# Patient Record
Sex: Female | Born: 1945 | Race: Black or African American | Hispanic: No | Smoking: Never smoker
Health system: Southern US, Community
[De-identification: ages and names within clinical notes are randomized; demographics above are authoritative.]

## PROBLEM LIST (undated history)

## (undated) DIAGNOSIS — J329 Chronic sinusitis, unspecified: Secondary | ICD-10-CM

## (undated) DIAGNOSIS — H409 Unspecified glaucoma: Secondary | ICD-10-CM

## (undated) DIAGNOSIS — J45909 Unspecified asthma, uncomplicated: Secondary | ICD-10-CM

## (undated) DIAGNOSIS — M1990 Unspecified osteoarthritis, unspecified site: Secondary | ICD-10-CM

## (undated) DIAGNOSIS — E785 Hyperlipidemia, unspecified: Secondary | ICD-10-CM

## (undated) DIAGNOSIS — K589 Irritable bowel syndrome without diarrhea: Secondary | ICD-10-CM

## (undated) DIAGNOSIS — I10 Essential (primary) hypertension: Secondary | ICD-10-CM

## (undated) DIAGNOSIS — M810 Age-related osteoporosis without current pathological fracture: Secondary | ICD-10-CM

## (undated) HISTORY — DX: Age-related osteoporosis without current pathological fracture: M81.0

## (undated) HISTORY — PX: TUBAL LIGATION: SHX77

## (undated) HISTORY — DX: Unspecified glaucoma: H40.9

## (undated) HISTORY — DX: Unspecified asthma, uncomplicated: J45.909

## (undated) HISTORY — PX: NASAL SINUS SURGERY: SHX719

## (undated) HISTORY — PX: OTHER SURGICAL HISTORY: SHX169

## (undated) HISTORY — DX: Unspecified osteoarthritis, unspecified site: M19.90

## (undated) HISTORY — DX: Irritable bowel syndrome, unspecified: K58.9

## (undated) HISTORY — PX: CATARACT EXTRACTION: SUR2

## (undated) HISTORY — DX: Hyperlipidemia, unspecified: E78.5

---

## 1999-03-20 DIAGNOSIS — K589 Irritable bowel syndrome without diarrhea: Secondary | ICD-10-CM | POA: Insufficient documentation

## 2002-01-26 DIAGNOSIS — I7300 Raynaud's syndrome without gangrene: Secondary | ICD-10-CM | POA: Insufficient documentation

## 2003-01-08 DIAGNOSIS — F32A Depression, unspecified: Secondary | ICD-10-CM | POA: Insufficient documentation

## 2006-10-02 ENCOUNTER — Ambulatory Visit: Payer: Self-pay | Admitting: Family Medicine

## 2006-10-02 ENCOUNTER — Observation Stay (HOSPITAL_COMMUNITY): Admission: EM | Admit: 2006-10-02 | Discharge: 2006-10-03 | Payer: Self-pay | Admitting: Emergency Medicine

## 2008-01-01 DIAGNOSIS — J45909 Unspecified asthma, uncomplicated: Secondary | ICD-10-CM | POA: Insufficient documentation

## 2010-06-23 NOTE — H&P (Signed)
NAMEMARZETTA, LANZA              ACCOUNT NO.:  [id number]   MEDICAL RECORD NO.:  [id number]          PATIENT TYPE:  OBV   LOCATION:  5727                         FACILITY:  [facility]   PHYSICIAN:  Pearlean Brownie, M.D.DATE OF BIRTH:  1945-03-12   DATE OF ADMISSION:  10/02/2006  DATE OF DISCHARGE:                              HISTORY & PHYSICAL   CHIEF COMPLAINT:  Cough.   HISTORY OF PRESENT ILLNESS:  Patient is a 65 year old female with cough  since June now with fever and a left lower lobe pneumonia.  Patient  complains of excruciating pain this morning on the left lower side and  back.  Yesterday, took Advil and pain was relieved, but the pain has  returned.  She has also been taking Mucinex, Zyrtec, Flonase and  albuterol.  Pain today is not relieved by medications.  She also  complains of headache across her face and touchiness which she  believes to be sinusitis.  This is worse with leaning forward.  Patient  also complains of sore throat and raspy voice.  She denies nausea and  vomiting.  She is taking good p.o. fluids.  The cough has worsened in  the past few weeks.  She was initially coughing up clear sputum which  then became green and then became a brownish red.  She did have a brief  episode of shortness of breath last week which has now resolved.  She  reports heavy breathing this a.m., but no shortness of breath.  She is  not using her albuterol more than usual.  Patient also complains of  recent constipation/diarrhea over the past few weeks as well as some  chest tightness from coughing.  She denies subjective fever (however was  febrile to 103 at [facility] prior to arriving at the [facility]).  At [facility], she was diagnosed with a left lower lobe pneumonia, but was  not hypoxic.  She has a history of anemia and has a hematology  appointment already scheduled in the outpatient setting.  Her primary  care Fayne Mcguffee is currently evaluating her fatigue.   PAST  MEDICAL HISTORY:  Includes:  1. Irritable bowel syndrome.  2. Question of hypertension.  3. Chronic sinusitis.  4. Question of diverticulitis.  5. Asthma.  6. Allergic rhinitis.  Sees an ENT for her sinus issues.   MEDICATIONS:  Include:  1. An allergy shot which she receives weekly.  2. Zyrtec.  3. Nasonex.  4. Albuterol.  5. Hydrochlorothiazide 25 mg by mouth daily.  6. Mucinex.   ALLERGIES:  INCLUDE SULFA MEDICATIONS, ZITHROMAX, AVELOX.   PAST SURGICAL HISTORY:  Includes nose surgery and a bilateral tubal  ligation.   SOCIAL HISTORY:  Patient is a nonsmoker, drinks alcohol socially, does  not use recreational drugs.   REVIEW OF SYSTEMS:  Positive for a weight gain of 12 pounds over the  past few months and abdominal pain consistent with her IBS symptoms.  She also complains of night sweats, blood in her stool, and decreased  energy for the past few months.   PHYSICAL EXAMINATION:  VITAL SIGNS:  Current temperature  99.7, heart  rate 92, respiratory rate 20, blood pressure 108/67, O2 sats 97% on room  air.  GENERAL:  She is in no acute distress and is well appearing.  HEENT:  PERRLA, EOMI.  Throat with mild erythema, no exudate.  There is  nasal drainage visible in the posterior pharynx.  Moist mucous  membranes.  NECK:  No lymphadenopathy.  No thyromegaly.  CARDIOVASCULAR:  Regular rate and rhythm.  S1 and S2.  No rubs, gallops  or murmurs appreciated.  PULMONARY:  Positive for rhonchi and crackles and decreased air movement  in the left lower lobe.  Question faint crackles in right lower lobe.  Patient does not have increased work of breathing and no wheezes.  ABDOMEN:  Soft.  Mildly tender to palpation diffusely.  BACK EXAM:  Positive CVA tenderness to palpation on the left, not on the  right.  EXTREMITIES:  Without edema.  2+ pulses.  NEUROLOGIC EXAM:  Cranial nerves II-XII are intact.  She is oriented x3.  MUSCULOSKELETAL:  5/5 strength in upper and lower  extremities  bilaterally.  SKIN:  Fading contusions on right lower extremity.  Per patient, these  are from antibiotics she recently took.  No rashes or lesions elsewhere.  GU EXAM:  Negative for hemorrhoids and she is fecal occult blood-  negative.   Labs from [facility] include a white count of 15.6, hemoglobin  9.7, hematocrit 29, platelets 346 with 84% neutrophils, MCV of 86.7,  RBCs of 3.35.   A chest x-ray from [place] showed a left lower lobe pneumonia.   ASSESSMENT/PLAN:  A 65 year old female with left lower lobe pneumonia.   1. Pneumonia diagnosed via x-ray at urgent care, given Rocephin IM at      11:30 a.m.  Will continue IV Rocephin in a.m.  Per urgent care,      will obtain blood cultures.  Repeat CBC in a.m., B-Met for lytes.      No need to repeat x-ray.  Will continue home meds including      albuterol, Zyrtec and Nasonex.  Will add Robitussin-DM for symptom      management.  No need for oxygen at this point.  Will likely      transitioned to p.o. meds tomorrow.  Will get cardiac enzymes x1      due to chest tightness and the patient's HPI.  Will give Tylenol      p.r.n.  2. CVA tenderness to palpation on left.  Will get urinalysis, urine      culture and Gram-stain.  Could also be due to patient's pneumonia      which is also on the left.  3. Chronic sinusitis.  Will continue home meds.  4. Question hypertension.  Will continue home hydrochlorothiazide.  5. Anemia.  Fecal occult blood-negative.  Patient has outpatient      appointment with hematologist already set up due to increased white      blood cells and questionable anemia.  Will hold further workup at      this time.  6. Disposition.  Likely discharge tomorrow if doing well.      Johney Maine, M.D.  Electronically Signed      Pearlean Brownie, M.D.  Electronically Signed    JT/MEDQ  D:  10/03/2006  T:  10/03/2006  Job:  [id number]

## 2010-06-26 NOTE — Discharge Summary (Signed)
NAMEMARIETA, [name]              ACCOUNT NO.:  [id number]   MEDICAL RECORD NO.:  [id number]          PATIENT TYPE:  OBV   LOCATION:  5727                         FACILITY:  [facility]   PHYSICIAN:  Leighton Roach McDiarmid, M.D.DATE OF BIRTH:  10-08-1945   DATE OF ADMISSION:  10/02/2006  DATE OF DISCHARGE:  10/03/2006                               DISCHARGE SUMMARY   PRIMARY CARE PHYSICIAN:  Primary care physician is in [place], where  the patient resides, name unknown.   REASON FOR ADMISSION:  Pneumonia.   PERTINENT LABS ON ADMISSION:  White count 15.6 with an ANC of 13.5,  hemoglobin 9.7, hematocrit 27, platelets 346.   PERTINENT STUDIES ON ADMISSION:  Chest x-ray showed a left lower lobe  pneumonia.   DISCHARGE DIAGNOSES:  1. Pneumonia.  2. Chronic sinusitis.  3. Hypertension.   PERTINENT LABS ON DISCHARGE:  White count of 16.7, hemoglobin 8.9,  hematocrit 25.3, platelets 327.  A urinalysis that showed moderate  blood; however, upon microscopy no red blood cells were identified.  She  was fecal occult blood negative.   DISCHARGE MEDICATIONS:  The following home medications were continued  without change:  1. Hydrochlorothiazide 25 mg once daily by mouth.  2. Over-the-counter Mucinex.  3. Over-the-counter Robitussin DM.  4. Zyrtec 10 mg by mouth daily.  5. Dicyclomine 20 mg by mouth 4 times daily.  6. Patient also takes garlic, fish oil, and flax seed oil as      nutritional supplements.  7. Flonase 1-2 sprays per nostril daily.   The following is a new medication, which was begun upon discharge:  Doxycycline 100 mg twice daily by mouth times 1 week.   HOSPITAL COURSE:  1. The patient was admitted after pneumonia was diagnosed on chest x-      ray with a history of 2 weeks of fatigue, malaise, cough, and left      back pain.  This back pain was in the region of the costovertebral      angle, and was originally a concern for pyelonephritis; however,      the patient's  urinalysis showed no indication of infection in her      urine.  The patient's pneumonia is in her left lower lobe, which is      also consistent with pleuritic pain in this area of her back.  The      patient was given Rocephin at the urgent care center, where she      presented prior to coming to the [facility] ED.  Given the fact      that she had a history of coughing up first green sputum, then rust      colored sputum and had allergies to Avelox, Zithromax and sulfa      drugs, doxycycline was chosen as outpatient oral medication for her      pneumonia.  As the patient had no need for IV antibiotics, was not      requiring IV hydration nor oxygen therapy, it was felt she could      recuperate at home given close follow up  with her primary care      [name] in [place], [place] at [facility].  2. Chronic sinusitis.  This issue was not particularly dealt with      during this 24-hour admission.  She is followed by an ENT in      [place], [place] and will continue on her regimen per their      recommendations.  3. Hypertension.  The patient was not hypertensive on this admission      and was maintained on her home dose of hydrochlorothiazide.   DISPOSITION:  The patient was stable at the time of discharge.  She was  discharged to home in [place], [place].  She will be driven there by  her husband.  She was instructed to arrange follow up with her primary  care physician within 2 weeks of discharge should she not feel better  and within 6 weeks of discharge if after taking the antibiotic she felt  much better, given the fact that it can sometimes take up to 6 weeks for  the chest x-ray to clear from a resolved pneumonia.   FOLLOW UP ISSUES:  Pneumonia.  Patient was given p.o. antibiotics for  what we believed to be the most likely etiology for her pneumonia, that  is Strep pneumo.  She was given strict instructions to get in touch with  her primary care [name] were  she not to feel better after her course  of antibiotics within 2 weeks.  This is a patient who has seen her  primary care physician multiple times over the past 2 months, as she has  been feeling poorly, so we had no anxiety that she would arrange follow  up as needed.      [name], M.D.  Electronically Signed      Leighton Roach McDiarmid, M.D.  Electronically Signed    KT/MEDQ  D:  10/06/2006  T:  10/07/2006  Job:  [id number]

## 2010-11-20 LAB — URINE CULTURE
Colony Count: NO GROWTH
Culture: NO GROWTH
Special Requests: NEGATIVE

## 2010-11-20 LAB — BASIC METABOLIC PANEL
BUN: 6
CO2: 25
Calcium: 8.6
Chloride: 99
Creatinine, Ser: 0.68
GFR calc Af Amer: >60
GFR calc non Af Amer: >60
Glucose, Bld: 97
Potassium: 3.3 — ABNORMAL LOW
Sodium: 134 — ABNORMAL LOW

## 2010-11-20 LAB — CBC
HCT: 25.8 — ABNORMAL LOW
Hemoglobin: 8.9 — ABNORMAL LOW
MCV: 88.4
Platelets: 327
RDW: 15.6 — ABNORMAL HIGH
WBC: 16.7 — ABNORMAL HIGH

## 2010-11-20 LAB — URINE MICROSCOPIC-ADD ON

## 2010-11-20 LAB — URINALYSIS, ROUTINE W REFLEX MICROSCOPIC
Bilirubin Urine: NEGATIVE
Ketones, ur: NEGATIVE
Nitrite: NEGATIVE
Specific Gravity, Urine: 1.009
Urobilinogen, UA: 0.2

## 2010-11-20 LAB — OCCULT BLOOD X 1 CARD TO LAB, STOOL: Fecal Occult Bld: NEGATIVE

## 2010-11-20 LAB — CARDIAC PANEL(CRET KIN+CKTOT+MB+TROPI)
CK, MB: 1.2
Relative Index: INVALID
Troponin I: 0.03

## 2010-11-20 LAB — CULTURE, BLOOD (ROUTINE X 2): Culture: NO GROWTH

## 2010-11-20 LAB — GRAM STAIN

## 2013-10-30 DIAGNOSIS — G43909 Migraine, unspecified, not intractable, without status migrainosus: Secondary | ICD-10-CM | POA: Insufficient documentation

## 2014-07-14 ENCOUNTER — Encounter (HOSPITAL_COMMUNITY): Payer: Self-pay | Admitting: Emergency Medicine

## 2014-07-14 ENCOUNTER — Emergency Department (HOSPITAL_COMMUNITY)
Admission: EM | Admit: 2014-07-14 | Discharge: 2014-07-14 | Disposition: A | Discharge status: Home / Self Care | Payer: Medicare HMO | Attending: Emergency Medicine | Admitting: Emergency Medicine

## 2014-07-14 DIAGNOSIS — N390 Urinary tract infection, site not specified: Secondary | ICD-10-CM | POA: Insufficient documentation

## 2014-07-14 DIAGNOSIS — R1030 Lower abdominal pain, unspecified: Secondary | ICD-10-CM | POA: Diagnosis present

## 2014-07-14 DIAGNOSIS — Z79899 Other long term (current) drug therapy: Secondary | ICD-10-CM | POA: Insufficient documentation

## 2014-07-14 DIAGNOSIS — Z9851 Tubal ligation status: Secondary | ICD-10-CM | POA: Insufficient documentation

## 2014-07-14 DIAGNOSIS — Z8709 Personal history of other diseases of the respiratory system: Secondary | ICD-10-CM | POA: Insufficient documentation

## 2014-07-14 DIAGNOSIS — I10 Essential (primary) hypertension: Secondary | ICD-10-CM | POA: Diagnosis not present

## 2014-07-14 DIAGNOSIS — Z7951 Long term (current) use of inhaled steroids: Secondary | ICD-10-CM | POA: Insufficient documentation

## 2014-07-14 HISTORY — DX: Chronic sinusitis, unspecified: J32.9

## 2014-07-14 HISTORY — DX: Essential (primary) hypertension: I10

## 2014-07-14 LAB — URINALYSIS, ROUTINE W REFLEX MICROSCOPIC
Bilirubin Urine: NEGATIVE
GLUCOSE, UA: NEGATIVE mg/dL
Ketones, ur: NEGATIVE mg/dL
Nitrite: NEGATIVE
PROTEIN: NEGATIVE mg/dL
SPECIFIC GRAVITY, URINE: 1.005 (ref 1.005–1.030)
Urobilinogen, UA: 0.2 mg/dL (ref 0.0–1.0)
pH: 6 (ref 5.0–8.0)

## 2014-07-14 LAB — URINE MICROSCOPIC-ADD ON

## 2014-07-14 MED ORDER — CEPHALEXIN 500 MG PO CAPS: 500.0000 mg | ORAL_CAPSULE | Freq: Two times a day (BID) | ORAL | Status: DC

## 2014-07-14 MED ORDER — CEPHALEXIN 500 MG PO CAPS
500.0000 mg | ORAL_CAPSULE | Freq: Once | ORAL | Status: AC
  Administered 2014-07-14: 500 mg via ORAL
  Filled 2014-07-14: qty 1

## 2014-07-14 NOTE — Discharge Instructions (Signed)
Urinary Tract Infection Ms. Blankenbeckler, your urine shows an infection. Take antibiotics as prescribed. See a primary care physician within 3 days for close follow-up. If symptoms worsen come back to emergency department immediately. Thank you. A urinary tract infection (UTI) can occur any place along the urinary tract. The tract includes the kidneys, ureters, bladder, and urethra. A type of germ called bacteria often causes a UTI. UTIs are often helped with antibiotic medicine.  HOME CARE   If given, take antibiotics as told by your doctor. Finish them even if you start to feel better.  Drink enough fluids to keep your pee (urine) clear or pale yellow.  Avoid tea, drinks with caffeine, and bubbly (carbonated) drinks.  Pee often. Avoid holding your pee in for a long time.  Pee before and after having sex (intercourse).  Wipe from front to back after you poop (bowel movement) if you are a woman. Use each tissue only once. GET HELP RIGHT AWAY IF:   You have back pain.  You have lower belly (abdominal) pain.  You have chills.  You feel sick to your stomach (nauseous).  You throw up (vomit).  Your burning or discomfort with peeing does not go away.  You have a fever.  Your symptoms are not better in 3 days. MAKE SURE YOU:   Understand these instructions.  Will watch your condition.  Will get help right away if you are not doing well or get worse. Document Released: 07/14/2007 Document Revised: 10/20/2011 Document Reviewed: 08/26/2011 [employer] Patient Information 2015 [employer] [place]. This information is not intended to replace advice given to you by your health care provider. Make sure you discuss any questions you have with your health care provider.

## 2014-07-14 NOTE — ED Provider Notes (Signed)
CSN: [id number]     Arrival date & time 07/14/14  0106 History   First MD Initiated Contact with Patient 07/14/14 [phone]     Chief Complaint  Patient presents with  . Urinary Tract Infection     (Consider location/radiation/quality/duration/timing/severity/associated sxs/prior Treatment) HPI  [name] is a 69 y.o. female with past medical history of hypertension presenting today with urinary symptoms. Patient states her symptoms began earlier yesterday. She has abdominal pressure in her suprapubic region. There is no radiation. She has not taken any medication for this. She states it sounds like her prior UTI. She's had dysuria with no hematuria. She has also had increased frequency. She states her whole body is tingling which is also consistent with her prior urinary infections. She denies fevers. She denies back pain. She has no vaginal complaints. Patient has no further complaints.   10 Systems reviewed and are negative for acute change except as noted in the HPI.    Past Medical History  Diagnosis Date  . Hypertension   . Sinusitis    Past Surgical History  Procedure Laterality Date  . Tubal ligation    . Cataract extraction    . Nasal sinus surgery     Family History  Problem Relation Age of Onset  . Stroke Mother   . Hypertension Mother   . Hypertension Other    History  Substance Use Topics  . Smoking status: Never Smoker   . Smokeless tobacco: Not on file  . Alcohol Use: No   OB History    No data available     Review of Systems    Allergies  Review of patient's allergies indicates no known allergies.  Home Medications   Prior to Admission medications   Medication Sig Start Date End Date Taking? Authorizing Provider  acetaminophen (TYLENOL) 650 MG CR tablet Take 650 mg by mouth every 8 (eight) hours as needed for pain.   Yes Historical Provider, MD  albuterol (PROVENTIL HFA;VENTOLIN HFA) 108 (90 BASE) MCG/ACT inhaler Inhale 2 puffs into the lungs  every 6 (six) hours as needed for wheezing or shortness of breath.   Yes Historical Provider, MD  budesonide (PULMICORT) 0.5 MG/2ML nebulizer solution Take 0.5 mg by nebulization 2 (two) times daily as needed (wheezing).   Yes Historical Provider, MD  cholecalciferol (VITAMIN D) 1000 UNITS tablet Take 1,000 Units by mouth daily.   Yes Historical Provider, MD  dicyclomine (BENTYL) 20 MG tablet Take 20 mg by mouth 2 (two) times daily.  06/10/14  Yes Historical Provider, MD  DUREZOL 0.05 % EMUL Place 1 drop into the right eye 2 (two) times daily.  06/06/14  Yes Historical Provider, MD  fluticasone (FLONASE) 50 MCG/ACT nasal spray Place 1 spray into both nostrils daily.   Yes Historical Provider, MD  losartan-hydrochlorothiazide (HYZAAR) 100-25 MG per tablet Take 1 tablet by mouth daily.  06/26/14  Yes Historical Provider, MD  montelukast (SINGULAIR) 10 MG tablet Take 10 mg by mouth at bedtime.  06/20/14  Yes Historical Provider, MD  omeprazole (PRILOSEC) 40 MG capsule Take 40 mg by mouth daily.  06/19/14  Yes Historical Provider, MD  PROLENSA 0.07 % SOLN Place 1 drop into the right eye daily.  06/06/14  Yes Historical Provider, MD  vitamin B-12 (CYANOCOBALAMIN) 1000 MCG tablet Take 1,000 mcg by mouth daily.   Yes Historical Provider, MD   BP 141/94 mmHg  Pulse 66  Temp(Src) 97.8 F (36.6 C) (Oral)  Resp 18  SpO2 99%  Physical Exam  Constitutional: She is oriented to person, place, and time. She appears well-developed and well-nourished. No distress.  HENT:  Head: Normocephalic and atraumatic.  Nose: Nose normal.  Mouth/Throat: Oropharynx is clear and moist. No oropharyngeal exudate.  Eyes: Conjunctivae and EOM are normal. Pupils are equal, round, and reactive to light. No scleral icterus.  Neck: Normal range of motion. Neck supple. No JVD present. No tracheal deviation present. No thyromegaly present.  Cardiovascular: Normal rate, regular rhythm and normal heart sounds.  Exam reveals no gallop and no  friction rub.   No murmur heard. Pulmonary/Chest: Effort normal and breath sounds normal. No respiratory distress. She has no wheezes. She exhibits no tenderness.  Abdominal: Soft. Bowel sounds are normal. She exhibits no distension and no mass. There is tenderness. There is no rebound and no guarding.  Suprapubic tenderness to palpation.  Musculoskeletal: Normal range of motion. She exhibits no edema or tenderness.  Lymphadenopathy:    She has no cervical adenopathy.  Neurological: She is alert and oriented to person, place, and time. No cranial nerve deficit. She exhibits normal muscle tone.  Skin: Skin is warm and dry. No rash noted. No erythema. No pallor.  Nursing note and vitals reviewed.   ED Course  Procedures (including critical care time) Labs Review Labs Reviewed  URINALYSIS, ROUTINE W REFLEX MICROSCOPIC (NOT AT [facility]) - Abnormal; Notable for the following:    APPearance CLOUDY (*)    Hgb urine dipstick SMALL (*)    Leukocytes, UA LARGE (*)    All other components within normal limits  URINE MICROSCOPIC-ADD ON - Abnormal; Notable for the following:    Bacteria, UA FEW (*)    All other components within normal limits    Imaging Review No results found.   EKG Interpretation None      MDM   Final diagnoses:  None    Patient presents to the ED for symptoms of UTI. Urinalysis does show an infection. She was given Keflex emergency department will be discharged with a prescription. Primary care follow-up is advised in 3 days. She otherwise appears well in no acute distress. Vital signs were within her normal limits and she is safe for discharge.    Tomasita CrumbleAdeleke Constance Hackenberg, MD 07/14/14 [phone]

## 2014-07-14 NOTE — ED Notes (Signed)
Pt states she has a UTI  Pt is c/o dysuria and lower pelvic pressure Pt states she has urinary frequency as well   Pt states sxs started a little after midnight

## 2016-08-26 DIAGNOSIS — M179 Osteoarthritis of knee, unspecified: Secondary | ICD-10-CM | POA: Insufficient documentation

## 2016-08-26 DIAGNOSIS — S83003A Unspecified subluxation of unspecified patella, initial encounter: Secondary | ICD-10-CM | POA: Insufficient documentation

## 2017-04-11 DIAGNOSIS — S335XXA Sprain of ligaments of lumbar spine, initial encounter: Secondary | ICD-10-CM | POA: Insufficient documentation

## 2020-03-28 ENCOUNTER — Other Ambulatory Visit: Payer: Self-pay

## 2020-03-28 ENCOUNTER — Observation Stay (HOSPITAL_COMMUNITY): Payer: Medicare HMO

## 2020-03-28 ENCOUNTER — Inpatient Hospital Stay (HOSPITAL_COMMUNITY)
Admission: EM | Admit: 2020-03-28 | Discharge: 2020-04-03 | DRG: 640 | Disposition: A | Discharge status: Home / Self Care | Payer: Medicare HMO | Attending: Family Medicine | Admitting: Family Medicine

## 2020-03-28 ENCOUNTER — Encounter (HOSPITAL_COMMUNITY): Payer: Self-pay

## 2020-03-28 ENCOUNTER — Emergency Department (HOSPITAL_COMMUNITY): Payer: Medicare HMO

## 2020-03-28 DIAGNOSIS — K580 Irritable bowel syndrome with diarrhea: Secondary | ICD-10-CM | POA: Diagnosis present

## 2020-03-28 DIAGNOSIS — Z634 Disappearance and death of family member: Secondary | ICD-10-CM

## 2020-03-28 DIAGNOSIS — K219 Gastro-esophageal reflux disease without esophagitis: Secondary | ICD-10-CM | POA: Diagnosis present

## 2020-03-28 DIAGNOSIS — R7989 Other specified abnormal findings of blood chemistry: Secondary | ICD-10-CM | POA: Diagnosis present

## 2020-03-28 DIAGNOSIS — R911 Solitary pulmonary nodule: Secondary | ICD-10-CM | POA: Diagnosis present

## 2020-03-28 DIAGNOSIS — F3181 Bipolar II disorder: Secondary | ICD-10-CM | POA: Diagnosis present

## 2020-03-28 DIAGNOSIS — R0781 Pleurodynia: Secondary | ICD-10-CM

## 2020-03-28 DIAGNOSIS — J45909 Unspecified asthma, uncomplicated: Secondary | ICD-10-CM | POA: Diagnosis present

## 2020-03-28 DIAGNOSIS — R451 Restlessness and agitation: Secondary | ICD-10-CM | POA: Diagnosis not present

## 2020-03-28 DIAGNOSIS — R112 Nausea with vomiting, unspecified: Secondary | ICD-10-CM

## 2020-03-28 DIAGNOSIS — Z79899 Other long term (current) drug therapy: Secondary | ICD-10-CM

## 2020-03-28 DIAGNOSIS — Z823 Family history of stroke: Secondary | ICD-10-CM | POA: Diagnosis not present

## 2020-03-28 DIAGNOSIS — K7689 Other specified diseases of liver: Secondary | ICD-10-CM | POA: Diagnosis present

## 2020-03-28 DIAGNOSIS — R12 Heartburn: Secondary | ICD-10-CM | POA: Diagnosis not present

## 2020-03-28 DIAGNOSIS — Z888 Allergy status to other drugs, medicaments and biological substances status: Secondary | ICD-10-CM | POA: Diagnosis not present

## 2020-03-28 DIAGNOSIS — T502X5A Adverse effect of carbonic-anhydrase inhibitors, benzothiadiazides and other diuretics, initial encounter: Secondary | ICD-10-CM | POA: Diagnosis present

## 2020-03-28 DIAGNOSIS — Z7951 Long term (current) use of inhaled steroids: Secondary | ICD-10-CM

## 2020-03-28 DIAGNOSIS — E871 Hypo-osmolality and hyponatremia: Principal | ICD-10-CM | POA: Diagnosis present

## 2020-03-28 DIAGNOSIS — F4329 Adjustment disorder with other symptoms: Secondary | ICD-10-CM | POA: Diagnosis present

## 2020-03-28 DIAGNOSIS — I10 Essential (primary) hypertension: Secondary | ICD-10-CM | POA: Diagnosis present

## 2020-03-28 DIAGNOSIS — F419 Anxiety disorder, unspecified: Secondary | ICD-10-CM | POA: Diagnosis present

## 2020-03-28 DIAGNOSIS — R410 Disorientation, unspecified: Secondary | ICD-10-CM | POA: Diagnosis not present

## 2020-03-28 DIAGNOSIS — R569 Unspecified convulsions: Secondary | ICD-10-CM | POA: Diagnosis not present

## 2020-03-28 DIAGNOSIS — E876 Hypokalemia: Secondary | ICD-10-CM | POA: Diagnosis present

## 2020-03-28 DIAGNOSIS — G928 Other toxic encephalopathy: Secondary | ICD-10-CM | POA: Diagnosis present

## 2020-03-28 DIAGNOSIS — Z20822 Contact with and (suspected) exposure to covid-19: Secondary | ICD-10-CM | POA: Diagnosis present

## 2020-03-28 DIAGNOSIS — I447 Left bundle-branch block, unspecified: Secondary | ICD-10-CM | POA: Diagnosis present

## 2020-03-28 DIAGNOSIS — Z8249 Family history of ischemic heart disease and other diseases of the circulatory system: Secondary | ICD-10-CM

## 2020-03-28 DIAGNOSIS — Z882 Allergy status to sulfonamides status: Secondary | ICD-10-CM

## 2020-03-28 DIAGNOSIS — K449 Diaphragmatic hernia without obstruction or gangrene: Secondary | ICD-10-CM | POA: Diagnosis present

## 2020-03-28 DIAGNOSIS — K5641 Fecal impaction: Secondary | ICD-10-CM | POA: Diagnosis present

## 2020-03-28 DIAGNOSIS — E440 Moderate protein-calorie malnutrition: Secondary | ICD-10-CM | POA: Insufficient documentation

## 2020-03-28 DIAGNOSIS — M546 Pain in thoracic spine: Secondary | ICD-10-CM | POA: Diagnosis not present

## 2020-03-28 DIAGNOSIS — E861 Hypovolemia: Secondary | ICD-10-CM | POA: Diagnosis present

## 2020-03-28 LAB — BASIC METABOLIC PANEL
Anion gap: 12 (ref 5–15)
Anion gap: 14 (ref 5–15)
BUN: 8 mg/dL (ref 8–23)
BUN: 8 mg/dL (ref 8–23)
CO2: 19 mmol/L — ABNORMAL LOW (ref 22–32)
CO2: 21 mmol/L — ABNORMAL LOW (ref 22–32)
Calcium: 8.2 mg/dL — ABNORMAL LOW (ref 8.9–10.3)
Calcium: 8.5 mg/dL — ABNORMAL LOW (ref 8.9–10.3)
Chloride: 77 mmol/L — ABNORMAL LOW (ref 98–111)
Chloride: 80 mmol/L — ABNORMAL LOW (ref 98–111)
Creatinine, Ser: 0.57 mg/dL (ref 0.44–1.00)
Creatinine, Ser: 0.66 mg/dL (ref 0.44–1.00)
GFR, Estimated: >60 mL/min (ref >60)
GFR, Estimated: >60 mL/min (ref >60)
Glucose, Bld: 117 mg/dL — ABNORMAL HIGH (ref 70–99)
Glucose, Bld: 177 mg/dL — ABNORMAL HIGH (ref 70–99)
Potassium: 3.1 mmol/L — ABNORMAL LOW (ref 3.5–5.1)
Potassium: 3.7 mmol/L (ref 3.5–5.1)
Sodium: 110 mmol/L — CL (ref 135–145)
Sodium: 113 mmol/L — CL (ref 135–145)

## 2020-03-28 LAB — CBC WITH DIFFERENTIAL/PLATELET
Abs Immature Granulocytes: 0.03 10*3/uL (ref 0.00–0.07)
Basophils Absolute: 0 10*3/uL (ref 0.0–0.1)
Basophils Relative: 0 %
Eosinophils Absolute: 0 10*3/uL (ref 0.0–0.5)
Eosinophils Relative: 0 %
HCT: 32.2 % — ABNORMAL LOW (ref 36.0–46.0)
Hemoglobin: 12 g/dL (ref 12.0–15.0)
Immature Granulocytes: 0 %
Lymphocytes Relative: 12 %
Lymphs Abs: 1.1 10*3/uL (ref 0.7–4.0)
MCH: 32.5 pg (ref 26.0–34.0)
MCHC: 37.3 g/dL — ABNORMAL HIGH (ref 30.0–36.0)
MCV: 87.3 fL (ref 80.0–100.0)
Monocytes Absolute: 0.6 10*3/uL (ref 0.1–1.0)
Monocytes Relative: 7 %
Neutro Abs: 7.4 10*3/uL (ref 1.7–7.7)
Neutrophils Relative %: 81 %
Platelets: 263 10*3/uL (ref 150–400)
RBC: 3.69 MIL/uL — ABNORMAL LOW (ref 3.87–5.11)
RDW: 12.4 % (ref 11.5–15.5)
WBC: 9.1 10*3/uL (ref 4.0–10.5)
nRBC: 0 % (ref 0.0–0.2)

## 2020-03-28 LAB — COMPREHENSIVE METABOLIC PANEL
ALT: 15 U/L (ref 0–44)
AST: 19 U/L (ref 15–41)
Albumin: 4.1 g/dL (ref 3.5–5.0)
Alkaline Phosphatase: 50 U/L (ref 38–126)
Anion gap: 13 (ref 5–15)
BUN: 9 mg/dL (ref 8–23)
CO2: 23 mmol/L (ref 22–32)
Calcium: 9 mg/dL (ref 8.9–10.3)
Chloride: 77 mmol/L — ABNORMAL LOW (ref 98–111)
Creatinine, Ser: 0.53 mg/dL (ref 0.44–1.00)
GFR, Estimated: >60 mL/min (ref >60)
Glucose, Bld: 122 mg/dL — ABNORMAL HIGH (ref 70–99)
Potassium: 3.3 mmol/L — ABNORMAL LOW (ref 3.5–5.1)
Sodium: 113 mmol/L — CL (ref 135–145)
Total Bilirubin: 1 mg/dL (ref 0.3–1.2)
Total Protein: 7.4 g/dL (ref 6.5–8.1)

## 2020-03-28 LAB — TSH: TSH: 1.709 u[IU]/mL (ref 0.350–4.500)

## 2020-03-28 LAB — CK: Total CK: 140 U/L (ref 38–234)

## 2020-03-28 LAB — TROPONIN I (HIGH SENSITIVITY)
Troponin I (High Sensitivity): 8 ng/L (ref <18)
Troponin I (High Sensitivity): 21 ng/L — ABNORMAL HIGH (ref <18)

## 2020-03-28 LAB — AMMONIA: Ammonia: 21 umol/L (ref 9–35)

## 2020-03-28 MED ORDER — ARFORMOTEROL TARTRATE 15 MCG/2ML IN NEBU
15.0000 ug | INHALATION_SOLUTION | Freq: Two times a day (BID) | RESPIRATORY_TRACT | Status: DC
  Administered 2020-03-29 – 2020-04-03 (×9): 15 ug via RESPIRATORY_TRACT
  Filled 2020-03-28 (×13): qty 2

## 2020-03-28 MED ORDER — POLYETHYLENE GLYCOL 3350 17 G PO PACK: 17.0000 g | PACK | Freq: Every day | ORAL | Status: DC | PRN

## 2020-03-28 MED ORDER — LIDOCAINE VISCOUS HCL 2 % MT SOLN
15.0000 mL | Freq: Once | OROMUCOSAL | Status: AC
  Administered 2020-03-28: 15 mL via ORAL
  Filled 2020-03-28: qty 15

## 2020-03-28 MED ORDER — METOCLOPRAMIDE HCL 5 MG/ML IJ SOLN
10.0000 mg | Freq: Once | INTRAMUSCULAR | Status: AC
  Administered 2020-03-28: 10 mg via INTRAVENOUS
  Filled 2020-03-28: qty 2

## 2020-03-28 MED ORDER — SODIUM CHLORIDE 3 % IV BOLUS
100.0000 mL | Freq: Once | INTRAVENOUS | Status: AC
  Administered 2020-03-28: 100 mL via INTRAVENOUS
  Filled 2020-03-28: qty 500

## 2020-03-28 MED ORDER — ALUM & MAG HYDROXIDE-SIMETH 200-200-20 MG/5ML PO SUSP
30.0000 mL | Freq: Once | ORAL | Status: AC
  Administered 2020-03-28: 30 mL via ORAL
  Filled 2020-03-28: qty 30

## 2020-03-28 MED ORDER — SODIUM CHLORIDE 0.9 % IV BOLUS
1000.0000 mL | Freq: Once | INTRAVENOUS | Status: AC
  Administered 2020-03-28: 1000 mL via INTRAVENOUS

## 2020-03-28 MED ORDER — MORPHINE SULFATE (PF) 4 MG/ML IV SOLN
4.0000 mg | Freq: Once | INTRAVENOUS | Status: AC
  Administered 2020-03-28: 4 mg via INTRAVENOUS
  Filled 2020-03-28: qty 1

## 2020-03-28 MED ORDER — SODIUM CHLORIDE 3 % IV BOLUS
100.0000 mL | Freq: Once | INTRAVENOUS | Status: DC
  Administered 2020-03-28: 100 mL via INTRAVENOUS
  Filled 2020-03-28: qty 500

## 2020-03-28 MED ORDER — NALOXONE HCL 2 MG/2ML IJ SOSY
PREFILLED_SYRINGE | INTRAMUSCULAR | Status: AC
  Administered 2020-03-28: 0.4 mg
  Filled 2020-03-28: qty 2

## 2020-03-28 MED ORDER — SODIUM CHLORIDE 3 % IV BOLUS
250.0000 mL | Freq: Once | INTRAVENOUS | Status: DC
  Administered 2020-03-28: 250 mL via INTRAVENOUS
  Filled 2020-03-28: qty 500

## 2020-03-28 MED ORDER — PANTOPRAZOLE SODIUM 40 MG PO TBEC
40.0000 mg | DELAYED_RELEASE_TABLET | Freq: Every day | ORAL | Status: DC
  Administered 2020-03-29 – 2020-04-03 (×6): 40 mg via ORAL
  Filled 2020-03-28 (×6): qty 1

## 2020-03-28 MED ORDER — THIAMINE HCL 100 MG/ML IJ SOLN
100.0000 mg | Freq: Every day | INTRAMUSCULAR | Status: DC
  Administered 2020-03-28 – 2020-03-31 (×4): 100 mg via INTRAVENOUS
  Filled 2020-03-28 (×4): qty 2

## 2020-03-28 MED ORDER — ONDANSETRON HCL 4 MG/2ML IJ SOLN
4.0000 mg | Freq: Once | INTRAMUSCULAR | Status: AC
  Administered 2020-03-28: 4 mg via INTRAVENOUS
  Filled 2020-03-28: qty 2

## 2020-03-28 MED ORDER — IOHEXOL 300 MG/ML  SOLN
100.0000 mL | Freq: Once | INTRAMUSCULAR | Status: AC | PRN
  Administered 2020-03-28: 100 mL via INTRAVENOUS

## 2020-03-28 MED ORDER — BUDESONIDE 0.25 MG/2ML IN SUSP
0.2500 mg | Freq: Two times a day (BID) | RESPIRATORY_TRACT | Status: DC
  Administered 2020-03-29 – 2020-04-03 (×9): 0.25 mg via RESPIRATORY_TRACT
  Filled 2020-03-28 (×10): qty 2

## 2020-03-28 MED ORDER — DOCUSATE SODIUM 100 MG PO CAPS: 100.0000 mg | ORAL_CAPSULE | Freq: Two times a day (BID) | ORAL | Status: DC | PRN

## 2020-03-28 NOTE — ED Triage Notes (Signed)
Pt BIBA from home-  Per EMS- c/o feeling "bloated, pressure on bladder, rectal pain due frequent BM's" Pt reports frequent urination and bowel movements today.

## 2020-03-28 NOTE — H&P (Addendum)
NAME:  [name], MRN:  [id number], DOB:  30-Aug-1945, LOS: 0 ADMISSION DATE:  03/28/2020, CONSULTATION DATE:  03/28/20 REFERRING MD:  Allena Katz, CHIEF COMPLAINT:  Severe symptomatic hyponatremia  Brief History   74yF with severe symptomatic hyponatremia (witnessed seizure like activity in ED)  History of present illness   75 year old female with history of asthma followed by [facility] in [facility] presenting today complaining of abdominal pain and rectal pain in the setting of worsening constipation. She has IBS with diarrhea but stools have been harder to pass over the last week. She has also had progressive nausea and a couple bouts of emesis on the day of admission.   In ED she was found to have likely stool impaction and severe hyponatremia. SHe was given saline bolus, zofran, and then reglan 10 mg IV at 10pm. At 10:30pm she developed whole body shaking per son which lasted perhaps 30 seconds.   Past Medical History  Asthma HTN GERD  Significant Hospital Events   03/28/20 seizure like activity which broke after 30s spontaneously in [facility] ED  Consults:  PCCM Neuro  Procedures:  None  Significant Diagnostic Tests:  Na 113  Micro Data:  None  Antimicrobials:  None  Interim history/subjective:  n/a  Objective   Blood pressure (!) 166/74, pulse 83, temperature 98 F (36.7 C), temperature source Oral, resp. rate 15, SpO2 97 %.       No intake or output data in the 24 hours ending 03/28/20 2231 There were no vitals filed for this visit.  Examination: General: agitated, repeating 'I feel like i'm gonna be sick' HENT: NCAT, dry MM Lungs: CTAB, normal work of breathing Cardiovascular: RRR, no murmur, no JVD  Abdomen: soft, nontender, normal bowel sounds Extremities: warm, well-perfused without cyanosis, edema Neuro: grossly nonfocal, does not follow commands  Resolved Hospital Problem list   n/a  Assessment & Plan:   # Seizure-like activity # Acute  encephalopathy: likely TME from hyponatremia  - CTH stat - mgmt of hyponatremia as below - neuro was contacted in ED, they will evaluate if recurrence, especially if recurrent despite improvement in electrolyte abnormalities   # Symptomatic severe hyponatremia: with seizure like activity x30sec in ED which broke spontaneously. History, exam, and bedside US suggestive of hypovolemia. Poor solute intake and lots of fluids at home as well since she's been trying a cleanout for constipation. - q2 sodium checks initially after hypertonic 3% 100cc bolus - finish NS bolus - urine electrolytes, osm, tsh, cortisol in AM  # HTN - holding losartan-hctz in setting hyponatremia - holding clonidine for now, reassess over course of night  # asthma - brovana/pulmicort - singulair  # gerd - home ppi  Best practice:  Diet: NPO Pain/Anxiety/Delirium protocol (if indicated): not indicated VAP protocol (if indicated): not indicated DVT prophylaxis: SCDs until CT CHead GI prophylaxis: home ppi Glucose control: follow glucose with lab checks, not diabetic Mobility: bed level Code Status: Full Family Communication: updated at bedside Disposition: ICU  Labs   CBC: Recent Labs  Lab 03/28/20 1647  WBC 9.1  NEUTROABS 7.4  HGB 12.0  HCT 32.2*  MCV 87.3  PLT 263    Basic Metabolic Panel: Recent Labs  Lab 03/28/20 1647  NA 113*  K 3.3*  CL 77*  CO2 23  GLUCOSE 122*  BUN 9  CREATININE 0.53  CALCIUM 9.0   GFR: CrCl cannot be calculated (Unknown ideal weight.). Recent Labs  Lab 03/28/20 1647  WBC 9.1  Liver Function Tests: Recent Labs  Lab 03/28/20 1647  AST 19  ALT 15  ALKPHOS 50  BILITOT 1.0  PROT 7.4  ALBUMIN 4.1   No results for input(s): LIPASE, AMYLASE in the last 168 hours. No results for input(s): AMMONIA in the last 168 hours.  ABG No results found for: PHART, PCO2ART, PO2ART, HCO3, TCO2, ACIDBASEDEF, O2SAT   Coagulation Profile: No results for input(s):  INR, PROTIME in the last 168 hours.  Cardiac Enzymes: No results for input(s): CKTOTAL, CKMB, CKMBINDEX, TROPONINI in the last 168 hours.  HbA1C: No results found for: HGBA1C  CBG: No results for input(s): GLUCAP in the last 168 hours.  Review of Systems:   unable to obtain in setting of acute encephalopathy  Past Medical History  She,  has a past medical history of Hypertension and Sinusitis.   Surgical History    Past Surgical History:  Procedure Laterality Date  . CATARACT EXTRACTION    . NASAL SINUS SURGERY    . TUBAL LIGATION       Social History   reports that she has never smoked. She does not have any smokeless tobacco history on file. She reports that she does not drink alcohol and does not use drugs.   Family History   Her family history includes Hypertension in her mother and another family member; Stroke in her mother.   Allergies No Known Allergies   Home Medications  Prior to Admission medications   Medication Sig Start Date End Date Taking? Authorizing Provider  acetaminophen (TYLENOL) 650 MG CR tablet Take 650 mg by mouth every 8 (eight) hours as needed for pain.    [provider]  albuterol (PROVENTIL HFA;VENTOLIN HFA) 108 (90 BASE) MCG/ACT inhaler Inhale 2 puffs into the lungs every 6 (six) hours as needed for wheezing or shortness of breath.    [provider]  budesonide (PULMICORT) 0.5 MG/2ML nebulizer solution Take 0.5 mg by nebulization 2 (two) times daily as needed (wheezing).    [provider]  cephALEXin (KEFLEX) 500 MG capsule Take 1 capsule (500 mg total) by mouth 2 (two) times daily. 07/14/14   Tomasita Crumble, MD  cholecalciferol (VITAMIN D) 1000 UNITS tablet Take 1,000 Units by mouth daily.    [provider]  dicyclomine (BENTYL) 20 MG tablet Take 20 mg by mouth 2 (two) times daily.  06/10/14   [provider]  DUREZOL 0.05 % EMUL Place 1 drop into the right eye 2 (two) times daily.  06/06/14    [provider]  fluticasone (FLONASE) 50 MCG/ACT nasal spray Place 1 spray into both nostrils daily.    [provider]  losartan-hydrochlorothiazide (HYZAAR) 100-25 MG per tablet Take 1 tablet by mouth daily.  06/26/14   [provider]  montelukast (SINGULAIR) 10 MG tablet Take 10 mg by mouth at bedtime.  06/20/14   [provider]  omeprazole (PRILOSEC) 40 MG capsule Take 40 mg by mouth daily.  06/19/14   [provider]  PROLENSA 0.07 % SOLN Place 1 drop into the right eye daily.  06/06/14   [provider]  vitamin B-12 (CYANOCOBALAMIN) 1000 MCG tablet Take 1,000 mcg by mouth daily.    [provider]     Critical care time: 30 minutes    This patient is critically ill with severe hyponatremia and seizure-like activity; which, requires frequent high complexity decision making, assessment, support, evaluation, and titration of therapies. This was completed through the application of advanced monitoring  technologies and extensive interpretation of multiple databases. During this encounter critical care time was devoted to patient care services described in this note for 30 minutes.  Vida Roller, Pulmonary/Critical Care

## 2020-03-28 NOTE — ED Notes (Signed)
Patient is at CT Scan 

## 2020-03-28 NOTE — ED Notes (Signed)
This nurse called to room by patient's son. He said she had just swallowed some water. Patient was seen with her upper body jerking up and down slightly and then quickly stopped and patient did not respond to voice. Pt was placed on 15L NRB due to O2 dropping. Dr. Anitra Lauth was notified and came to assess the patient. Pt was noted to have had a possible seizure.  At this time patient is becoming more alert, opening eyes, and moving around.

## 2020-03-28 NOTE — ED Notes (Signed)
Pt to CT

## 2020-03-28 NOTE — ED Notes (Signed)
Patient assisted to bedside toilet

## 2020-03-28 NOTE — ED Provider Notes (Addendum)
[facility]-EMERGENCY DEPT Provider Note   CSN: [id number] Arrival date & time: 03/28/20  1615     History Chief Complaint  Patient presents with  . Abdominal Pain    [name] is a 75 y.o. female.  Patient is a 75 year old female presenting today complaining of abdominal pain and rectal pain.  [name] reports that on Monday [name] called her doctor because [name] was having abdominal pain and they recommended that if [name] continued to have pain [name] needs to go to the emergency room.  [name] normally has diarrhea with her IBS but reports over the last week her stools have been very hard and [name] has been able to pass some of the stool but still feels like there is stool that is not coming out.  Today after waking up [name] started having pain in her suprapubic area and it is not going away.  [name] also started feeling nauseated on the way over here and has had one episode of emesis.  [name] denies any significant abdominal surgeries.  No prior GI bleeding.  [name] has not had fever, cough or shortness of breath.  The history is provided by the patient.  Abdominal Pain Pain location:  Suprapubic (Rectal) Pain quality: aching, cramping and gnawing   Pain radiates to:  Does not radiate Pain severity:  Moderate Onset quality:  Gradual Duration: Today. Timing:  Constant Progression:  Worsening Chronicity:  Recurrent Context comment:  Patient reports history of constipation/diarrhea from IBS Relieved by:  Nothing Worsened by:  Bowel movements Ineffective treatments:  None tried Associated symptoms: anorexia, belching, constipation, nausea and vomiting   Associated symptoms: no cough, no dysuria, no hematuria and no shortness of breath   Risk factors comment:  History of hypertension and IBS      Past Medical History:  Diagnosis Date  . Hypertension   . Sinusitis     There are no problems to display for this patient.   Past Surgical History:  Procedure Laterality Date  .  CATARACT EXTRACTION    . NASAL SINUS SURGERY    . TUBAL LIGATION       OB History   No obstetric history on file.     Family History  Problem Relation Age of Onset  . Stroke Mother   . Hypertension Mother   . Hypertension Other     Social History   Tobacco Use  . Smoking status: Never Smoker  Substance Use Topics  . Alcohol use: No  . Drug use: No    Home Medications Prior to Admission medications   Medication Sig Start Date End Date Taking? Authorizing Provider  acetaminophen (TYLENOL) 650 MG CR tablet Take 650 mg by mouth every 8 (eight) hours as needed for pain.    [provider]  albuterol (PROVENTIL HFA;VENTOLIN HFA) 108 (90 BASE) MCG/ACT inhaler Inhale 2 puffs into the lungs every 6 (six) hours as needed for wheezing or shortness of breath.    [provider]  budesonide (PULMICORT) 0.5 MG/2ML nebulizer solution Take 0.5 mg by nebulization 2 (two) times daily as needed (wheezing).    [provider]  cephALEXin (KEFLEX) 500 MG capsule Take 1 capsule (500 mg total) by mouth 2 (two) times daily. 07/14/14   Tomasita Crumbleni, Adeleke, MD  cholecalciferol (VITAMIN D) 1000 UNITS tablet Take 1,000 Units by mouth daily.    [provider]  dicyclomine (BENTYL) 20 MG tablet Take 20 mg by mouth 2 (two) times daily.  06/10/14  [provider]  DUREZOL 0.05 % EMUL Place 1 drop into the right eye 2 (two) times daily.  06/06/14   [provider]  fluticasone (FLONASE) 50 MCG/ACT nasal spray Place 1 spray into both nostrils daily.    [provider]  losartan-hydrochlorothiazide (HYZAAR) 100-25 MG per tablet Take 1 tablet by mouth daily.  06/26/14   [provider]  montelukast (SINGULAIR) 10 MG tablet Take 10 mg by mouth at bedtime.  06/20/14   [provider]  omeprazole (PRILOSEC) 40 MG capsule Take 40 mg by mouth daily.  06/19/14   [provider]  PROLENSA 0.07 % SOLN Place 1 drop into the right eye daily.   06/06/14   [provider]  vitamin B-12 (CYANOCOBALAMIN) 1000 MCG tablet Take 1,000 mcg by mouth daily.    [provider]    Allergies    Patient has no known allergies.  Review of Systems   Review of Systems  Respiratory: Negative for cough and shortness of breath.   Gastrointestinal: Positive for abdominal pain, anorexia, constipation, nausea and vomiting.  Genitourinary: Negative for dysuria and hematuria.  All other systems reviewed and are negative.   Physical Exam Updated Vital Signs BP 140/76   Pulse 82   Temp (!) 97.5 F (36.4 C)   Resp (!) 23   SpO2 98%   Physical Exam Vitals and nursing note reviewed.  Constitutional:      General: [name] is not in acute distress.    Appearance: [name] is well-developed and well-nourished.     Comments: Appears uncomfortable  HENT:     Head: Normocephalic and atraumatic.     Mouth/Throat:     Mouth: Mucous membranes are moist.  Eyes:     Extraocular Movements: EOM normal.     Pupils: Pupils are equal, round, and reactive to light.  Cardiovascular:     Rate and Rhythm: Normal rate and regular rhythm.     Pulses: Intact distal pulses.     Heart sounds: Normal heart sounds. No murmur heard. No friction rub.  Pulmonary:     Effort: Pulmonary effort is normal.     Breath sounds: Normal breath sounds. No wheezing or rales.  Abdominal:     General: Bowel sounds are normal. There is no distension.     Palpations: Abdomen is soft.     Tenderness: There is abdominal tenderness in the suprapubic area. There is guarding. There is no right CVA tenderness, left CVA tenderness or rebound.  Genitourinary:    Comments: Stool present in the rectum.  Small nontender, nonbleeding noninflamed hemorrhoids externally Musculoskeletal:        General: No tenderness. Normal range of motion.     Comments: No edema  Skin:    General: Skin is warm and dry.     Findings: No rash.  Neurological:     General: No focal deficit present.      Mental Status: [name] is alert and oriented to person, place, and time.     Cranial Nerves: No cranial nerve deficit.  Psychiatric:        Mood and Affect: Mood and affect normal.        Behavior: Behavior normal.     ED Results / Procedures / Treatments   Labs (all labs ordered are listed, but only abnormal results are displayed) Labs Reviewed  CBC WITH DIFFERENTIAL/PLATELET - Abnormal; Notable for the following components:      Result Value   RBC 3.69 (*)  HCT 32.2 (*)    MCHC 37.3 (*)    All other components within normal limits  COMPREHENSIVE METABOLIC PANEL - Abnormal; Notable for the following components:   Sodium 113 (*)    Potassium 3.3 (*)    Chloride 77 (*)    Glucose, Bld 122 (*)    All other components within normal limits  TROPONIN I (HIGH SENSITIVITY)  TROPONIN I (HIGH SENSITIVITY)    EKG EKG Interpretation  Date/Time:  Friday March 28 2020 18:00:46 EST Ventricular Rate:  76 PR Interval:    QRS Duration: 151 QT Interval:  442 QTC Calculation: 497 R Axis:   92 Text Interpretation: Sinus rhythm Right atrial enlargement Left bundle branch block No significant change since last tracing Confirmed by Gwyneth Sprout ([id number]) on 03/28/2020 6:35:29 PM   Radiology CT ABDOMEN PELVIS W CONTRAST  Result Date: 03/28/2020 CLINICAL DATA:  Bloating with rectal pain due to frequent bowel movements. EXAM: CT ABDOMEN AND PELVIS WITH CONTRAST TECHNIQUE: Multidetector CT imaging of the abdomen and pelvis was performed using the standard protocol following bolus administration of intravenous contrast. CONTRAST:  OMNIPAQUE IOHEXOL 300 MG/ML  SOLN COMPARISON:  None. FINDINGS: Lower chest: Mild linear scarring and/or atelectasis is seen within the region of the lingula. Hepatobiliary: A 1.1 cm diameter cystic appearing areas seen within the anteromedial aspect of the right lobe of the liver. An additional 2.1 cm x 1.6 cm cyst is noted within the lateral aspect of the  right lobe. No gallstones, gallbladder wall thickening, or biliary dilatation. Pancreas: And 8 mm x 5 mm x 8 mm focus of parenchymal low attenuation is seen within the body of the pancreas (axial CT image 32, CT series number 2 and coronal reformatted image 56, CT series number 5). Spleen: A 3 mm calcified granuloma is noted within the spleen. Adrenals/Urinary Tract: Adrenal glands are unremarkable. Kidneys are normal, without renal calculi or hydronephrosis. A 1.8 cm x 1.2 cm simple cyst is seen within the medial aspect of the mid left kidney. Bladder is unremarkable. Stomach/Bowel: There is a small hiatal hernia. Appendix appears normal. A moderate to marked amount of stool is seen throughout the large bowel. No evidence of bowel wall thickening, distention, or inflammatory changes. Vascular/Lymphatic: No significant vascular findings are present. No enlarged abdominal or pelvic lymph nodes. Reproductive: Status post hysterectomy. No adnexal masses. Other: There is a 2.0 cm x 2.0 cm fat-containing left-sided para umbilical hernia. No abdominopelvic ascites. Musculoskeletal: Marked severity degenerative changes are seen at the level of L5-S1. IMPRESSION: 1. Small hiatal hernia. 2. Hepatic and left renal cysts. 3. Small fat-containing para umbilical hernia. 4. Marked severity degenerative changes at the level of L5-S1. Electronically Signed   By: Aram Candela M.D.   On: 03/28/2020 21:24    Procedures Procedures   Medications Ordered in ED Medications  ondansetron (ZOFRAN) injection 4 mg (has no administration in time range)    ED Course  I have reviewed the triage vital signs and the nursing notes.  Pertinent labs & imaging results that were available during my care of the patient were reviewed by me and considered in my medical decision making (see chart for details).    MDM Rules/Calculators/A&P                          Elderly female presenting today with abdominal pain, rectal pain and  now nausea vomiting.  On exam patient does have fecal impaction.  Moderate  amount of stool was removed from the rectum but patient still has soft stool present.  Will attempt an enema for further stool removal.  However patient is also having suprapubic discomfort.  This could all be from fecal impaction but want to ensure no evidence of diverticulitis or bowel obstruction. Labs are pending.  Patient given antiemetics.  Enema pending.  6:09 PM Patient was able to have a large bowel movement with some improvement but is still having abdominal pain.  CT is pending.  CBC without acute findings but CMP with hyponatremia of 113 without recent to compare.  Patient denies alcohol use or excessive water intake.  Unclear the cause of her hyponatremia.  [name] is complaining of leg cramps.  Potassium only mildly decreased at 3.3 and anion gap are normal.  Patient was given a saline bolus.  6:52 PM Pt's initial EKG showed LBBB and repeat was similar without old to compare.  Now pt is c/o of chest pain for the first time.  Will get a 3rd EKG but [name] is also having vomiting and recurrent belching and feel may be related to GI cause.  Trop is pending but pt given gi cocktail.  10:00 PM Troponin is normal.  CT shows small hiatal hernia and hepatic cysts but no other acute changes at this time.  EKG is unchanged and continues to be a left bundle branch block.  We will continue IV fluids and admit for further care.   10:28 PM Pt had an episode where [name] became unresponsive.  Son reported [name] was shaking.  On my arrival [name] had a brief episode of lip smacking but then resolved.  Pt is sleepy but breathing on her own.  Now starting to move around but not fully responsive.  Hypertonic saline was ordered.  Critical care was consulted.  They were present in the ER at the time and went to see the patient immediately.  MDM Number of Diagnoses or Management Options   Amount and/or Complexity of Data Reviewed Clinical lab  tests: ordered and reviewed Tests in the radiology section of CPT: ordered and reviewed Tests in the medicine section of CPT: ordered and reviewed Decide to obtain previous medical records or to obtain history from someone other than the patient: yes Obtain history from someone other than the patient: yes Review and summarize past medical records: yes Discuss the patient with other providers: yes Independent visualization of images, tracings, or specimens: yes  Risk of Complications, Morbidity, and/or Mortality Presenting problems: high Diagnostic procedures: moderate Management options: moderate  Patient Progress Patient progress: stable  CRITICAL CARE Performed by: Tiaja Hagan Total critical care time: 40 minutes Critical care time was exclusive of separately billable procedures and treating other patients. Critical care was necessary to treat or prevent imminent or life-threatening deterioration. Critical care was time spent personally by me on the following activities: development of treatment plan with patient and/or surrogate as well as nursing, discussions with consultants, evaluation of patient's response to treatment, examination of patient, obtaining history from patient or surrogate, ordering and performing treatments and interventions, ordering and review of laboratory studies, ordering and review of radiographic studies, pulse oximetry and re-evaluation of patient's condition.   Final Clinical Impression(s) / ED Diagnoses Final diagnoses:  Acute hyponatremia  Non-intractable vomiting with nausea, unspecified vomiting type  Fecal impaction [facility])    Rx / DC Orders ED Discharge Orders    None       Gwyneth Sprout, MD 03/28/20 2202  Gwyneth Sprout, MD 03/28/20 2231

## 2020-03-28 NOTE — ED Notes (Signed)
Patient to CT.

## 2020-03-28 NOTE — ED Notes (Signed)
Pt only able to retain appx 1/3 of enema.  Pt assisted to bsc at her request.

## 2020-03-29 ENCOUNTER — Inpatient Hospital Stay (HOSPITAL_COMMUNITY): Payer: Medicare HMO

## 2020-03-29 DIAGNOSIS — E871 Hypo-osmolality and hyponatremia: Secondary | ICD-10-CM | POA: Diagnosis not present

## 2020-03-29 DIAGNOSIS — R569 Unspecified convulsions: Secondary | ICD-10-CM | POA: Diagnosis not present

## 2020-03-29 DIAGNOSIS — R410 Disorientation, unspecified: Secondary | ICD-10-CM | POA: Diagnosis not present

## 2020-03-29 LAB — URINALYSIS, ROUTINE W REFLEX MICROSCOPIC
Bilirubin Urine: NEGATIVE
Glucose, UA: 100 mg/dL — AB
Ketones, ur: 15 mg/dL — AB
Leukocytes,Ua: NEGATIVE
Nitrite: NEGATIVE
Protein, ur: NEGATIVE mg/dL
Specific Gravity, Urine: 1.01 (ref 1.005–1.030)
pH: 7 (ref 5.0–8.0)

## 2020-03-29 LAB — BASIC METABOLIC PANEL
Anion gap: 11 (ref 5–15)
Anion gap: 11 (ref 5–15)
Anion gap: 11 (ref 5–15)
Anion gap: 7 (ref 5–15)
Anion gap: 9 (ref 5–15)
BUN: 7 mg/dL — ABNORMAL LOW (ref 8–23)
BUN: 7 mg/dL — ABNORMAL LOW (ref 8–23)
BUN: 7 mg/dL — ABNORMAL LOW (ref 8–23)
BUN: 6 mg/dL — ABNORMAL LOW (ref 8–23)
BUN: 6 mg/dL — ABNORMAL LOW (ref 8–23)
CO2: 22 mmol/L (ref 22–32)
CO2: 23 mmol/L (ref 22–32)
CO2: 21 mmol/L — ABNORMAL LOW (ref 22–32)
CO2: 22 mmol/L (ref 22–32)
CO2: 20 mmol/L — ABNORMAL LOW (ref 22–32)
Calcium: 8.2 mg/dL — ABNORMAL LOW (ref 8.9–10.3)
Calcium: 8.5 mg/dL — ABNORMAL LOW (ref 8.9–10.3)
Calcium: 8.1 mg/dL — ABNORMAL LOW (ref 8.9–10.3)
Calcium: 8 mg/dL — ABNORMAL LOW (ref 8.9–10.3)
Calcium: 8 mg/dL — ABNORMAL LOW (ref 8.9–10.3)
Chloride: 80 mmol/L — ABNORMAL LOW (ref 98–111)
Chloride: 81 mmol/L — ABNORMAL LOW (ref 98–111)
Chloride: 84 mmol/L — ABNORMAL LOW (ref 98–111)
Chloride: 89 mmol/L — ABNORMAL LOW (ref 98–111)
Chloride: 91 mmol/L — ABNORMAL LOW (ref 98–111)
Creatinine, Ser: 0.54 mg/dL (ref 0.44–1.00)
Creatinine, Ser: 0.51 mg/dL (ref 0.44–1.00)
Creatinine, Ser: 0.56 mg/dL (ref 0.44–1.00)
Creatinine, Ser: 0.62 mg/dL (ref 0.44–1.00)
Creatinine, Ser: 0.49 mg/dL (ref 0.44–1.00)
GFR, Estimated: >60 mL/min (ref >60)
GFR, Estimated: >60 mL/min (ref >60)
GFR, Estimated: >60 mL/min (ref >60)
GFR, Estimated: >60 mL/min (ref >60)
GFR, Estimated: >60 mL/min (ref >60)
Glucose, Bld: 177 mg/dL — ABNORMAL HIGH (ref 70–99)
Glucose, Bld: 134 mg/dL — ABNORMAL HIGH (ref 70–99)
Glucose, Bld: 111 mg/dL — ABNORMAL HIGH (ref 70–99)
Glucose, Bld: 127 mg/dL — ABNORMAL HIGH (ref 70–99)
Glucose, Bld: 107 mg/dL — ABNORMAL HIGH (ref 70–99)
Potassium: 3 mmol/L — ABNORMAL LOW (ref 3.5–5.1)
Potassium: 3.6 mmol/L (ref 3.5–5.1)
Potassium: 3.9 mmol/L (ref 3.5–5.1)
Potassium: 3.4 mmol/L — ABNORMAL LOW (ref 3.5–5.1)
Potassium: 3.5 mmol/L (ref 3.5–5.1)
Sodium: 113 mmol/L — CL (ref 135–145)
Sodium: 115 mmol/L — CL (ref 135–145)
Sodium: 116 mmol/L — CL (ref 135–145)
Sodium: 118 mmol/L — CL (ref 135–145)
Sodium: 120 mmol/L — ABNORMAL LOW (ref 135–145)

## 2020-03-29 LAB — CBC
HCT: 31.8 % — ABNORMAL LOW (ref 36.0–46.0)
Hemoglobin: 11.9 g/dL — ABNORMAL LOW (ref 12.0–15.0)
MCH: 32.3 pg (ref 26.0–34.0)
MCHC: 37.4 g/dL — ABNORMAL HIGH (ref 30.0–36.0)
MCV: 86.4 fL (ref 80.0–100.0)
Platelets: 243 10*3/uL (ref 150–400)
RBC: 3.68 MIL/uL — ABNORMAL LOW (ref 3.87–5.11)
RDW: 12.2 % (ref 11.5–15.5)
WBC: 10.1 10*3/uL (ref 4.0–10.5)
nRBC: 0 % (ref 0.0–0.2)

## 2020-03-29 LAB — URINALYSIS, MICROSCOPIC (REFLEX)
Bacteria, UA: NONE SEEN
Squamous Epithelial / HPF: NONE SEEN (ref 0–5)
WBC, UA: NONE SEEN WBC/hpf (ref 0–5)

## 2020-03-29 LAB — NA AND K (SODIUM & POTASSIUM), RAND UR
Potassium Urine: 30 mmol/L
Sodium, Ur: 111 mmol/L

## 2020-03-29 LAB — MRSA PCR SCREENING: MRSA by PCR: NEGATIVE

## 2020-03-29 LAB — OSMOLALITY, URINE: Osmolality, Ur: 409 mOsm/kg (ref 300–900)

## 2020-03-29 LAB — OSMOLALITY: Osmolality: 248 mOsm/kg — CL (ref 275–295)

## 2020-03-29 LAB — TROPONIN I (HIGH SENSITIVITY): Troponin I (High Sensitivity): 17 ng/L (ref <18)

## 2020-03-29 LAB — SARS CORONAVIRUS 2 (TAT 6-24 HRS): SARS Coronavirus 2: NEGATIVE

## 2020-03-29 MED ORDER — ACETAMINOPHEN 325 MG PO TABS
650.0000 mg | ORAL_TABLET | ORAL | Status: AC | PRN
  Administered 2020-03-29 – 2020-03-30 (×4): 650 mg via ORAL
  Filled 2020-03-29 (×4): qty 2

## 2020-03-29 MED ORDER — SODIUM CHLORIDE 0.9 % IV SOLN
8.0000 mg | Freq: Four times a day (QID) | INTRAVENOUS | Status: DC | PRN
  Administered 2020-03-29 (×2): 8 mg via INTRAVENOUS
  Filled 2020-03-29 (×3): qty 4

## 2020-03-29 MED ORDER — SODIUM CHLORIDE 0.9 % IV BOLUS
500.0000 mL | Freq: Once | INTRAVENOUS | Status: AC
  Administered 2020-03-29: 500 mL via INTRAVENOUS

## 2020-03-29 MED ORDER — METOCLOPRAMIDE HCL 5 MG/ML IJ SOLN
10.0000 mg | Freq: Once | INTRAMUSCULAR | Status: AC
  Administered 2020-03-29: 10 mg via INTRAVENOUS
  Filled 2020-03-29: qty 2

## 2020-03-29 MED ORDER — CHLORHEXIDINE GLUCONATE CLOTH 2 % EX PADS
6.0000 | MEDICATED_PAD | Freq: Every day | CUTANEOUS | Status: DC
  Administered 2020-03-29 – 2020-04-03 (×6): 6 via TOPICAL

## 2020-03-29 MED ORDER — ONDANSETRON HCL 4 MG/2ML IJ SOLN
4.0000 mg | Freq: Once | INTRAMUSCULAR | Status: AC
  Administered 2020-03-29: 4 mg via INTRAVENOUS
  Filled 2020-03-29: qty 2

## 2020-03-29 MED ORDER — POTASSIUM CHLORIDE 10 MEQ/100ML IV SOLN
10.0000 meq | INTRAVENOUS | Status: AC
  Administered 2020-03-29 (×6): 10 meq via INTRAVENOUS
  Filled 2020-03-29 (×7): qty 100

## 2020-03-29 MED ORDER — DEXMEDETOMIDINE HCL IN NACL 200 MCG/50ML IV SOLN
0.2000 ug/kg/h | INTRAVENOUS | Status: DC
  Administered 2020-03-29: 0.4 ug/kg/h via INTRAVENOUS
  Filled 2020-03-29: qty 50

## 2020-03-29 MED ORDER — SODIUM CHLORIDE 0.9 % IV SOLN: INTRAVENOUS | Status: DC

## 2020-03-29 NOTE — ED Notes (Signed)
Per CT, pt unable to remain calm and still to complete the scan. NP Blount notified.

## 2020-03-29 NOTE — ED Notes (Signed)
ED TO INPATIENT HANDOFF REPORT  Name/Age/Gender [name] 75 y.o. female  Code StaJadene Pierinitatus Orders  (From admission, onward)         Start     Ordered   03/28/20 2308  Full code  Continuous        03/28/20 2309        Code Status History    This patient has a current code status but no historical code status.   Advance Care Planning Activity      Home/SNF/Other Home  Chief Complaint Hyponatremia [E87.1]  Level of Care/Admitting Diagnosis ED Disposition    ED Disposition Condition Comment   Admit  Hospital Area: [facility] [[id number]]  Level of Care: ICU [6]  May admit patient to [facility] or [facility] if equivalent level of care is available:: Yes  Covid Evaluation: Confirmed COVID Negative  Date Laboratory Confirmed COVID Negative: 03/28/2020  Diagnosis: Hyponatremia [161096]  Admitting Physician: Omar Person [[id number]]  Attending Physician: Omar Person [phone]  Estimated length of stay: 5 - 7 days  Certification:: I certify this patient will need inpatient services for at least 2 midnights       Medical History Past Medical History:  Diagnosis Date  . Hypertension   . Sinusitis     Allergies Allergies  Allergen Reactions  . Azithromycin     Other reaction(s): gi distress, GI intolerance, Other  . Levofloxacin Hives and Rash  . Sulfa Antibiotics Hives    Red patches on legs years ago  Red patches on legs years ago      IV Location/Drains/Wounds Patient Lines/Drains/Airways Status    Active Line/Drains/Airways    Name Placement date Placement time Site Days   Peripheral IV 03/28/20 Distal;Left;Posterior Forearm 03/28/20  1712  Forearm  1   Peripheral IV 03/28/20 Right Antecubital 03/28/20  2220  Antecubital  1          Labs/Imaging Results for orders placed or performed during the hospital encounter of 03/28/20 (from the past 48 hour(s))  CBC with Differential/Platelet     Status:  Abnormal   Collection Time: 03/28/20  4:47 PM  Result Value Ref Range   WBC 9.1 4.0 - 10.5 K/uL   RBC 3.69 (L) 3.87 - 5.11 MIL/uL   Hemoglobin 12.0 12.0 - 15.0 g/dL   HCT 29.5 (L) 62.1 - 30.8 %   MCV 87.3 80.0 - 100.0 fL   MCH 32.5 26.0 - 34.0 pg   MCHC 37.3 (H) 30.0 - 36.0 g/dL   RDW 65.7 84.6 - 96.2 %   Platelets 263 150 - 400 K/uL   nRBC 0.0 0.0 - 0.2 %   Neutrophils Relative % 81 %   Neutro Abs 7.4 1.7 - 7.7 K/uL   Lymphocytes Relative 12 %   Lymphs Abs 1.1 0.7 - 4.0 K/uL   Monocytes Relative 7 %   Monocytes Absolute 0.6 0.1 - 1.0 K/uL   Eosinophils Relative 0 %   Eosinophils Absolute 0.0 0.0 - 0.5 K/uL   Basophils Relative 0 %   Basophils Absolute 0.0 0.0 - 0.1 K/uL   Immature Granulocytes 0 %   Abs Immature Granulocytes 0.03 0.00 - 0.07 K/uL    Comment: Performed at [facility], [address], [place], [place] [address]  Comprehensive metabolic panel     Status: Abnormal   Collection Time: 03/28/20  4:47 PM  Result Value Ref Range   Sodium 113 (LL)  135 - 145 mmol/L    Comment: CRITICAL RESULT CALLED TO, READ BACK BY AND VERIFIED WITH: C SIMPSON AT 1753 ON 03/28/2020 BY JPM    Potassium 3.3 (L) 3.5 - 5.1 mmol/L   Chloride 77 (L) 98 - 111 mmol/L   CO2 23 22 - 32 mmol/L   Glucose, Bld 122 (H) 70 - 99 mg/dL    Comment: Glucose reference range applies only to samples taken after fasting for at least 8 hours.   BUN 9 8 - 23 mg/dL   Creatinine, Ser 4.09 0.44 - 1.00 mg/dL   Calcium 9.0 8.9 - 81.1 mg/dL   Total Protein 7.4 6.5 - 8.1 g/dL   Albumin 4.1 3.5 - 5.0 g/dL   AST 19 15 - 41 U/L   ALT 15 0 - 44 U/L   Alkaline Phosphatase 50 38 - 126 U/L   Total Bilirubin 1.0 0.3 - 1.2 mg/dL   GFR, Estimated >91 >47 mL/min    Comment: (NOTE) Calculated using the CKD-EPI Creatinine Equation (2021)    Anion gap 13 5 - 15    Comment: Performed at [facility], [address] [place], [place] [address]  Troponin I (High Sensitivity)      Status: None   Collection Time: 03/28/20  4:47 PM  Result Value Ref Range   Troponin I (High Sensitivity) 8 <18 ng/L    Comment: (NOTE) Elevated high sensitivity troponin I (hsTnI) values and significant  changes across serial measurements may suggest ACS but many other  chronic and acute conditions are known to elevate hsTnI results.  Refer to the "Links" section for chest pain algorithms and additional  guidance. Performed at [facility], [address], [place], [place] [address]   Troponin I (High Sensitivity)     Status: Abnormal   Collection Time: 03/28/20  9:50 PM  Result Value Ref Range   Troponin I (High Sensitivity) 21 (H) <18 ng/L    Comment: (NOTE) Elevated high sensitivity troponin I (hsTnI) values and significant  changes across serial measurements may suggest ACS but many other  chronic and acute conditions are known to elevate hsTnI results.  Refer to the "Links" section for chest pain algorithms and additional  guidance. Performed at [facility], [address], [place], [place] [address]   Basic metabolic panel     Status: Abnormal   Collection Time: 03/28/20  9:50 PM  Result Value Ref Range   Sodium 110 (LL) 135 - 145 mmol/L    Comment: CRITICAL RESULT CALLED TO, READ BACK BY AND VERIFIED WITH: Ovadia Lopp RN 03/28/20 @ 2259 BY P.HENDERSON    Potassium 3.7 3.5 - 5.1 mmol/L   Chloride 77 (L) 98 - 111 mmol/L   CO2 21 (L) 22 - 32 mmol/L   Glucose, Bld 117 (H) 70 - 99 mg/dL    Comment: Glucose reference range applies only to samples taken after fasting for at least 8 hours.   BUN 8 8 - 23 mg/dL   Creatinine, Ser 6.96 0.44 - 1.00 mg/dL   Calcium 8.5 (L) 8.9 - 10.3 mg/dL   GFR, Estimated >29 >52 mL/min    Comment: (NOTE) Calculated using the CKD-EPI Creatinine Equation (2021)    Anion gap 12 5 - 15    Comment: Performed at [facility], [address], [place], [place] [address]  Basic metabolic  panel     Status: Abnormal   Collection Time: 03/28/20 10:13 PM  Result  Value Ref Range   Sodium 113 (LL) 135 - 145 mmol/L    Comment: CRITICAL RESULT CALLED TO, READ BACK BY AND VERIFIED WITH: Sheryn Aldaz RN 03/28/20 @2344  BY P.HENDERSON    Potassium 3.1 (L) 3.5 - 5.1 mmol/L   Chloride 80 (L) 98 - 111 mmol/L   CO2 19 (L) 22 - 32 mmol/L   Glucose, Bld 177 (H) 70 - 99 mg/dL    Comment: Glucose reference range applies only to samples taken after fasting for at least 8 hours.   BUN 8 8 - 23 mg/dL   Creatinine, Ser 0.44 - 1.00 mg/dL   Calcium 8.2 (L) 8.9 - 10.3 mg/dL   GFR, Estimated 8.67 >61 mL/min    Comment: (NOTE) Calculated using the CKD-EPI Creatinine Equation (2021)    Anion gap 14 5 - 15    Comment: Performed at [facility], [address] [place], [place]  CK     Status: None   Collection Time: 03/28/20 10:13 PM  Result Value Ref Range   Total CK 140 38 - 234 U/L    Comment: Performed at [facility], [address] [place], [place]  Ammonia     Status: None   Collection Time: 03/28/20 10:41 PM  Result Value Ref Range   Ammonia 21 9 - 35 umol/L    Comment: Performed at [facility], [address], [place], [place]  TSH     Status: None   Collection Time: 03/28/20 10:41 PM  Result Value Ref Range   TSH 1.709 0.350 - 4.500 uIU/mL    Comment: Performed by a 3rd Generation assay with a functional sensitivity of <=0.01 uIU/mL. Performed at [facility], [address] [place], [place]    DG Chest 2 View  Result Date: 03/28/2020 CLINICAL DATA:  Possible seizure. EXAM: CHEST - 2 VIEW COMPARISON:  October 02, 2006 FINDINGS: Mild, chronic appearing increased lung markings are seen. Mild atelectasis and/or early infiltrate is seen within the left lung base. There is no evidence of a pleural effusion or pneumothorax. The cardiac silhouette is borderline in  size. The visualized skeletal structures are unremarkable. IMPRESSION: Mild left basilar atelectasis and/or early infiltrate. Electronically Signed   By: October 04, 2006 M.D.   On: 03/28/2020 23:53   CT ABDOMEN PELVIS W CONTRAST  Result Date: 03/28/2020 CLINICAL DATA:  Bloating with rectal pain due to frequent bowel movements. EXAM: CT ABDOMEN AND PELVIS WITH CONTRAST TECHNIQUE: Multidetector CT imaging of the abdomen and pelvis was performed using the standard protocol following bolus administration of intravenous contrast. CONTRAST:  03/30/2020 OMNIPAQUE IOHEXOL 300 MG/ML  SOLN COMPARISON:  None. FINDINGS: Lower chest: Mild linear scarring and/or atelectasis is seen within the region of the lingula. Hepatobiliary: A 1.1 cm diameter cystic appearing areas seen within the anteromedial aspect of the right lobe of the liver. An additional 2.1 cm x 1.6 cm cyst is noted within the lateral aspect of the right lobe. No gallstones, gallbladder wall thickening, or biliary dilatation. Pancreas: And 8 mm x 5 mm x 8 mm focus of parenchymal low attenuation is seen within the body of the pancreas (axial CT image 32, CT series number 2 and coronal reformatted image 56, CT series number 5). Spleen: A 3 mm calcified granuloma is noted within the spleen. Adrenals/Urinary Tract: Adrenal glands are unremarkable. Kidneys are normal, without renal calculi or hydronephrosis. A 1.8 cm x 1.2 cm simple  cyst is seen within the medial aspect of the mid left kidney. Bladder is unremarkable. Stomach/Bowel: There is a small hiatal hernia. Appendix appears normal. A moderate to marked amount of stool is seen throughout the large bowel. No evidence of bowel wall thickening, distention, or inflammatory changes. Vascular/Lymphatic: No significant vascular findings are present. No enlarged abdominal or pelvic lymph nodes. Reproductive: Status post hysterectomy. No adnexal masses. Other: There is a 2.0 cm x 2.0 cm fat-containing left-sided para  umbilical hernia. No abdominopelvic ascites. Musculoskeletal: Marked severity degenerative changes are seen at the level of L5-S1. IMPRESSION: 1. Small hiatal hernia. 2. Hepatic and left renal cysts. 3. Small fat-containing para umbilical hernia. 4. Marked severity degenerative changes at the level of L5-S1. Electronically Signed   By: Aram Candela M.D.   On: 03/28/2020 21:24    Pending Labs Unresulted Labs (From admission, onward)          Start     Ordered   03/29/20 0500  CBC  Daily,   R      03/28/20 2309   03/29/20 0100  Basic metabolic panel  Now then every 4 hours,   R (with STAT occurrences)      03/28/20 2304   03/28/20 2241  Osmolality, urine  Once,   STAT        03/28/20 2240   03/28/20 2241  Osmolality  Once,   STAT        03/28/20 2240   03/28/20 2241  Na and K (sodium & potassium), rand urine  Once,   STAT        03/28/20 2240   03/28/20 2241  Urinalysis, Routine w reflex microscopic Urine, Catheterized  Once,   STAT        03/28/20 2240   03/28/20 2232  SARS CORONAVIRUS 2 (TAT 6-24 HRS) Nasopharyngeal Nasopharyngeal Swab  (Tier 3 - Symptomatic/asymptomatic with Precautions)  Once,   STAT       Question Answer Comment  Is this test for diagnosis or screening Screening   Symptomatic for COVID-19 as defined by CDC No   Hospitalized for COVID-19 No   Admitted to ICU for COVID-19 No   Previously tested for COVID-19 No   Resident in a congregate (group) care setting No   Employed in healthcare setting No   Pregnant No   Has patient completed COVID vaccination(s) (2 doses of Pfizer/Moderna 1 dose of [employer]) Yes   Has patient completed COVID Booster / 3rd dose Unknown      03/28/20 2231          Vitals/Pain Today's Vitals   03/29/20 0000 03/29/20 0030 03/29/20 0057 03/29/20 0058  BP: 140/75 (!) 155/64    Pulse: 69 70    Resp: (!) 24 20    Temp:      TempSrc:      SpO2: 91% 93%    Weight:   63.5 kg   PainSc:    Asleep    Isolation  Precautions Airborne and Contact precautions  Medications Medications  docusate sodium (COLACE) capsule 100 mg (has no administration in time range)  polyethylene glycol (MIRALAX / GLYCOLAX) packet 17 g (has no administration in time range)  arformoterol (BROVANA) nebulizer solution 15 mcg (0 mcg Nebulization Hold 03/28/20 2359)  budesonide (PULMICORT) nebulizer solution 0.25 mg (0 mg Nebulization Hold 03/28/20 2359)  pantoprazole (PROTONIX) EC tablet 40 mg (has no administration in time range)  thiamine (B-1) injection 100 mg (100 mg Intravenous Given 03/28/20  2336)  dexmedetomidine (PRECEDEX) 200 MCG/50ML (4 mcg/mL) infusion (has no administration in time range)  ondansetron (ZOFRAN) injection 4 mg (4 mg Intravenous Given 03/28/20 1712)  sodium chloride 0.9 % bolus 1,000 mL (0 mLs Intravenous Stopped 03/29/20 0038)  alum & mag hydroxide-simeth (MAALOX/MYLANTA) 200-200-20 MG/5ML suspension 30 mL (30 mLs Oral Given 03/28/20 1919)    And  lidocaine (XYLOCAINE) 2 % viscous mouth solution 15 mL (15 mLs Oral Given 03/28/20 1919)  iohexol (OMNIPAQUE) 300 MG/ML solution 100 mL (100 mLs Intravenous Contrast Given 03/28/20 2100)  morphine 4 MG/ML injection 4 mg (4 mg Intravenous Given 03/28/20 2151)  metoCLOPramide (REGLAN) injection 10 mg (10 mg Intravenous Given 03/28/20 2153)  naloxone [facility](NARCAN) 2 MG/2ML injection (0.4 mg  Given 03/28/20 2215)  sodium chloride 3% (hypertonic) IV bolus 100 mL (0 mLs Intravenous Stopped 03/28/20 2256)    Mobility walks

## 2020-03-29 NOTE — Progress Notes (Signed)
eLink Physician-Brief Progress Note Patient Name: LYLLIAN GAUSE DOB: 04-22-1945 MRN: [id number]   Date of Service  03/29/2020  HPI/Events of Note  Patient quite agitated, making it impossible to obtain a CT scan of the brain to r/o CVA / ICH  eICU Interventions  Precedex infusion ordered.        Thomasene Lot Shabreka Coulon 03/29/2020, 12:50 AM

## 2020-03-29 NOTE — Progress Notes (Signed)
Pt complaining of chest pain and nausea. MD paged at 0500 and EKG obtained. Pt with emesis x1 and MD paged again for pain and nausea medication. Will continue to monitor. VSS and documented in flowsheet

## 2020-03-29 NOTE — Progress Notes (Incomplete)
Date and time results received: 03/29/20 8:22 AM   Test: Na 115 Critical Value: 115  Name of Provider Notified: Everardo All,   Orders Received? Or Actions Taken?:

## 2020-03-29 NOTE — Progress Notes (Addendum)
NAME:  [name], MRN:  [id number], DOB:  April 07, 1945, LOS: 1 ADMISSION DATE:  03/28/2020, CONSULTATION DATE:  03/28/20 REFERRING MD:  Allena Katz, CHIEF COMPLAINT:  Severe symptomatic hyponatremia  Brief History   75yF with severe symptomatic hyponatremia (witnessed seizure like activity in ED)  History of present illness   75 year old female with history of asthma followed by [facility] presenting today complaining of abdominal pain and rectal pain in the setting of worsening constipation. She has IBS with diarrhea but stools have been harder to pass over the last week. She has also had progressive nausea and a couple bouts of emesis on the day of admission.   In ED she was found to have likely stool impaction and severe hyponatremia. SHe was given saline bolus, zofran, and then reglan 10 mg IV at 10pm. At 10:30pm she developed whole body shaking per son which lasted perhaps 30 seconds.   Past Medical History  Asthma HTN GERD  Significant Hospital Events   03/28/20 seizure like activity which broke after 30s spontaneously in [facility] ED  Consults:  PCCM Neuro  Procedures:  None  Significant Diagnostic Tests:  Na 113  Micro Data:  None  Antimicrobials:  None  Interim history/subjective:  Awake, alert, confused. Oriented only to self. Follows commands. Reports wanting to eat.  Objective   Blood pressure (!) 133/55, pulse 67, temperature 99.3 F (37.4 C), temperature source Axillary, resp. rate 17, weight 61.4 kg, SpO2 95 %.        Intake/Output Summary (Last 24 hours) at 03/29/2020 1517 Last data filed at 03/29/2020 0657 Gross per 24 hour  Intake 1746.33 ml  Output --  Net 1746.33 ml   Filed Weights   03/29/20 0057 03/29/20 0237  Weight: 63.5 kg 61.4 kg   Physical Exam: General: Chronically ill-appearing, no acute distress HENT: [place], AT, OP clear, MMM Eyes: EOMI, no scleral icterus Respiratory: Clear to auscultation bilaterally.  No crackles,  wheezing or rales Cardiovascular: RRR, -M/R/G, no JVD GI: BS+, soft, diffuse mild tenderness to palpation Extremities:-Edema,-tenderness Neuro: AAO x1, CNII-XII grossly intact   Resolved Hospital Problem list   n/a  Assessment & Plan:   # Seizure-like activity # Acute encephalopathy: likely TME from hyponatremia  CT head negative. No further episodes - Neuro checks - mgmt of hyponatremia as below - Medications reviewed. Will discuss with pharmacy regarding restarting home benzos  # Symptomatic severe hyponatremia: with seizure like activity x30sec in ED which broke spontaneously. History, exam, and bedside US suggestive of hypovolemia.  Slowly responsive. Corrected 6 mEQ/L over 12 hours. Improving mental status - S/p hypertonic boluses - Maintenance NS @ 100cc/hr - Given additional 2L NS total today - BMP q4h  # HTN - holding losartan-hctz in setting hyponatremia - holding clonidine for now, reassess over course of night  # asthma - brovana/pulmicort - singulair  # gerd - home ppi  Best practice:  Diet: Advance diet as tolerated Pain/Anxiety/Delirium protocol (if indicated): not indicated VAP protocol (if indicated): not indicated DVT prophylaxis: SCDs  GI prophylaxis: home ppi Glucose control: follow glucose with lab checks, not diabetic Mobility: bed level Code Status: Full Family Communication: updated patient at bedside. Attempted to call son on 2/19. No answer and VM box not set up. Disposition: ICU  Labs   CBC: Recent Labs  Lab 03/28/20 1647 03/29/20 0536  WBC 9.1 10.1  NEUTROABS 7.4  --   HGB 12.0 11.9*  HCT 32.2* 31.8*  MCV 87.3 86.4  PLT  263 243    Basic Metabolic Panel: Recent Labs  Lab 03/28/20 2150 03/28/20 2213 03/29/20 0100 03/29/20 0720 03/29/20 1131  NA 110* 113* 113* 115* 116*  K 3.7 3.1* 3.0* 3.6 3.9  CL 77* 80* 80* 81* 84*  CO2 21* 19* 22 23 21*  GLUCOSE 117* 177* 177* 134* 111*  BUN 8 8 7* 7* 7*  CREATININE 0.57 0.66  0.54 0.51 0.56  CALCIUM 8.5* 8.2* 8.2* 8.5* 8.1*   GFR: CrCl cannot be calculated (Unknown ideal weight.). Recent Labs  Lab 03/28/20 1647 03/29/20 0536  WBC 9.1 10.1    Liver Function Tests: Recent Labs  Lab 03/28/20 1647  AST 19  ALT 15  ALKPHOS 50  BILITOT 1.0  PROT 7.4  ALBUMIN 4.1   No results for input(s): LIPASE, AMYLASE in the last 168 hours. Recent Labs  Lab 03/28/20 2241  AMMONIA 21    ABG No results found for: PHART, PCO2ART, PO2ART, HCO3, TCO2, ACIDBASEDEF, O2SAT   Coagulation Profile: No results for input(s): INR, PROTIME in the last 168 hours.  Cardiac Enzymes: Recent Labs  Lab 03/28/20 2213  CKTOTAL 140    HbA1C: No results found for: HGBA1C  CBG: No results for input(s): GLUCAP in the last 168 hours.  Critical care time: 31 minutes    The patient is critically ill with multiple organ systems failure and requires high complexity decision making for assessment and support, frequent evaluation and titration of therapies, application of advanced monitoring technologies and extensive interpretation of multiple databases.    Mechele Collin, M.D. [facility] Pulmonary/Critical Care Medicine 03/29/2020 3:17 PM   Please see Amion for pager number to reach on-call Pulmonary and Critical Care Team.

## 2020-03-29 NOTE — ED Notes (Signed)
Patient to CT.

## 2020-03-29 NOTE — Progress Notes (Signed)
eLink Physician-Brief Progress Note Patient Name: [name] DOB: 1945/11/03 MRN: [id number]   Date of Service  03/29/2020  HPI/Events of Note  Patient admitted with hyponatremia, obstipation and had an episode of shaking in the ED  Of unclear etiology, CT brain negative for acute findings, patient is nauseated and has abdominal discomfort from her obstipation, CT abdomen was benign.  eICU Interventions  New Patient Evaluation completed, PRN Zofran and Tylenol ordered. Precedex ordered for agitation earlier, to enable imaging studies, but will now be discontinued.        Migdalia Dk 03/29/2020, 6:34 AM

## 2020-03-30 DIAGNOSIS — E871 Hypo-osmolality and hyponatremia: Secondary | ICD-10-CM | POA: Diagnosis not present

## 2020-03-30 LAB — BASIC METABOLIC PANEL
Anion gap: 10 (ref 5–15)
Anion gap: 11 (ref 5–15)
Anion gap: 7 (ref 5–15)
Anion gap: 9 (ref 5–15)
Anion gap: 9 (ref 5–15)
BUN: <5 mg/dL — ABNORMAL LOW (ref 8–23)
BUN: <5 mg/dL — ABNORMAL LOW (ref 8–23)
BUN: <5 mg/dL — ABNORMAL LOW (ref 8–23)
BUN: <5 mg/dL — ABNORMAL LOW (ref 8–23)
BUN: <5 mg/dL — ABNORMAL LOW (ref 8–23)
CO2: 18 mmol/L — ABNORMAL LOW (ref 22–32)
CO2: 21 mmol/L — ABNORMAL LOW (ref 22–32)
CO2: 22 mmol/L (ref 22–32)
CO2: 22 mmol/L (ref 22–32)
CO2: 23 mmol/L (ref 22–32)
Calcium: 7.9 mg/dL — ABNORMAL LOW (ref 8.9–10.3)
Calcium: 8.2 mg/dL — ABNORMAL LOW (ref 8.9–10.3)
Calcium: 8.3 mg/dL — ABNORMAL LOW (ref 8.9–10.3)
Calcium: 8.4 mg/dL — ABNORMAL LOW (ref 8.9–10.3)
Calcium: 8.5 mg/dL — ABNORMAL LOW (ref 8.9–10.3)
Chloride: 91 mmol/L — ABNORMAL LOW (ref 98–111)
Chloride: 93 mmol/L — ABNORMAL LOW (ref 98–111)
Chloride: 93 mmol/L — ABNORMAL LOW (ref 98–111)
Chloride: 94 mmol/L — ABNORMAL LOW (ref 98–111)
Chloride: 95 mmol/L — ABNORMAL LOW (ref 98–111)
Creatinine, Ser: 0.48 mg/dL (ref 0.44–1.00)
Creatinine, Ser: 0.49 mg/dL (ref 0.44–1.00)
Creatinine, Ser: 0.58 mg/dL (ref 0.44–1.00)
Creatinine, Ser: 0.58 mg/dL (ref 0.44–1.00)
Creatinine, Ser: 0.6 mg/dL (ref 0.44–1.00)
GFR, Estimated: >60 mL/min (ref >60)
GFR, Estimated: >60 mL/min (ref >60)
GFR, Estimated: >60 mL/min (ref >60)
GFR, Estimated: >60 mL/min (ref >60)
GFR, Estimated: >60 mL/min (ref >60)
Glucose, Bld: 111 mg/dL — ABNORMAL HIGH (ref 70–99)
Glucose, Bld: 117 mg/dL — ABNORMAL HIGH (ref 70–99)
Glucose, Bld: 117 mg/dL — ABNORMAL HIGH (ref 70–99)
Glucose, Bld: 130 mg/dL — ABNORMAL HIGH (ref 70–99)
Glucose, Bld: 136 mg/dL — ABNORMAL HIGH (ref 70–99)
Potassium: 3.1 mmol/L — ABNORMAL LOW (ref 3.5–5.1)
Potassium: 3.3 mmol/L — ABNORMAL LOW (ref 3.5–5.1)
Potassium: 3.3 mmol/L — ABNORMAL LOW (ref 3.5–5.1)
Potassium: 3.5 mmol/L (ref 3.5–5.1)
Potassium: 3.7 mmol/L (ref 3.5–5.1)
Sodium: 120 mmol/L — ABNORMAL LOW (ref 135–145)
Sodium: 123 mmol/L — ABNORMAL LOW (ref 135–145)
Sodium: 123 mmol/L — ABNORMAL LOW (ref 135–145)
Sodium: 124 mmol/L — ABNORMAL LOW (ref 135–145)
Sodium: 128 mmol/L — ABNORMAL LOW (ref 135–145)

## 2020-03-30 LAB — CBC
HCT: 30.1 % — ABNORMAL LOW (ref 36.0–46.0)
Hemoglobin: 10.6 g/dL — ABNORMAL LOW (ref 12.0–15.0)
MCH: 32.1 pg (ref 26.0–34.0)
MCHC: 35.2 g/dL (ref 30.0–36.0)
MCV: 91.2 fL (ref 80.0–100.0)
Platelets: 223 10*3/uL (ref 150–400)
RBC: 3.3 MIL/uL — ABNORMAL LOW (ref 3.87–5.11)
RDW: 12.7 % (ref 11.5–15.5)
WBC: 8.5 10*3/uL (ref 4.0–10.5)
nRBC: 0 % (ref 0.0–0.2)

## 2020-03-30 MED ORDER — LIP MEDEX EX OINT
TOPICAL_OINTMENT | CUTANEOUS | Status: DC | PRN
  Filled 2020-03-30: qty 7

## 2020-03-30 MED ORDER — FLUOXETINE HCL 10 MG PO CAPS
10.0000 mg | ORAL_CAPSULE | Freq: Every day | ORAL | Status: DC
  Administered 2020-03-30 – 2020-04-02 (×4): 10 mg via ORAL
  Filled 2020-03-30 (×5): qty 1

## 2020-03-30 MED ORDER — POTASSIUM CHLORIDE 10 MEQ/100ML IV SOLN
10.0000 meq | INTRAVENOUS | Status: DC
  Administered 2020-03-30: 10 meq via INTRAVENOUS
  Filled 2020-03-30: qty 100

## 2020-03-30 MED ORDER — POTASSIUM CHLORIDE CRYS ER 20 MEQ PO TBCR
40.0000 meq | EXTENDED_RELEASE_TABLET | ORAL | Status: AC
  Administered 2020-03-30: 40 meq via ORAL
  Filled 2020-03-30: qty 2

## 2020-03-30 MED ORDER — CLONIDINE HCL 0.1 MG PO TABS
0.1000 mg | ORAL_TABLET | Freq: Three times a day (TID) | ORAL | Status: DC | PRN
  Administered 2020-03-30 – 2020-03-31 (×2): 0.1 mg via ORAL
  Filled 2020-03-30 (×2): qty 1

## 2020-03-30 MED ORDER — FLUOXETINE HCL 20 MG PO CAPS: 20.0000 mg | ORAL_CAPSULE | Freq: Every day | ORAL | Status: DC

## 2020-03-30 MED ORDER — POTASSIUM CHLORIDE CRYS ER 20 MEQ PO TBCR
20.0000 meq | EXTENDED_RELEASE_TABLET | ORAL | Status: DC
  Administered 2020-03-30: 20 meq via ORAL
  Filled 2020-03-30: qty 1

## 2020-03-30 MED ORDER — ACETAMINOPHEN 325 MG PO TABS
650.0000 mg | ORAL_TABLET | Freq: Four times a day (QID) | ORAL | Status: DC | PRN
  Administered 2020-03-30 – 2020-04-03 (×6): 650 mg via ORAL
  Filled 2020-03-30 (×6): qty 2

## 2020-03-30 MED ORDER — ALUM & MAG HYDROXIDE-SIMETH 200-200-20 MG/5ML PO SUSP
30.0000 mL | ORAL | Status: DC | PRN
  Administered 2020-03-30 (×2): 30 mL via ORAL
  Filled 2020-03-30 (×2): qty 30

## 2020-03-30 MED ORDER — DIPHENHYDRAMINE HCL 25 MG PO CAPS
25.0000 mg | ORAL_CAPSULE | Freq: Once | ORAL | Status: AC
  Administered 2020-03-30: 25 mg via ORAL
  Filled 2020-03-30: qty 1

## 2020-03-30 NOTE — Progress Notes (Signed)
eLink Physician-Brief Progress Note Patient Name: [name] DOB: 1945/06/11 MRN: [id number]   Date of Service  03/30/2020  HPI/Events of Note  Patient requests Carmex lip balm.   eICU Interventions  Plan: 1. Carmex lip balm PRN.     Intervention Category Major Interventions: Other:  Sommer,Steven Dennard Nip 03/30/2020, 2:50 AM

## 2020-03-30 NOTE — Progress Notes (Signed)
eLink Physician-Brief Progress Note Patient Name: [name] DOB: 08/03/1945 MRN: [id number]   Date of Service  03/30/2020  HPI/Events of Note  Patient request antihistamine for sinus issues and something for sleep.   eICU Interventions  Plan: 1. Benadryl 25 mg PO X 1 now (Antihystimine and sleep).     Intervention Category Major Interventions: Other:  Lenell Antu 03/30/2020, 9:22 PM

## 2020-03-30 NOTE — Progress Notes (Signed)
AM K+ 3.1 with creat 0.58 and GFR > 60. ELink CCM electrolyte protocol initiated.

## 2020-03-30 NOTE — Progress Notes (Addendum)
NAME:  [name], MRN:  [id number], DOB:  Apr 12, 1945, LOS: 2 ADMISSION DATE:  03/28/2020, CONSULTATION DATE:  03/28/20 REFERRING MD:  Allena Katz, CHIEF COMPLAINT:  Severe symptomatic hyponatremia  Brief History   75yF with severe symptomatic hyponatremia (witnessed seizure like activity in ED)  History of present illness   75 year old female with history of asthma followed by [facility] in [facility] presenting today complaining of abdominal pain and rectal pain in the setting of worsening constipation. She has IBS with diarrhea but stools have been harder to pass over the last week. She has also had progressive nausea and a couple bouts of emesis on the day of admission.   In ED she was found to have likely stool impaction and severe hyponatremia. SHe was given saline bolus, zofran, and then reglan 10 mg IV at 10pm. At 10:30pm she developed whole body shaking per son which lasted perhaps 30 seconds.   Past Medical History  Asthma HTN GERD  Significant Hospital Events   03/28/20 seizure like activity which broke after 30s spontaneously in [facility] ED 03/29/20 - received maintenance and IVF boluses with slow improvement in Na   Consults:  PCCM Neuro  Procedures:  None  Significant Diagnostic Tests:    Micro Data:  None  Antimicrobials:  None  Interim history/subjective:  Improved mental status. No seizures in last 48 hours  Objective   Blood pressure (!) 146/51, pulse 78, temperature 98.3 F (36.8 C), temperature source Axillary, resp. rate (!) 30, weight 61.4 kg, SpO2 (!) 89 %.        Intake/Output Summary (Last 24 hours) at 03/30/2020 1404 Last data filed at 03/30/2020 1102 Gross per 24 hour  Intake 2121.39 ml  Output 1102 ml  Net 1019.39 ml   Filed Weights   03/29/20 0057 03/29/20 0237  Weight: 63.5 kg 61.4 kg   Physical Exam: General: Elderly-appearing, no acute distress HENT: [place], AT, OP clear, MMM Eyes: EOMI, no scleral icterus Respiratory: Clear to  auscultation bilaterally.  No crackles, wheezing or rales Cardiovascular: RRR, -M/R/G, no JVD GI: BS+, soft, nontender Extremities:-Edema,-tenderness Neuro: AAO x4, CNII-XII grossly intact Skin: Intact, no rashes or bruising Psych: Normal mood, normal affect  Resolved Hospital Problem list   n/a  Assessment & Plan:   # Seizure-like activity # Acute encephalopathy: likely TME from hyponatremia - resolving CT head negative. No further episodes - Neuro checks - mgmt of hyponatremia as below - Medications reviewed. Previously on xanax however son states this was started two weeks ago due to patient's husband passing and rarely used  # Symptomatic severe hyponatremia: with seizure like activity x30sec in ED which broke spontaneously. History, exam, and bedside US suggestive of hypovolemia.  Slowly responsive. Corrected 6 mEQ/L over initial 12 hours. Improving mental status - S/p hypertonic boluses  - DC maintenance fluids - BMP q12h - Encourage PO intake - PRN zofran for nausea  # HTN - holding losartan-hctz in setting hyponatremia - holding clonidine for now, reassess over course of night  # asthma - brovana/pulmicort - singulair  # gerd - home ppi  #depression/anxiety - started on low dose fluoxetine   Best practice:  Diet: Advance diet as tolerated Pain/Anxiety/Delirium protocol (if indicated): not indicated VAP protocol (if indicated): not indicated DVT prophylaxis: SCDs  GI prophylaxis: home ppi Glucose control: follow glucose with lab checks, not diabetic Mobility: bed level Code Status: Full Family Communication: Updated son yesterday on 2/20 Disposition: Transfer to SDU. Transfer to [facility] for pick-up on 2/21  Labs   CBC: Recent Labs  Lab 03/28/20 1647 03/29/20 0536 03/30/20 0012  WBC 9.1 10.1 8.5  NEUTROABS 7.4  --   --   HGB 12.0 11.9* 10.6*  HCT 32.2* 31.8* 30.1*  MCV 87.3 86.4 91.2  PLT 263 243 223    Basic Metabolic Panel: Recent Labs  Lab  03/29/20 1937 03/30/20 0012 03/30/20 0510 03/30/20 0840 03/30/20 1152  NA 120* 120* 123* 128* 124*  K 3.5 3.1* 3.3* 3.5 3.3*  CL 91* 93* 93* 95* 94*  CO2 20* 18* 21* 22 23  GLUCOSE 107* 136* 117* 111* 130*  BUN 6* <5* <5* <5* <5*  CREATININE 0.49 0.58 0.58 0.60 0.49  CALCIUM 8.0* 7.9* 8.2* 8.4* 8.3*   GFR: CrCl cannot be calculated (Unknown ideal weight.). Recent Labs  Lab 03/28/20 1647 03/29/20 0536 03/30/20 0012  WBC 9.1 10.1 8.5    Liver Function Tests: Recent Labs  Lab 03/28/20 1647  AST 19  ALT 15  ALKPHOS 50  BILITOT 1.0  PROT 7.4  ALBUMIN 4.1   No results for input(s): LIPASE, AMYLASE in the last 168 hours. Recent Labs  Lab 03/28/20 2241  AMMONIA 21    ABG No results found for: PHART, PCO2ART, PO2ART, HCO3, TCO2, ACIDBASEDEF, O2SAT   Coagulation Profile: No results for input(s): INR, PROTIME in the last 168 hours.  Cardiac Enzymes: Recent Labs  Lab 03/28/20 2213  CKTOTAL 140    HbA1C: No results found for: HGBA1C  CBG: No results for input(s): GLUCAP in the last 168 hours.  Care time: 1 minutes     Mechele Collin, M.D. [facility] Pulmonary/Critical Care Medicine 03/30/2020 2:04 PM   Please see Amion for pager number to reach on-call Pulmonary and Critical Care Team.

## 2020-03-30 NOTE — Progress Notes (Signed)
eLink Physician-Brief Progress Note Patient Name: [name] DOB: July 25, 1945 MRN: [id number]   Date of Service  03/30/2020  HPI/Events of Note  Patient c/o heartburn and requests antacid.   eICU Interventions  Plan: 1. Mylanta 30 mL PO Q 4 hours PRN heartburn.      Intervention Category Major Interventions: Other:  Jenilee Franey Dennard Nip 03/30/2020, 1:16 AM

## 2020-03-30 NOTE — Progress Notes (Addendum)
eLink Physician-Brief Progress Note Patient Name: [name] DOB: 1945/06/26 MRN: [id number]   Date of Service  03/30/2020  HPI/Events of Note  Hypertension - BP = 177/87. May be d/t pain. Patient is on Catapres at home.   eICU Interventions  Plan: 1. Catapres 0.1 mg PO Q 8 hours PRN SBP > 170 or DBP > 100.  2. D/C KCL via PIV. 3. Increase next dose of Klor-can to 40 meq.     Intervention Category Major Interventions: Hypertension - evaluation and management  Millie Shorb Eugene 03/30/2020, 4:03 AM

## 2020-03-31 LAB — CBC
HCT: 34.6 % — ABNORMAL LOW (ref 36.0–46.0)
Hemoglobin: 12.7 g/dL (ref 12.0–15.0)
MCH: 32.1 pg (ref 26.0–34.0)
MCHC: 36.7 g/dL — ABNORMAL HIGH (ref 30.0–36.0)
MCV: 87.4 fL (ref 80.0–100.0)
Platelets: 259 10*3/uL (ref 150–400)
RBC: 3.96 MIL/uL (ref 3.87–5.11)
RDW: 12.7 % (ref 11.5–15.5)
WBC: 8.4 10*3/uL (ref 4.0–10.5)
nRBC: 0 % (ref 0.0–0.2)

## 2020-03-31 LAB — BASIC METABOLIC PANEL
Anion gap: 9 (ref 5–15)
Anion gap: 11 (ref 5–15)
BUN: <5 mg/dL — ABNORMAL LOW (ref 8–23)
BUN: 9 mg/dL (ref 8–23)
CO2: 23 mmol/L (ref 22–32)
CO2: 21 mmol/L — ABNORMAL LOW (ref 22–32)
Calcium: 9.1 mg/dL (ref 8.9–10.3)
Calcium: 8.8 mg/dL — ABNORMAL LOW (ref 8.9–10.3)
Chloride: 95 mmol/L — ABNORMAL LOW (ref 98–111)
Chloride: 95 mmol/L — ABNORMAL LOW (ref 98–111)
Creatinine, Ser: 0.54 mg/dL (ref 0.44–1.00)
Creatinine, Ser: 0.53 mg/dL (ref 0.44–1.00)
GFR, Estimated: >60 mL/min (ref >60)
GFR, Estimated: >60 mL/min (ref >60)
Glucose, Bld: 101 mg/dL — ABNORMAL HIGH (ref 70–99)
Glucose, Bld: 104 mg/dL — ABNORMAL HIGH (ref 70–99)
Potassium: 3.8 mmol/L (ref 3.5–5.1)
Potassium: 4.1 mmol/L (ref 3.5–5.1)
Sodium: 127 mmol/L — ABNORMAL LOW (ref 135–145)
Sodium: 127 mmol/L — ABNORMAL LOW (ref 135–145)

## 2020-03-31 MED ORDER — LATANOPROST 0.005 % OP SOLN
1.0000 [drp] | Freq: Every day | OPHTHALMIC | Status: DC
  Administered 2020-03-31 – 2020-04-02 (×3): 1 [drp] via OPHTHALMIC
  Filled 2020-03-31: qty 2.5

## 2020-03-31 MED ORDER — ENOXAPARIN SODIUM 40 MG/0.4ML ~~LOC~~ SOLN
40.0000 mg | SUBCUTANEOUS | Status: DC
  Administered 2020-03-31 – 2020-04-02 (×3): 40 mg via SUBCUTANEOUS
  Filled 2020-03-31 (×3): qty 0.4

## 2020-03-31 MED ORDER — VITAMIN B-12 1000 MCG PO TABS
1000.0000 ug | ORAL_TABLET | Freq: Every day | ORAL | Status: DC
  Administered 2020-03-31 – 2020-04-03 (×4): 1000 ug via ORAL
  Filled 2020-03-31 (×4): qty 1

## 2020-03-31 MED ORDER — TRAMADOL HCL 50 MG PO TABS: 50.0000 mg | ORAL_TABLET | Freq: Four times a day (QID) | ORAL | Status: DC | PRN

## 2020-03-31 MED ORDER — MONTELUKAST SODIUM 10 MG PO TABS
10.0000 mg | ORAL_TABLET | Freq: Every day | ORAL | Status: DC
  Administered 2020-03-31 – 2020-04-02 (×3): 10 mg via ORAL
  Filled 2020-03-31 (×3): qty 1

## 2020-03-31 MED ORDER — CLONIDINE HCL 0.2 MG PO TABS
0.3000 mg | ORAL_TABLET | Freq: Every day | ORAL | Status: DC
  Administered 2020-03-31 – 2020-04-01 (×2): 0.3 mg via ORAL
  Filled 2020-03-31 (×2): qty 1

## 2020-03-31 MED ORDER — DICYCLOMINE HCL 10 MG PO CAPS
10.0000 mg | ORAL_CAPSULE | Freq: Three times a day (TID) | ORAL | Status: DC
  Administered 2020-03-31 – 2020-04-03 (×12): 10 mg via ORAL
  Filled 2020-03-31 (×12): qty 1

## 2020-03-31 MED ORDER — ALPRAZOLAM 0.25 MG PO TABS
0.2500 mg | ORAL_TABLET | Freq: Two times a day (BID) | ORAL | Status: DC | PRN
  Administered 2020-03-31 – 2020-04-01 (×2): 0.25 mg via ORAL
  Filled 2020-03-31 (×2): qty 1

## 2020-03-31 MED ORDER — OXYCODONE HCL 5 MG PO TABS
5.0000 mg | ORAL_TABLET | Freq: Four times a day (QID) | ORAL | Status: DC | PRN
  Filled 2020-03-31: qty 1

## 2020-03-31 MED ORDER — THIAMINE HCL 100 MG PO TABS
100.0000 mg | ORAL_TABLET | Freq: Every day | ORAL | Status: DC
  Administered 2020-04-01 – 2020-04-03 (×3): 100 mg via ORAL
  Filled 2020-03-31 (×3): qty 1

## 2020-03-31 MED ORDER — VITAMIN D 25 MCG (1000 UNIT) PO TABS
1000.0000 [IU] | ORAL_TABLET | Freq: Every day | ORAL | Status: DC
  Administered 2020-03-31 – 2020-04-02 (×3): 1000 [IU] via ORAL
  Filled 2020-03-31 (×3): qty 1

## 2020-03-31 MED ORDER — LIDOCAINE 5 % EX PTCH
1.0000 | MEDICATED_PATCH | Freq: Every day | CUTANEOUS | Status: DC
  Administered 2020-03-31 – 2020-04-03 (×4): 1 via TRANSDERMAL
  Filled 2020-03-31 (×4): qty 1

## 2020-03-31 NOTE — Progress Notes (Signed)
PHARMACIST - PHYSICIAN COMMUNICATION  DR: Margo Aye  CONCERNING: IV to Oral Route Change Policy  RECOMMENDATION: This patient is receiving Thiamine by the intravenous route.  Based on criteria approved by the Pharmacy and Therapeutics Committee, the intravenous medication(s) is/are being converted to the equivalent oral dose form(s).   DESCRIPTION: These criteria include:  The patient is eating (either orally or via tube) and/or has been taking other orally administered medications for a least 24 hours  The patient has no evidence of active gastrointestinal bleeding or impaired GI absorption (gastrectomy, short bowel, patient on TNA or NPO).  If you have questions about this conversion, please contact the Pharmacy Department  []   [phone] )  ( [phone] []   [phone] )  [facility] []   [phone] )  [facility] []   [phone] )  [facility] [x]   [phone] )  [facility]   ( [phone] [place], [facility] 03/31/2020 12:07 PM

## 2020-03-31 NOTE — Progress Notes (Signed)
Patient transferred to 1526 via wheelchair. Met Harvest Dark, RN at bedside.

## 2020-03-31 NOTE — Progress Notes (Signed)
PROGRESS NOTE  [name] VOZ:[id number] DOB: Mar 22, 1945 DOA: 03/28/2020 PCP: System, Provider Not In  HPI/Recap of past 2 hours: 75 year old female with history of asthma, hypertension, GERD, anxiety, followed by [facility] in [facility] presenting today complaining of abdominal pain and rectal pain in the setting of worsening constipation. She has IBS with diarrhea but stools have been harder to pass over the last week. She has also had progressive nausea and a couple bouts of emesis on the day of admission.   In ED she was found to have likely stool impaction and severe hyponatremia. SHe was given saline bolus, zofran, and then reglan 10 mg IV at 10pm. At 10:30pm she developed whole body shaking per son which lasted perhaps 30 seconds.   03/31/20: Patient was seen and examined at her bedside in the ICU.  She reports upper back pain which is acute.  Also reports diarrhea from her IBS-D, abdominal cramping improved with defecation.  She requested to eat solid food, her diet was advanced from clears to regular consistency.   Assessment/Plan: Active Problems:   Hyponatremia  Seizure-like activity, suspected related to severe hyponatremia. Seizure precautions Treat underlying conditions  Acute toxic metabolic encephalopathy secondary to severe hyponatremia Presented with serum sodium 113, improving,127. She is currently off IV fluids. Her mentation is at baseline and she is eating well Her diet was advanced to regular consistency on 03/31/20.  Resolved hypokalemia, repleted. 3.8 from 3.1.  IBS-D/abdominal cramping/spasms Started Bentyl  Supportive care  Upper back pain discomfort Troponin peaked on 03/29/2019 at 21 and trended down. Monitor on telemetry  Chronic anxiety Resume home regimen. Was started on Prozac on 03/30/2020, will closely monitor sodium level while on Prozac.  Asthma Stable Continue home regimen  Essential hypertension Continue to hold off  losartan and HCTZ in the setting of hyponatremia. Resume home clonidine Continue to monitor vital signs.  GERD Continue home PPI.    Code Status: Full code.  Family Communication: None at bedside.  Disposition Plan: Likely home on 04/01/2020.   Consultants:  None  Procedures:  None.  Antimicrobials:  None.  DVT prophylaxis: Subcu Lovenox daily.  Status is: Inpatient    Dispo: The patient is from: Home.               Anticipated d/c is to: Home.               Anticipated d/c date is: 04/01/2020.              Patient currently not stable for DC, ongoing management of hyponatremia.   Difficult to place patient: Not applicable.        Objective: Vitals:   03/31/20 0800 03/31/20 1200 03/31/20 1345 03/31/20 1521  BP:   (!) 179/79 (!) 173/67  Pulse:    62  Resp:    16  Temp: 98.3 F (36.8 C) 98.8 F (37.1 C)  98.3 F (36.8 C)  TempSrc: Oral Oral  Oral  SpO2:    90%  Weight:        Intake/Output Summary (Last 24 hours) at 03/31/2020 1537 Last data filed at 03/31/2020 4259 Gross per 24 hour  Intake 120 ml  Output 2400 ml  Net -2280 ml   Filed Weights   03/29/20 0057 03/29/20 0237  Weight: 63.5 kg 61.4 kg    Exam:  . General: 75 y.o. year-old female well developed well nourished in no acute distress.  Alert and oriented x3. . Cardiovascular: Regular rate and rhythm  with no rubs or gallops.  No thyromegaly or JVD noted.   Marland Kitchen Respiratory: Clear to auscultation with no wheezes or rales. Good inspiratory effort. . Abdomen: Soft nontender nondistended with normal bowel sounds x4 quadrants. . Musculoskeletal: No lower extremity edema. 2/4 pulses in all 4 extremities. . Skin: No ulcerative lesions noted or rashes, . Psychiatry: Mood is anxious.   Data Reviewed: CBC: Recent Labs  Lab 03/28/20 1647 03/29/20 0536 03/30/20 0012 03/31/20 0829  WBC 9.1 10.1 8.5 8.4  NEUTROABS 7.4  --   --   --   HGB 12.0 11.9* 10.6* 12.7  HCT 32.2* 31.8* 30.1* 34.6*   MCV 87.3 86.4 91.2 87.4  PLT 263 243 223 259   Basic Metabolic Panel: Recent Labs  Lab 03/30/20 0510 03/30/20 0840 03/30/20 1152 03/30/20 1713 03/31/20 0829  NA 123* 128* 124* 123* 127*  K 3.3* 3.5 3.3* 3.7 3.8  CL 93* 95* 94* 91* 95*  CO2 21* 22 23 22 23   GLUCOSE 117* 111* 130* 117* 101*  BUN <5* <5* <5* <5* <5*  CREATININE 0.58 0.60 0.49 0.48 0.54  CALCIUM 8.2* 8.4* 8.3* 8.5* 9.1   GFR: CrCl cannot be calculated (Unknown ideal weight.). Liver Function Tests: Recent Labs  Lab 03/28/20 1647  AST 19  ALT 15  ALKPHOS 50  BILITOT 1.0  PROT 7.4  ALBUMIN 4.1   No results for input(s): LIPASE, AMYLASE in the last 168 hours. Recent Labs  Lab 03/28/20 2241  AMMONIA 21   Coagulation Profile: No results for input(s): INR, PROTIME in the last 168 hours. Cardiac Enzymes: Recent Labs  Lab 03/28/20 2213  CKTOTAL 140   BNP (last 3 results) No results for input(s): PROBNP in the last 8760 hours. HbA1C: No results for input(s): HGBA1C in the last 72 hours. CBG: No results for input(s): GLUCAP in the last 168 hours. Lipid Profile: No results for input(s): CHOL, HDL, LDLCALC, TRIG, CHOLHDL, LDLDIRECT in the last 72 hours. Thyroid Function Tests: Recent Labs    03/28/20 2241  TSH 1.709   Anemia Panel: No results for input(s): VITAMINB12, FOLATE, FERRITIN, TIBC, IRON, RETICCTPCT in the last 72 hours. Urine analysis:    Component Value Date/Time   COLORURINE STRAW (A) 03/28/2020 2241   APPEARANCEUR CLEAR 03/28/2020 2241   LABSPEC 1.010 03/28/2020 2241   PHURINE 7.0 03/28/2020 2241   GLUCOSEU 100 (A) 03/28/2020 2241   HGBUR SMALL (A) 03/28/2020 2241   BILIRUBINUR NEGATIVE 03/28/2020 2241   KETONESUR 15 (A) 03/28/2020 2241   PROTEINUR NEGATIVE 03/28/2020 2241   UROBILINOGEN 0.2 07/14/2014 0303   NITRITE NEGATIVE 03/28/2020 2241   LEUKOCYTESUR NEGATIVE 03/28/2020 2241   Sepsis Labs: @LABRCNTIP (procalcitonin:4,lacticidven:4)  ) Recent Results (from the past  240 hour(s))  SARS CORONAVIRUS 2 (TAT 6-24 HRS) Nasopharyngeal Nasopharyngeal Swab     Status: None   Collection Time: 03/28/20 11:00 PM   Specimen: Nasopharyngeal Swab  Result Value Ref Range Status   SARS Coronavirus 2 NEGATIVE NEGATIVE Final    Comment: (NOTE) SARS-CoV-2 target nucleic acids are NOT DETECTED.  The SARS-CoV-2 RNA is generally detectable in upper and lower respiratory specimens during the acute phase of infection. Negative results do not preclude SARS-CoV-2 infection, do not rule out co-infections with other pathogens, and should not be used as the sole basis for treatment or other patient management decisions. Negative results must be combined with clinical observations, patient history, and epidemiological information. The expected result is Negative.  Fact Sheet for Patients:  Fact Sheet for  Healthcare Providers: quierodirigir.com  This test is not yet approved or cleared by the [place] and  has been authorized for detection and/or diagnosis of SARS-CoV-2 by FDA under an Emergency Use Authorization (EUA). This EUA will remain  in effect (meaning this test can be used) for the duration of the COVID-19 declaration under Se ction 564(b)(1) of the Act, 21 U.S.C. section 360bbb-3(b)(1), unless the authorization is terminated or revoked sooner.  Performed at [facility], [address], [place], [place] [address]   MRSA PCR Screening     Status: None   Collection Time: 03/29/20  2:39 AM   Specimen: Nasopharyngeal  Result Value Ref Range Status   MRSA by PCR NEGATIVE NEGATIVE Final    Comment:        The GeneXpert MRSA Assay (FDA approved for NASAL specimens only), is one component of a comprehensive MRSA colonization surveillance program. It is not intended to diagnose MRSA infection nor to guide or monitor treatment for MRSA infections. Performed at [facility], [address], [place], [place] [address]       Studies: No results found.  Scheduled Meds: . arformoterol  15 mcg Nebulization BID  . budesonide (PULMICORT) nebulizer solution  0.25 mg Nebulization BID  . Chlorhexidine Gluconate Cloth  6 each Topical Daily  . dicyclomine  10 mg Oral TID AC & HS  . FLUoxetine  10 mg Oral Daily  . latanoprost  1 drop Both Eyes QHS  . lidocaine  1 patch Transdermal Daily  . montelukast  10 mg Oral QHS  . pantoprazole  40 mg Oral Daily  . [START ON 04/01/2020] thiamine  100 mg Oral Daily    Continuous Infusions: . ondansetron (ZOFRAN) IV Stopped (03/29/20 1810)     LOS: 3 days     Darlin Drop, MD [facility] Pager [phone]  If 7PM-7AM, please contact night-coverage [email or web address] Password [facility] 03/31/2020, 3:37 PM

## 2020-03-31 NOTE — TOC Initial Note (Signed)
Transition of Care [facility]) - Initial/Assessment Note    Patient Details  Name: [name] MRN: [id number] Date of Birth: 1945-08-04  Transition of [facility]) CM/SW Contact:    Golda Acre, RN Phone Number: 03/31/2020, 7:58 AM  Clinical Narrative:                 75 year old female with history of asthma followed by [facility] in [facility] presenting today complaining of abdominal pain and rectal pain in the setting of worsening constipation. She has IBS with diarrhea but stools have been harder to pass over the last week. She has also had progressive nausea and a couple bouts of emesis on the day of admission.   In ED she was found to have likely stool impaction and severe hyponatremia. SHe was given saline bolus, zofran, and then reglan 10 mg IV at 10pm. At 10:30pm she developed whole body shaking per son which lasted perhaps 30 seconds.   Past Medical History  Asthma HTN GERD  Significant Hospital Events   03/28/20 seizure like activity which broke after 30s spontaneously in [facility] ED 03/29/20 - received maintenance and IVF boluses with slow improvement in [id number] Plan to return to home in [place] with spouse  Expected Discharge Plan: Home/Self Care Barriers to Discharge: Continued Medical Work up   Patient Goals and CMS Choice Patient states their goals for this hospitalization and ongoing recovery are:: to go home to [place]      Expected Discharge Plan and Services Expected Discharge Plan: Home/Self Care   Discharge Planning Services: CM Consult   Living arrangements for the past 2 months: Single Family Home                                      Prior Living Arrangements/Services Living arrangements for the past 2 months: Single Family Home Lives with:: Spouse Patient language and need for interpreter reviewed:: Yes Do you feel safe going back to the place where you live?: Yes      Need for Family Participation in Patient Care:  Yes (Comment) Care giver support system in place?: Yes (comment)   Criminal Activity/Legal Involvement Pertinent to Current Situation/Hospitalization: No - Comment as needed  Activities of Daily Living      Permission Sought/Granted                  Emotional Assessment Appearance:: Appears stated age Attitude/Demeanor/Rapport: Engaged Affect (typically observed): Calm Orientation: : Oriented to Place,Oriented to Self,Oriented to  Time,Oriented to Situation Alcohol / Substance Use: Not Applicable Psych Involvement: No (comment)  Admission diagnosis:  Acute hyponatremia [E87.1] Fecal impaction (HCC) [K56.41] Hyponatremia [E87.1] Seizure (HCC) [R56.9] Non-intractable vomiting with nausea, unspecified vomiting type [R11.2] Patient Active Problem List   Diagnosis Date Noted  . Hyponatremia 03/28/2020   PCP:  System, Provider Not In Pharmacy:   [id number] - [place], [place] - [address] [place] [address] Phone: [phone] Fax: [phone]     Social Determinants of Health (SDOH) Interventions    Readmission Risk Interventions No flowsheet data found.

## 2020-04-01 ENCOUNTER — Inpatient Hospital Stay (HOSPITAL_COMMUNITY): Payer: Medicare HMO

## 2020-04-01 ENCOUNTER — Encounter (HOSPITAL_COMMUNITY): Payer: Self-pay | Admitting: Student

## 2020-04-01 DIAGNOSIS — E440 Moderate protein-calorie malnutrition: Secondary | ICD-10-CM | POA: Insufficient documentation

## 2020-04-01 LAB — BASIC METABOLIC PANEL
Anion gap: 9 (ref 5–15)
Anion gap: 7 (ref 5–15)
BUN: 12 mg/dL (ref 8–23)
BUN: 12 mg/dL (ref 8–23)
CO2: 25 mmol/L (ref 22–32)
CO2: 27 mmol/L (ref 22–32)
Calcium: 9.4 mg/dL (ref 8.9–10.3)
Calcium: 9.3 mg/dL (ref 8.9–10.3)
Chloride: 96 mmol/L — ABNORMAL LOW (ref 98–111)
Chloride: 95 mmol/L — ABNORMAL LOW (ref 98–111)
Creatinine, Ser: 0.75 mg/dL (ref 0.44–1.00)
Creatinine, Ser: 0.62 mg/dL (ref 0.44–1.00)
GFR, Estimated: >60 mL/min (ref >60)
GFR, Estimated: >60 mL/min (ref >60)
Glucose, Bld: 98 mg/dL (ref 70–99)
Glucose, Bld: 133 mg/dL — ABNORMAL HIGH (ref 70–99)
Potassium: 3.9 mmol/L (ref 3.5–5.1)
Potassium: 3.7 mmol/L (ref 3.5–5.1)
Sodium: 130 mmol/L — ABNORMAL LOW (ref 135–145)
Sodium: 129 mmol/L — ABNORMAL LOW (ref 135–145)

## 2020-04-01 LAB — PHOSPHORUS: Phosphorus: 1.9 mg/dL — ABNORMAL LOW (ref 2.5–4.6)

## 2020-04-01 LAB — TROPONIN I (HIGH SENSITIVITY): Troponin I (High Sensitivity): 9 ng/L (ref <18)

## 2020-04-01 LAB — D-DIMER, QUANTITATIVE: D-Dimer, Quant: 3.15 ug/mL-FEU — ABNORMAL HIGH (ref 0.00–0.50)

## 2020-04-01 LAB — MAGNESIUM: Magnesium: 2.3 mg/dL (ref 1.7–2.4)

## 2020-04-01 MED ORDER — LOPERAMIDE HCL 2 MG PO CAPS
2.0000 mg | ORAL_CAPSULE | ORAL | Status: DC | PRN
  Administered 2020-04-01: 2 mg via ORAL
  Filled 2020-04-01 (×2): qty 1

## 2020-04-01 MED ORDER — SENNA 8.6 MG PO TABS
1.0000 | ORAL_TABLET | Freq: Every day | ORAL | Status: DC
  Administered 2020-04-02: 8.6 mg via ORAL
  Filled 2020-04-01: qty 1

## 2020-04-01 MED ORDER — MORPHINE SULFATE (PF) 2 MG/ML IV SOLN: 2.0000 mg | INTRAVENOUS | Status: DC | PRN

## 2020-04-01 MED ORDER — ALPRAZOLAM 0.25 MG PO TABS
0.2500 mg | ORAL_TABLET | Freq: Three times a day (TID) | ORAL | Status: DC | PRN
  Administered 2020-04-01 – 2020-04-02 (×3): 0.25 mg via ORAL
  Filled 2020-04-01 (×3): qty 1

## 2020-04-01 MED ORDER — SODIUM CHLORIDE 0.9 % IV SOLN: INTRAVENOUS | Status: DC

## 2020-04-01 MED ORDER — CLONIDINE HCL 0.1 MG PO TABS
0.1000 mg | ORAL_TABLET | Freq: Every day | ORAL | Status: DC
Start: 2020-04-02 — End: 2020-04-02
  Administered 2020-04-02: 0.1 mg via ORAL
  Filled 2020-04-01: qty 1

## 2020-04-01 MED ORDER — TRAMADOL-ACETAMINOPHEN 37.5-325 MG PO TABS
1.0000 | ORAL_TABLET | Freq: Four times a day (QID) | ORAL | Status: DC | PRN
  Administered 2020-04-01 – 2020-04-02 (×3): 1 via ORAL
  Filled 2020-04-01 (×3): qty 1

## 2020-04-01 NOTE — Progress Notes (Signed)
PROGRESS NOTE  [name] WGN:[id number] DOB: 1946-01-29 DOA: 03/28/2020 PCP: System, Provider Not In  HPI/Recap of past 71 hours: 75 year old female with history of asthma, essential hypertension, GERD, anxiety, followed by [facility] in [facility] presenting today complaining of abdominal pain and rectal pain in the setting of worsening constipation. She has IBS with diarrhea but stools have been harder to pass over the last week. She has also had progressive nausea and a couple bouts of emesis on the day of admission.   In ED she was found to have likely stool impaction and severe hyponatremia. SHe was given saline bolus, zofran, and then reglan 10 mg IV at 10pm. At 10:30pm she developed whole body shaking per son which lasted perhaps 30 seconds.   Hospital course complicated by report of chest pain and abdominal pain. Also reports diarrhea from IBS- D, abdominal cramping improving with defecation for which she was started on Bentyl.   04/01/20: Seen and examined at her bedside, she reports pleuritic pain worse when she takes a deep breath. D-dimer elevated greater than 3, O2 sat 100% on RA, no tachycardia, no LE edema, no sedentary lifestyle.  Will obtain 12 lead EKG, closely monitor and repeat D-dimer in the AM.   Assessment/Plan: Active Problems:   Hyponatremia  Seizure-like activity, suspected related to severe hyponatremia. Seizure precautions in place Treat underlying conditions  Severe hypovolemic hyponatremia She presented with serum sodium of 113, was admitted to the ICU she was given saline IV fluid. She is currently off IV fluid Serum sodium 130.  Pleuritic pain, unclear etiology O2 saturation of 100% on room air, not tachycardic Monitor for now with pain control  D-dimer, suspect inflammatory response from IBS-D No lower extremity edema No history of sedentary lifestyle D-dimer elevated 3.15 Repeat D-dimer in the morning  Resolved acute toxic  metabolic encephalopathy secondary to severe hyponatremia Presented with serum sodium 113, improving,127. She is currently off IV fluids. Her mentation is at baseline and she is eating well Her diet was advanced to regular consistency on 03/31/20.  Resolved hypokalemia, repleted. 3.8 from 3.1.  IBS-D/abdominal cramping/spasms Continue Bentyl  Imodium as needed for diarrhea  Upper back pain discomfort Troponin peaked on 03/29/2019 at 21 and trended down. Repeat troponin on 04/01/2020, unremarkable at 9.  Chronic anxiety Continue home regimen. Was started on Prozac on 03/30/2020 in the ICU Continue to closely monitor sodium level while on Prozac.  Asthma Stable Continue home regimen  Essential hypertension Continue to hold off losartan and HCTZ in the setting of hyponatremia. Resume home clonidine Continue to monitor vital signs.  GERD Continue home PPI.    Code Status: Full code.  Family Communication: None at bedside.  Disposition Plan: Likely home on 04/02/2020.   Consultants:  None  Procedures:  None.  Antimicrobials:  None.  DVT prophylaxis: Subcu Lovenox daily.  Status is: Inpatient    Dispo: The patient is from: Home.               Anticipated d/c is to: Home.               Anticipated d/c date is: 04/02/2020.              Patient currently not stable for DC, ongoing management of pleuritic pain.   Difficult to place patient: Not applicable.        Objective: Vitals:   04/01/20 0500 04/01/20 0856 04/01/20 1040 04/01/20 1407  BP:   128/68 106/61  Pulse:    Marland Kitchen)  58  Resp:      Temp:      TempSrc:      SpO2:  100%  100%  Weight: 59.8 kg       Intake/Output Summary (Last 24 hours) at 04/01/2020 1541 Last data filed at 04/01/2020 1326 Gross per 24 hour  Intake 1181 ml  Output -  Net 1181 ml   Filed Weights   03/29/20 0057 03/29/20 0237 04/01/20 0500  Weight: 63.5 kg 61.4 kg 59.8 kg    Exam:  . General: 75 y.o. year-old female  well developed well nourished in no acute distress. Alert and oriented x3. . Cardiovascular: Regular rate and rhythm no rubs or gallops.  Marland Kitchen Respiratory: Clear to auscultation no wheezes no rales.  . Abdomen: Soft nontender normal bowel sounds present.  . Musculoskeletal: No lower extremity edema bilaterally. . Skin: No ulcerative lesions noted. Marland Kitchen Psychiatry: Mood is anxious.   Data Reviewed: CBC: Recent Labs  Lab 03/28/20 1647 03/29/20 0536 03/30/20 0012 03/31/20 0829  WBC 9.1 10.1 8.5 8.4  NEUTROABS 7.4  --   --   --   HGB 12.0 11.9* 10.6* 12.7  HCT 32.2* 31.8* 30.1* 34.6*  MCV 87.3 86.4 91.2 87.4  PLT 263 243 223 259   Basic Metabolic Panel: Recent Labs  Lab 03/30/20 1152 03/30/20 1713 03/31/20 0829 03/31/20 1734 04/01/20 0800  NA 124* 123* 127* 127* 130*  K 3.3* 3.7 3.8 4.1 3.9  CL 94* 91* 95* 95* 96*  CO2 23 22 23  21* 25  GLUCOSE 130* 117* 101* 104* 98  BUN <5* <5* <5* 9 12  CREATININE 0.49 0.48 0.54 0.53 0.75  CALCIUM 8.3* 8.5* 9.1 8.8* 9.4  MG  --   --   --  2.3  --   PHOS  --   --   --  1.9*  --    GFR: CrCl cannot be calculated (Unknown ideal weight.). Liver Function Tests: Recent Labs  Lab 03/28/20 1647  AST 19  ALT 15  ALKPHOS 50  BILITOT 1.0  PROT 7.4  ALBUMIN 4.1   No results for input(s): LIPASE, AMYLASE in the last 168 hours. Recent Labs  Lab 03/28/20 2241  AMMONIA 21   Coagulation Profile: No results for input(s): INR, PROTIME in the last 168 hours. Cardiac Enzymes: Recent Labs  Lab 03/28/20 2213  CKTOTAL 140   BNP (last 3 results) No results for input(s): PROBNP in the last 8760 hours. HbA1C: No results for input(s): HGBA1C in the last 72 hours. CBG: No results for input(s): GLUCAP in the last 168 hours. Lipid Profile: No results for input(s): CHOL, HDL, LDLCALC, TRIG, CHOLHDL, LDLDIRECT in the last 72 hours. Thyroid Function Tests: No results for input(s): TSH, T4TOTAL, FREET4, T3FREE, THYROIDAB in the last 72  hours. Anemia Panel: No results for input(s): VITAMINB12, FOLATE, FERRITIN, TIBC, IRON, RETICCTPCT in the last 72 hours. Urine analysis:    Component Value Date/Time   COLORURINE STRAW (A) 03/28/2020 2241   APPEARANCEUR CLEAR 03/28/2020 2241   LABSPEC 1.010 03/28/2020 2241   PHURINE 7.0 03/28/2020 2241   GLUCOSEU 100 (A) 03/28/2020 2241   HGBUR SMALL (A) 03/28/2020 2241   BILIRUBINUR NEGATIVE 03/28/2020 2241   KETONESUR 15 (A) 03/28/2020 2241   PROTEINUR NEGATIVE 03/28/2020 2241   UROBILINOGEN 0.2 07/14/2014 0303   NITRITE NEGATIVE 03/28/2020 2241   LEUKOCYTESUR NEGATIVE 03/28/2020 2241   Sepsis Labs: @LABRCNTIP (procalcitonin:4,lacticidven:4)  ) Recent Results (from the past 240 hour(s))  SARS CORONAVIRUS 2 (TAT 6-24 HRS)  Nasopharyngeal Nasopharyngeal Swab     Status: None   Collection Time: 03/28/20 11:00 PM   Specimen: Nasopharyngeal Swab  Result Value Ref Range Status   SARS Coronavirus 2 NEGATIVE NEGATIVE Final    Comment: (NOTE) SARS-CoV-2 target nucleic acids are NOT DETECTED.  The SARS-CoV-2 RNA is generally detectable in upper and lower respiratory specimens during the acute phase of infection. Negative results do not preclude SARS-CoV-2 infection, do not rule out co-infections with other pathogens, and should not be used as the sole basis for treatment or other patient management decisions. Negative results must be combined with clinical observations, patient history, and epidemiological information. The expected result is Negative.  Fact Sheet for Patients: HairSlick.no  Fact Sheet for Healthcare Providers: quierodirigir.com  This test is not yet approved or cleared by the [place] FDA and  has been authorized for detection and/or diagnosis of SARS-CoV-2 by FDA under an Emergency Use Authorization (EUA). This EUA will remain  in effect (meaning this test can be used) for the duration of the COVID-19  declaration under Se ction 564(b)(1) of the Act, 21 U.S.C. section 360bbb-3(b)(1), unless the authorization is terminated or revoked sooner.  Performed at [facility], [address], [place], [place] [address]   MRSA PCR Screening     Status: None   Collection Time: 03/29/20  2:39 AM   Specimen: Nasopharyngeal  Result Value Ref Range Status   MRSA by PCR NEGATIVE NEGATIVE Final    Comment:        The GeneXpert MRSA Assay (FDA approved for NASAL specimens only), is one component of a comprehensive MRSA colonization surveillance program. It is not intended to diagnose MRSA infection nor to guide or monitor treatment for MRSA infections. Performed at [facility], [address], [place], [place] [address]       Studies: DG CHEST PORT 1 VIEW  Result Date: 04/01/2020 CLINICAL DATA:  Pleuritic pain. EXAM: PORTABLE CHEST 1 VIEW COMPARISON:  03/28/2020. FINDINGS: Mediastinum hilar structures normal. Heart size stable. No pulmonary venous congestion. Mild left base atelectasis/infiltrate and tiny left pleural effusion. No pneumothorax. IMPRESSION: Mild left base atelectasis/infiltrate and tiny left pleural effusion. Electronically Signed   By: Maisie Fus  Register   On: 04/01/2020 08:56    Scheduled Meds: . arformoterol  15 mcg Nebulization BID  . budesonide (PULMICORT) nebulizer solution  0.25 mg Nebulization BID  . Chlorhexidine Gluconate Cloth  6 each Topical Daily  . cholecalciferol  1,000 Units Oral Daily  . cloNIDine  0.3 mg Oral Daily  . dicyclomine  10 mg Oral TID AC & HS  . enoxaparin (LOVENOX) injection  40 mg Subcutaneous Q24H  . FLUoxetine  10 mg Oral Daily  . latanoprost  1 drop Both Eyes QHS  . lidocaine  1 patch Transdermal Daily  . montelukast  10 mg Oral QHS  . pantoprazole  40 mg Oral Daily  . senna  1 tablet Oral QHS  . thiamine  100 mg Oral Daily  . vitamin B-12  1,000 mcg Oral Daily    Continuous Infusions: . sodium chloride 50  mL/hr at 04/01/20 1258  . ondansetron (ZOFRAN) IV Stopped (03/29/20 1810)     LOS: 4 days     Darlin Drop, MD [facility] Pager [phone]  If 7PM-7AM, please contact night-coverage [email or web address] Password [facility] 04/01/2020, 3:41 PM

## 2020-04-01 NOTE — Care Management Important Message (Signed)
Important Message  Patient Details IM Letter given to the Patient. Name: [name] MRN: [id number] Date of Birth: Aug 22, 1945   Medicare Important Message Given:  Yes     Caren Macadam 04/01/2020, 10:29 AM

## 2020-04-01 NOTE — Progress Notes (Signed)
Initial Nutrition Assessment  DOCUMENTATION CODES:   Non-severe (moderate) malnutrition in context of chronic illness  INTERVENTION:   -Provided and discussed diet with patient at length -Provided "IBS Nutrition therapy" handouts with multiple examples menus.  -Encouraged PO intakes  NUTRITION DIAGNOSIS:   Increased nutrient needs related to acute illness as evidenced by estimated needs.  GOAL:   Patient will meet greater than or equal to 90% of their needs  MONITOR:   PO intake,Labs,Weight trends,I & O's  REASON FOR ASSESSMENT:   Consult Assessment of nutrition requirement/status,Diet education  ASSESSMENT:   75 year old female with history of asthma, hypertension, GERD, anxiety, followed by [facility] in [facility] presenting today complaining of abdominal pain and rectal pain in the setting of worsening constipation. She has IBS with diarrhea but stools have been harder to pass over the last week. She has also had progressive nausea and a couple bouts of emesis on the day of admission.  Patient with many nutrition related questions. Pt reports she has had IBS-D for the majority of her life and had been managing it well until recently her husband passed ~3 weeks ago. Since then she has not been as active and not "eating right". Pt states she knows what fibrous foods are but reports she was eating them PTA. Had a bout of constipation in which she took laxatives which caused increased diarrhea.  Reviewed low and high fiber foods, identifying symptom triggers and finding ways to manage her stress. Pt states in times of stress she had increases in IBS symptoms.   Provided active listening at times. Provided handouts and example menus per pt request. Is not interested in protein supplements, would rather make her own smoothies at home. Denies poor appetite.  Reports weight loss and changes in how her clothes have been fitting over the past year. Per weight records, pt has  lost 18 lbs since 05/29/19 (12% wt loss x 10 months, insignificant for time frame).  Medications: Vitamin D, Senokot, Thiamine, Vitamin B-12  Labs reviewed: Low Na, Phos Mg WNL  NUTRITION - FOCUSED PHYSICAL EXAM:  Flowsheet Row Most Recent Value  Orbital Region Mild depletion  Upper Arm Region Moderate depletion  Thoracic and Lumbar Region Unable to assess  Buccal Region Mild depletion  Temple Region Mild depletion  Clavicle Bone Region Mild depletion  Clavicle and Acromion Bone Region Mild depletion  Scapular Bone Region Unable to assess  Dorsal Hand Mild depletion  Patellar Region Unable to assess  Anterior Thigh Region Unable to assess  Posterior Calf Region Unable to assess  Edema (RD Assessment) None  Mouth Reviewed       Diet Order:   Diet Order            Diet regular Room service appropriate? Yes; Fluid consistency: Thin  Diet effective now                 EDUCATION NEEDS:   Education needs have been addressed  Skin:  Skin Assessment: Reviewed RN Assessment  Last BM:  2/22-type 6  Height:   Ht Readings from Last 1 Encounters:  No data found for Ht  5'2" per  Care everywhere  Weight:   Wt Readings from Last 1 Encounters:  04/01/20 59.8 kg   BMI:  24 kg/m^2  Estimated Nutritional Needs:   Kcal:  1500-1700  Protein:  75-90g  Fluid:  1.7L/day   Tilda Franco, MS, RD, LDN Inpatient Clinical Dietitian Contact information available via Amion

## 2020-04-02 ENCOUNTER — Inpatient Hospital Stay (HOSPITAL_COMMUNITY): Payer: Medicare HMO

## 2020-04-02 LAB — BASIC METABOLIC PANEL
Anion gap: 9 (ref 5–15)
BUN: 14 mg/dL (ref 8–23)
CO2: 22 mmol/L (ref 22–32)
Calcium: 8.8 mg/dL — ABNORMAL LOW (ref 8.9–10.3)
Chloride: 97 mmol/L — ABNORMAL LOW (ref 98–111)
Creatinine, Ser: 0.62 mg/dL (ref 0.44–1.00)
GFR, Estimated: >60 mL/min (ref >60)
Glucose, Bld: 101 mg/dL — ABNORMAL HIGH (ref 70–99)
Potassium: 4.3 mmol/L (ref 3.5–5.1)
Sodium: 128 mmol/L — ABNORMAL LOW (ref 135–145)

## 2020-04-02 LAB — D-DIMER, QUANTITATIVE: D-Dimer, Quant: 2.5 ug/mL-FEU — ABNORMAL HIGH (ref 0.00–0.50)

## 2020-04-02 MED ORDER — LIP MEDEX EX OINT
TOPICAL_OINTMENT | CUTANEOUS | Status: AC
  Filled 2020-04-02: qty 7

## 2020-04-02 MED ORDER — IOHEXOL 350 MG/ML SOLN
100.0000 mL | Freq: Once | INTRAVENOUS | Status: AC | PRN
  Administered 2020-04-02: 56 mL via INTRAVENOUS

## 2020-04-02 MED ORDER — CLONIDINE HCL 0.1 MG PO TABS
0.3000 mg | ORAL_TABLET | Freq: Every day | ORAL | Status: DC
  Administered 2020-04-03: 0.3 mg via ORAL
  Filled 2020-04-02: qty 1

## 2020-04-02 MED ORDER — CLONIDINE HCL 0.2 MG PO TABS: 0.2000 mg | ORAL_TABLET | Freq: Every day | ORAL | Status: DC

## 2020-04-02 MED ORDER — CLONIDINE HCL 0.2 MG PO TABS
0.3000 mg | ORAL_TABLET | Freq: Once | ORAL | Status: AC
  Administered 2020-04-02: 0.3 mg via ORAL
  Filled 2020-04-02: qty 1

## 2020-04-02 NOTE — Plan of Care (Signed)

## 2020-04-02 NOTE — Progress Notes (Signed)
Chaplain providing support in response to spiritual care consult, RN referral.   Providing grief support and resources around death of Donna. Rindfleisch' spouse.  Chaplain provided space for eulogizing, normalizing of emotional response, connection to values and spiritual practice around loss.   [name] expressed "I thought I was handling it, but I'm not handling it"  Spoke with chaplain about grief response, value of safe space to express emotion and reorienting to "a new world"    Chaplain will provide grief resources and spiritual materials for support and follow up for continued grief support.

## 2020-04-02 NOTE — Progress Notes (Signed)
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--found to have severe impaction on admission Recent severe grief reaction bereavement loss of husband Admitted 03/1818 22 by critical care secondary to abrupt seizure in the setting of severe hyponatremia sodium 113 on admission  Assessment & Plan:   Elevated D-dimer, lung nodule Pulmonary embolism ruled out no further work-up inpatient Outpatient needs low-dose CT scan for 5 mm nodule in 12 months Severe hyponatremia on admission 2/2 diuretics (HCTZ) and potomania Much improved, discontinue fluid replacement 2/23 Seizure from severe hyponatremia on admission One-time occurrence did not recur Discontinue tramadol which has a risk of seizure HTN Increase clonidine to 0.2 mg daily 2/23 IBS-previous IBS-D Admitted with severe constipation Keep on bowel regimen MiraLAX 17 g senna 8.6 If no stool in several days may need to augment however would hold Lomotil 2 mg as needed Continuing Bentyl 10 3 times daily Resolving hypokalemia Resolved no further replacement required Asthma Continue Brovana 15 mcg twice daily, Pulmicort 0.25 twice daily and Singulair 10 daily Do not appreciate current wheeze Reflux Continue pantoprazole 40 daily Bipolar 2, prolonged grief reaction secondary to loss of husband Continue Xanax 0.25 3 times daily as needed anxiety Continue Prozac 10 daily started this admission Patient willing to use chaplain services to help with blood transition   DVT prophylaxis: Lovenox Code Status: Full Family Communication: Discussed with the sister at the bedside in person Disposition:  Status is: Inpatient  Remains inpatient appropriate because:Hemodynamically unstable, Altered mental status, Ongoing diagnostic testing needed not appropriate for  outpatient work up and Unsafe d/c plan   Dispo: The patient is from: Home              Anticipated d/c is to: Home likely with home health              Anticipated d/c date is: 2 days              Patient currently is not medically stable to d/c.   Difficult to place patient No       Consultants:   None  Procedures: No  Antimicrobials: No   Subjective: Awake coherent no distress eating drinking Nutritionist came by and helped explain to her what foods to eat with her IBS and foods that have enough salt She has no further chest pain We discussed her CT scan this morning   Objective: Vitals:   04/01/20 1942 04/01/20 2030 04/02/20 0359 04/02/20 1426  BP:  109/62 (!) 147/66 (!) 176/71  Pulse:  65 72 66  Resp:    (!) 23  Temp:  97.8 F (36.6 C) 98.3 F (36.8 C) 98.2 F (36.8 C)  TempSrc:  Axillary Oral   SpO2: 99% 99% 98% 100%  Weight:        Intake/Output Summary (Last 24 hours) at 04/02/2020 1427 Last data filed at 04/02/2020 0400 Gross per 24 hour  Intake 766.1 ml  Output 1 ml  Net 765.1 ml   Filed Weights   03/29/20 0057 03/29/20 0237 04/01/20 0500  Weight: 63.5 kg 61.4 kg 59.8 kg    Examination:  Awake coherent no distress EOMI looks younger than stated age Chest clear no added sound S1-S2 no murmur no rub no gallop Abdomen soft no rebound no guarding No lower extremity edema Neurologically intact no focal deficit Psych euthymic  Data  Reviewed: personally reviewed   CBC    Component Value Date/Time   WBC 8.4 03/31/2020 0829   RBC 3.96 03/31/2020 0829   HGB 12.7 03/31/2020 0829   HCT 34.6 (L) 03/31/2020 0829   PLT 259 03/31/2020 0829   MCV 87.4 03/31/2020 0829   MCH 32.1 03/31/2020 0829   MCHC 36.7 (H) 03/31/2020 0829   RDW 12.7 03/31/2020 0829   LYMPHSABS 1.1 03/28/2020 1647   MONOABS 0.6 03/28/2020 1647   EOSABS 0.0 03/28/2020 1647   BASOSABS 0.0 03/28/2020 1647   CMP Latest Ref Rng & Units 04/02/2020 04/01/2020 04/01/2020  Glucose 70  - 99 mg/dL 371(G) 626(R) 98  BUN 8 - 23 mg/dL 14 12 12   Creatinine 0.44 - 1.00 mg/dL 4.85 4.62  Sodium 135 - 145 mmol/L 128(L) 129(L) 130(L)  Potassium 3.5 - 5.1 mmol/L 4.3 3.7 3.9  Chloride 98 - 111 mmol/L 97(L) 95(L) 96(L)  CO2 22 - 32 mmol/L 22 27 25   Calcium 8.9 - 10.3 mg/dL 7.03) 9.3 9.4  Total Protein 6.5 - 8.1 g/dL - - -  Total Bilirubin 0.3 - 1.2 mg/dL - - -  Alkaline Phos 38 - 126 U/L - - -  AST 15 - 41 U/L - - -  ALT 0 - 44 U/L - - -     Radiology Studies: CT ANGIO CHEST PE W OR WO CONTRAST  Result Date: 04/02/2020 CLINICAL DATA:  Chest pain EXAM: CT ANGIOGRAPHY CHEST WITH CONTRAST TECHNIQUE: Multidetector CT imaging of the chest was performed using the standard protocol during bolus administration of intravenous contrast. Multiplanar CT image reconstructions and MIPs were obtained to evaluate the vascular anatomy. CONTRAST:  48mL OMNIPAQUE IOHEXOL 350 MG/ML SOLN COMPARISON:  Chest radiograph April 01, 2020 FINDINGS: Cardiovascular: There is no demonstrable pulmonary embolus. There is no thoracic aortic aneurysm or dissection. The visualized great vessels appear normal. There is mild aortic atherosclerosis. There is no appreciable pericardial effusion or pericardial thickening. Mediastinum/Nodes: Visualized thyroid appears unremarkable. There is no evident thoracic adenopathy. No esophageal lesions are appreciable. Lungs/Pleura: There is atelectatic change with mild associated consolidation in the posterior left base. There is atelectatic change in the posterior right base. There is a small focus of consolidation in the inferior most aspect of the lingula. There is scarring in the apices, more on the right than on the left. There is a focal nodular opacity in the anterior segment of the right upper lobe measuring 5 mm. No similar nodular opacities evident elsewhere. No appreciable pleural effusions. No evident pneumothorax. Trachea and major bronchial structures appear patent.  Upper Abdomen: There is a cyst in the periphery of the right lobe of the liver laterally measuring 2.2 x 1.6 cm. There is incomplete visualization of a cyst in the medial mid left kidney measuring 1.7 x 1.2 cm. Visualized upper abdominal structures appear unremarkable. Musculoskeletal: No blastic or lytic bone lesions are evident. Focus of calcification in the posterior longitudinal ligament noted in midthoracic region causing mild impression on the canal in this area. No chest wall lesions. Review of the MIP images confirms the above findings. IMPRESSION: 1. No evident pulmonary embolus. No thoracic aortic aneurysm or dissection. There is mild aortic atherosclerosis. 2. Atelectatic change in the lung bases. Suspect small focus of pneumonia in the inferior lingula and in theposterior segment of the left lower lobe. 3. 5 mm nodular opacity in the anterior segment of the right upper lobe. No follow-up needed if patient is low-risk. Non-contrast chest CT can be  considered in 12 months if patient is high-risk. This recommendation follows the consensus statement: Guidelines for Management of Incidental Pulmonary Nodules Detected on CT Images: From the Fleischner Society 2017; Radiology 2017; 284:228-243. 4.  No evident adenopathy. Aortic Atherosclerosis (ICD10-I70.0). Electronically Signed   By: Bretta Bang III M.D.   On: 04/02/2020 09:46   DG CHEST PORT 1 VIEW  Result Date: 04/01/2020 CLINICAL DATA:  Pleuritic pain. EXAM: PORTABLE CHEST 1 VIEW COMPARISON:  03/28/2020. FINDINGS: Mediastinum hilar structures normal. Heart size stable. No pulmonary venous congestion. Mild left base atelectasis/infiltrate and tiny left pleural effusion. No pneumothorax. IMPRESSION: Mild left base atelectasis/infiltrate and tiny left pleural effusion. Electronically Signed   By: Maisie Fus  Register   On: 04/01/2020 08:56     Scheduled Meds: . arformoterol  15 mcg Nebulization BID  . budesonide (PULMICORT) nebulizer solution   0.25 mg Nebulization BID  . Chlorhexidine Gluconate Cloth  6 each Topical Daily  . [START ON 04/03/2020] cloNIDine  0.3 mg Oral Daily  . cloNIDine  0.3 mg Oral Once  . dicyclomine  10 mg Oral TID AC & HS  . enoxaparin (LOVENOX) injection  40 mg Subcutaneous Q24H  . FLUoxetine  10 mg Oral Daily  . latanoprost  1 drop Both Eyes QHS  . lidocaine  1 patch Transdermal Daily  . montelukast  10 mg Oral QHS  . pantoprazole  40 mg Oral Daily  . senna  1 tablet Oral QHS  . thiamine  100 mg Oral Daily  . vitamin B-12  1,000 mcg Oral Daily   Continuous Infusions: . sodium chloride 50 mL/hr at 04/01/20 1258  . ondansetron (ZOFRAN) IV Stopped (03/29/20 1810)     LOS: 5 days   Time spent: 47  Rhetta Mura, MD [facility] To contact the attending provider between 7A-7P or the covering provider during after hours 7P-7A, please log into the web site [email or web address] and access using universal [place] password for that web site. If you do not have the password, please call the hospital operator.  04/02/2020, 2:27 PM

## 2020-04-03 LAB — COMPREHENSIVE METABOLIC PANEL
ALT: 32 U/L (ref 0–44)
AST: 31 U/L (ref 15–41)
Albumin: 3 g/dL — ABNORMAL LOW (ref 3.5–5.0)
Alkaline Phosphatase: 38 U/L (ref 38–126)
Anion gap: 8 (ref 5–15)
BUN: 17 mg/dL (ref 8–23)
CO2: 24 mmol/L (ref 22–32)
Calcium: 9.2 mg/dL (ref 8.9–10.3)
Chloride: 100 mmol/L (ref 98–111)
Creatinine, Ser: 0.69 mg/dL (ref 0.44–1.00)
GFR, Estimated: >60 mL/min (ref >60)
Glucose, Bld: 107 mg/dL — ABNORMAL HIGH (ref 70–99)
Potassium: 4.3 mmol/L (ref 3.5–5.1)
Sodium: 132 mmol/L — ABNORMAL LOW (ref 135–145)
Total Bilirubin: 0.2 mg/dL — ABNORMAL LOW (ref 0.3–1.2)
Total Protein: 6 g/dL — ABNORMAL LOW (ref 6.5–8.1)

## 2020-04-03 LAB — CBC WITH DIFFERENTIAL/PLATELET
Abs Immature Granulocytes: 0.04 10*3/uL (ref 0.00–0.07)
Basophils Absolute: 0.1 10*3/uL (ref 0.0–0.1)
Basophils Relative: 1 %
Eosinophils Absolute: 0.2 10*3/uL (ref 0.0–0.5)
Eosinophils Relative: 3 %
HCT: 29.9 % — ABNORMAL LOW (ref 36.0–46.0)
Hemoglobin: 10.3 g/dL — ABNORMAL LOW (ref 12.0–15.0)
Immature Granulocytes: 1 %
Lymphocytes Relative: 32 %
Lymphs Abs: 2.1 10*3/uL (ref 0.7–4.0)
MCH: 32.2 pg (ref 26.0–34.0)
MCHC: 34.4 g/dL (ref 30.0–36.0)
MCV: 93.4 fL (ref 80.0–100.0)
Monocytes Absolute: 0.8 10*3/uL (ref 0.1–1.0)
Monocytes Relative: 12 %
Neutro Abs: 3.3 10*3/uL (ref 1.7–7.7)
Neutrophils Relative %: 51 %
Platelets: 286 10*3/uL (ref 150–400)
RBC: 3.2 MIL/uL — ABNORMAL LOW (ref 3.87–5.11)
RDW: 13.8 % (ref 11.5–15.5)
WBC: 6.4 10*3/uL (ref 4.0–10.5)
nRBC: 0 % (ref 0.0–0.2)

## 2020-04-03 MED ORDER — FLUOXETINE HCL 10 MG PO CAPS: 10.0000 mg | ORAL_CAPSULE | Freq: Every day | ORAL | 3 refills | Status: DC

## 2020-04-03 MED ORDER — DICYCLOMINE HCL 20 MG PO TABS: 20.0000 mg | ORAL_TABLET | Freq: Three times a day (TID) | ORAL | 0 refills | Status: DC

## 2020-04-03 NOTE — Discharge Summary (Addendum)
Physician Discharge Summary  [name]:[id number] DOB: 1945-10-31 DOA: 03/28/2020  PCP: System, Provider Not In  Admit date: 03/28/2020 Discharge date: 04/03/2020  Time spent: 37 minutes  Recommendations for Outpatient Follow-up:  1. Needs Chem-12, CBC in 1 to 2 weeks 2. Recommend outpatient bereavement counseling 2/2 recent loss of husband-this admission started on Prozac and will need refills on the same 3. Recommend low-dose CT chest pulmonary nodule protocol in 1 year for 5 mm nodule diagnosed this admission 4. 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--found to have severe impaction on admission Recent severe grief reaction bereavement loss of husband Admitted 03/1818 22 by critical care secondary to abrupt seizure in the setting of severe hyponatremia sodium 113 on admission  Hospital Course:  Pleuritic chest pain Her chest pain was clearly musculoskeletal with multiple trigger points in her upper back See below discussion Elevated D-dimer, lung nodule Pulmonary embolism ruled out no further work-up inpatient Outpatient needs low-dose CT scan for 5 mm nodule in 12 months Severe hyponatremia on admission 2/2 diuretics (HCTZ) and potomania Much improved, discontinue fluid replacement 2/23 Seizure from severe hyponatremia on admission One-time occurrence did not  recur Discontinue tramadol which has a risk of seizure HTN Increase clonidine back to home dosing on discharge IBS-previous IBS-D Admitted with severe constipation Keep on bowel regimen MiraLAX 17 g senna 8 Continuing Bentyl 10 3 times daily Resolving hypokalemia Resolved no further replacement required Asthma Continue Brovana 15 mcg twice daily, Pulmicort 0.25 twice daily and Singulair 10 daily Do not appreciate current wheeze Reflux Continue pantoprazole 40 daily Bipolar 2, prolonged grief reaction secondary to loss of husband Continue Xanax 0.25 3 times daily as needed anxiety Continue Prozac 10 daily started this admission    Consultations:  None  Discharge Exam: Vitals:   04/03/20 0530 04/03/20 0817  BP: 133/67   Pulse: (!) 59   Resp: 16   Temp: 97.8 F (36.6 C)   SpO2: 96% 97%    Subj on day of d/c   Patient is awake alert sitting up having porridge with her coffee She has no distress she tells me she still has some mild chest pain in the back of her chest and when she coughs-she also tells me she uses a herbal supplement at home to put on it She had 1 regular stool today without any other issues or blood  General Exam on discharge  Very pleasant awake alert younger than stated age EOMI NCAT no focal deficit CTA B no added sound no wheeze Several trigger points upper scapular region Abdomen soft no rebound no guarding ROM intact moving all 4 limbs equally No lower extremity edema  Discharge Instructions   Discharge Instructions    Diet - low sodium heart healthy   Complete by: As directed    Discharge instructions   Complete by: As directed    Please ensure that you look at your meds carefully  as we have not changed a whole lot but you will need to pick up the prescription for Prozac from your pharmacy I would recommend you follow the advice of the nutritionist to help with your gut issues.  I would also recommend that you get labs in about 1 to 2  weeks in the outpatient setting-this can probably wait until he returned to [place] did not really seem to require a whole lot of assistance getting up and around and were quite independent and it was felt that you did not objectively require physical therapy at home-additionally you can continue all of your prior to admission activities as needed Best of luck and have a good summer   Increase activity slowly   Complete by: As directed      Allergies as of 04/03/2020      Reactions   Azithromycin    Other reaction(s): gi distress, GI intolerance, Other   Levofloxacin Hives, Rash   Sulfa Antibiotics Hives   Red patches on legs years ago  Red patches on legs years ago       Medication List    STOP taking these medications   cephALEXin 500 MG capsule Commonly known as: KEFLEX   diphenhydrAMINE 25 MG tablet Commonly known as: BENADRYL     TAKE these medications   acetaminophen 650 MG CR tablet Commonly known as: TYLENOL Take 650 mg by mouth every 8 (eight) hours as needed for pain.   albuterol 108 (90 Base) MCG/ACT inhaler Commonly known as: VENTOLIN HFA Inhale 2 puffs into the lungs every 6 (six) hours as needed for wheezing or shortness of breath.   alendronate 70 MG tablet Commonly known as: FOSAMAX Take 70 mg by mouth every Friday.   ALPRAZolam 0.25 MG tablet Commonly known as: XANAX Take 0.25 mg by mouth 2 (two) times daily.   BIOTIN PO Take 1 tablet by mouth daily.   budesonide 0.5 MG/2ML nebulizer solution Commonly known as: PULMICORT Take 0.5 mg by nebulization 2 (two) times daily as needed (wheezing).   budesonide-formoterol 160-4.5 MCG/ACT inhaler Commonly known as: SYMBICORT Inhale 2 puffs into the lungs 2 (two) times daily.   cholecalciferol 1000 units tablet Commonly known as: VITAMIN D Take 1,000 Units by mouth daily.   cloNIDine 0.2 MG tablet Commonly known as: CATAPRES Take 0.4 mg by mouth at bedtime.   dextromethorphan 30 MG/5ML  liquid Commonly known as: DELSYM Take 60 mg by mouth at bedtime as needed for cough.   dicyclomine 20 MG tablet Commonly known as: BENTYL Take 1 tablet (20 mg total) by mouth in the morning, at noon, and at bedtime.   ferrous sulfate 324 MG Tbec Take 324 mg by mouth daily with breakfast.   FLUoxetine 10 MG capsule Commonly known as: PROZAC Take 1 capsule (10 mg total) by mouth daily.   guaiFENesin 600 MG 12 hr tablet Commonly known as: MUCINEX Take 1,200 mg by mouth 2 (two) times daily as needed for cough.   latanoprost 0.005 % ophthalmic solution Commonly known as: XALATAN Place 1 drop into both eyes at bedtime.   montelukast 10 MG tablet Commonly known as: SINGULAIR Take 10 mg by mouth at bedtime.   pantoprazole 40 MG tablet Commonly known as: PROTONIX Take 40 mg by mouth daily.   polyethylene glycol 17 g packet Commonly known as: MIRALAX / GLYCOLAX Take 17 g by mouth daily as needed for mild constipation.   simethicone 80 MG chewable tablet Commonly known as: MYLICON Chew 80  mg by mouth every 6 (six) hours as needed for flatulence.   vitamin B-12 1000 MCG tablet Commonly known as: CYANOCOBALAMIN Take 1,000 mcg by mouth daily.      Allergies  Allergen Reactions  . Azithromycin     Other reaction(s): gi distress, GI intolerance, Other  . Levofloxacin Hives and Rash  . Sulfa Antibiotics Hives    Red patches on legs years ago  Red patches on legs years ago        The results of significant diagnostics from this hospitalization (including imaging, microbiology, ancillary and laboratory) are listed below for reference.    Significant Diagnostic Studies: DG Chest 2 View  Result Date: 03/28/2020 CLINICAL DATA:  Possible seizure. EXAM: CHEST - 2 VIEW COMPARISON:  October 02, 2006 FINDINGS: Mild, chronic appearing increased lung markings are seen. Mild atelectasis and/or early infiltrate is seen within the left lung base. There is no evidence of a pleural  effusion or pneumothorax. The cardiac silhouette is borderline in size. The visualized skeletal structures are unremarkable. IMPRESSION: Mild left basilar atelectasis and/or early infiltrate. Electronically Signed   By: Aram Candelahaddeus  Houston M.D.   On: 03/28/2020 23:53   CT HEAD WO CONTRAST  Result Date: 03/29/2020 CLINICAL DATA:  Seizure EXAM: CT HEAD WITHOUT CONTRAST TECHNIQUE: Contiguous axial images were obtained from the base of the skull through the vertex without intravenous contrast. COMPARISON:  None. FINDINGS: Brain: There is no mass, hemorrhage or extra-axial collection. The size and configuration of the ventricles and extra-axial CSF spaces are normal. There is hypoattenuation of the white matter, most commonly indicating chronic small vessel disease. Vascular: No abnormal hyperdensity of the major intracranial arteries or dural venous sinuses. No intracranial atherosclerosis. Skull: The visualized skull base, calvarium and extracranial soft tissues are normal. Sinuses/Orbits: Frothy secretions in the right maxillary sinus. The orbits are normal. IMPRESSION: 1. Chronic small vessel disease without acute intracranial abnormality. 2. Frothy secretions in the right maxillary sinus. Correlate for symptoms of acute sinusitis. Electronically Signed   By: Deatra RobinsonKevin  Herman M.D.   On: 03/29/2020 02:04   CT ANGIO CHEST PE W OR WO CONTRAST  Result Date: 04/02/2020 CLINICAL DATA:  Chest pain EXAM: CT ANGIOGRAPHY CHEST WITH CONTRAST TECHNIQUE: Multidetector CT imaging of the chest was performed using the standard protocol during bolus administration of intravenous contrast. Multiplanar CT image reconstructions and MIPs were obtained to evaluate the vascular anatomy. CONTRAST:  56mL OMNIPAQUE IOHEXOL 350 MG/ML SOLN COMPARISON:  Chest radiograph April 01, 2020 FINDINGS: Cardiovascular: There is no demonstrable pulmonary embolus. There is no thoracic aortic aneurysm or dissection. The visualized great vessels appear  normal. There is mild aortic atherosclerosis. There is no appreciable pericardial effusion or pericardial thickening. Mediastinum/Nodes: Visualized thyroid appears unremarkable. There is no evident thoracic adenopathy. No esophageal lesions are appreciable. Lungs/Pleura: There is atelectatic change with mild associated consolidation in the posterior left base. There is atelectatic change in the posterior right base. There is a small focus of consolidation in the inferior most aspect of the lingula. There is scarring in the apices, more on the right than on the left. There is a focal nodular opacity in the anterior segment of the right upper lobe measuring 5 mm. No similar nodular opacities evident elsewhere. No appreciable pleural effusions. No evident pneumothorax. Trachea and major bronchial structures appear patent. Upper Abdomen: There is a cyst in the periphery of the right lobe of the liver laterally measuring 2.2 x 1.6 cm. There is incomplete visualization of a cyst in  the medial mid left kidney measuring 1.7 x 1.2 cm. Visualized upper abdominal structures appear unremarkable. Musculoskeletal: No blastic or lytic bone lesions are evident. Focus of calcification in the posterior longitudinal ligament noted in midthoracic region causing mild impression on the canal in this area. No chest wall lesions. Review of the MIP images confirms the above findings. IMPRESSION: 1. No evident pulmonary embolus. No thoracic aortic aneurysm or dissection. There is mild aortic atherosclerosis. 2. Atelectatic change in the lung bases. Suspect small focus of pneumonia in the inferior lingula and in theposterior segment of the left lower lobe. 3. 5 mm nodular opacity in the anterior segment of the right upper lobe. No follow-up needed if patient is low-risk. Non-contrast chest CT can be considered in 12 months if patient is high-risk. This recommendation follows the consensus statement: Guidelines for Management of Incidental  Pulmonary Nodules Detected on CT Images: From the Fleischner Society 2017; Radiology 2017; 284:228-243. 4.  No evident adenopathy. Aortic Atherosclerosis (ICD10-I70.0). Electronically Signed   By: Bretta Bang III M.D.   On: 04/02/2020 09:46   CT ABDOMEN PELVIS W CONTRAST  Result Date: 03/28/2020 CLINICAL DATA:  Bloating with rectal pain due to frequent bowel movements. EXAM: CT ABDOMEN AND PELVIS WITH CONTRAST TECHNIQUE: Multidetector CT imaging of the abdomen and pelvis was performed using the standard protocol following bolus administration of intravenous contrast. CONTRAST:  OMNIPAQUE IOHEXOL 300 MG/ML  SOLN COMPARISON:  None. FINDINGS: Lower chest: Mild linear scarring and/or atelectasis is seen within the region of the lingula. Hepatobiliary: A 1.1 cm diameter cystic appearing areas seen within the anteromedial aspect of the right lobe of the liver. An additional 2.1 cm x 1.6 cm cyst is noted within the lateral aspect of the right lobe. No gallstones, gallbladder wall thickening, or biliary dilatation. Pancreas: And 8 mm x 5 mm x 8 mm focus of parenchymal low attenuation is seen within the body of the pancreas (axial CT image 32, CT series number 2 and coronal reformatted image 56, CT series number 5). Spleen: A 3 mm calcified granuloma is noted within the spleen. Adrenals/Urinary Tract: Adrenal glands are unremarkable. Kidneys are normal, without renal calculi or hydronephrosis. A 1.8 cm x 1.2 cm simple cyst is seen within the medial aspect of the mid left kidney. Bladder is unremarkable. Stomach/Bowel: There is a small hiatal hernia. Appendix appears normal. A moderate to marked amount of stool is seen throughout the large bowel. No evidence of bowel wall thickening, distention, or inflammatory changes. Vascular/Lymphatic: No significant vascular findings are present. No enlarged abdominal or pelvic lymph nodes. Reproductive: Status post hysterectomy. No adnexal masses. Other: There is a 2.0  cm x 2.0 cm fat-containing left-sided para umbilical hernia. No abdominopelvic ascites. Musculoskeletal: Marked severity degenerative changes are seen at the level of L5-S1. IMPRESSION: 1. Small hiatal hernia. 2. Hepatic and left renal cysts. 3. Small fat-containing para umbilical hernia. 4. Marked severity degenerative changes at the level of L5-S1. Electronically Signed   By: Aram Candela M.D.   On: 03/28/2020 21:24   DG CHEST PORT 1 VIEW  Result Date: 04/01/2020 CLINICAL DATA:  Pleuritic pain. EXAM: PORTABLE CHEST 1 VIEW COMPARISON:  03/28/2020. FINDINGS: Mediastinum hilar structures normal. Heart size stable. No pulmonary venous congestion. Mild left base atelectasis/infiltrate and tiny left pleural effusion. No pneumothorax. IMPRESSION: Mild left base atelectasis/infiltrate and tiny left pleural effusion. Electronically Signed   By: Maisie Fus  Register   On: 04/01/2020 08:56    Microbiology: Recent Results (from the past  240 hour(s))  SARS CORONAVIRUS 2 (TAT 6-24 HRS) Nasopharyngeal Nasopharyngeal Swab     Status: None   Collection Time: 03/28/20 11:00 PM   Specimen: Nasopharyngeal Swab  Result Value Ref Range Status   SARS Coronavirus 2 NEGATIVE NEGATIVE Final    Comment: (NOTE) SARS-CoV-2 target nucleic acids are NOT DETECTED.  The SARS-CoV-2 RNA is generally detectable in upper and lower respiratory specimens during the acute phase of infection. Negative results do not preclude SARS-CoV-2 infection, do not rule out co-infections with other pathogens, and should not be used as the sole basis for treatment or other patient management decisions. Negative results must be combined with clinical observations, patient history, and epidemiological information. The expected result is Negative.  Fact Sheet for Patients: HairSlick.no  Fact Sheet for Healthcare Providers: quierodirigir.com  This test is not yet approved or cleared by  the [place] FDA and  has been authorized for detection and/or diagnosis of SARS-CoV-2 by FDA under an Emergency Use Authorization (EUA). This EUA will remain  in effect (meaning this test can be used) for the duration of the COVID-19 declaration under Se ction 564(b)(1) of the Act, 21 U.S.C. section 360bbb-3(b)(1), unless the authorization is terminated or revoked sooner.  Performed at [facility], [address], [place], [place] [address]   MRSA PCR Screening     Status: None   Collection Time: 03/29/20  2:39 AM   Specimen: Nasopharyngeal  Result Value Ref Range Status   MRSA by PCR NEGATIVE NEGATIVE Final    Comment:        The GeneXpert MRSA Assay (FDA approved for NASAL specimens only), is one component of a comprehensive MRSA colonization surveillance program. It is not intended to diagnose MRSA infection nor to guide or monitor treatment for MRSA infections. Performed at [facility], [address] [place], [place] [address]      Labs: Basic Metabolic Panel: Recent Labs  Lab 03/31/20 1734 04/01/20 0800 04/01/20 1740 04/02/20 0349 04/03/20 0349  NA 127* 130* 129* 128* 132*  K 4.1 3.9 3.7 4.3 4.3  CL 95* 96* 95* 97* 100  CO2 21* 25 27 22 24   GLUCOSE 104* 98 133* 101* 107*  BUN 9 12 12 14 17   CREATININE 0.53 0.75 0.62 0.62 0.69  CALCIUM 8.8* 9.4 9.3 8.8* 9.2  MG 2.3  --   --   --   --   PHOS 1.9*  --   --   --   --    Liver Function Tests: Recent Labs  Lab 03/28/20 1647 04/03/20 0349  AST 19 31  ALT 15 32  ALKPHOS 50 38  BILITOT 1.0 0.2*  PROT 7.4 6.0*  ALBUMIN 4.1 3.0*   No results for input(s): LIPASE, AMYLASE in the last 168 hours. Recent Labs  Lab 03/28/20 2241  AMMONIA 21   CBC: Recent Labs  Lab 03/28/20 1647 03/29/20 0536 03/30/20 0012 03/31/20 0829 04/03/20 0349  WBC 9.1 10.1 8.5 8.4 6.4  NEUTROABS 7.4  --   --   --  3.3  HGB 12.0 11.9* 10.6* 12.7 10.3*  HCT 32.2* 31.8* 30.1* 34.6* 29.9*  MCV  87.3 86.4 91.2 87.4 93.4  PLT 263 243 223 259 286   Cardiac Enzymes: Recent Labs  Lab 03/28/20 2213  CKTOTAL 140   BNP: BNP (last 3 results) No results for input(s): BNP in the last 8760 hours.  ProBNP (last 3 results) No results for input(s): PROBNP in the last 8760  hours.  CBG: No results for input(s): GLUCAP in the last 168 hours.     Signed:  Rhetta Mura MD   [facility] 04/03/2020, 9:23 AM

## 2020-04-22 DIAGNOSIS — F419 Anxiety disorder, unspecified: Secondary | ICD-10-CM | POA: Insufficient documentation

## 2020-09-04 ENCOUNTER — Emergency Department (HOSPITAL_COMMUNITY)
Admission: EM | Admit: 2020-09-04 | Discharge: 2020-09-05 | Disposition: A | Discharge status: Home / Self Care | Payer: Medicare HMO | Attending: Emergency Medicine | Admitting: Emergency Medicine

## 2020-09-04 ENCOUNTER — Other Ambulatory Visit: Payer: Self-pay

## 2020-09-04 DIAGNOSIS — R030 Elevated blood-pressure reading, without diagnosis of hypertension: Secondary | ICD-10-CM | POA: Diagnosis present

## 2020-09-04 DIAGNOSIS — I158 Other secondary hypertension: Secondary | ICD-10-CM | POA: Insufficient documentation

## 2020-09-04 DIAGNOSIS — I10 Essential (primary) hypertension: Secondary | ICD-10-CM | POA: Diagnosis not present

## 2020-09-04 DIAGNOSIS — Z79899 Other long term (current) drug therapy: Secondary | ICD-10-CM | POA: Diagnosis not present

## 2020-09-04 NOTE — ED Provider Notes (Signed)
[facility]-EMERGENCY DEPT Provider Note  CSN: [id number] Arrival date & time: 09/04/20 2229  Chief Complaint(s) Hypertension  HPI [name] is a 75 y.o. female with a past medical history listed below including hypertension who presents to the emergency department with elevated blood pressure and weird sensation throughout her body which she typically feels with elevated blood pressures that are too high.  Reports that she has been under a lot of stress recently.  States that her husband died recently and she has been trying to manage all her financial issues.  Additionally she had a close friend passed away earlier this week.  Today she took her blood pressure medicine late.  After she started noticing her symptoms she took her blood pressure and noted that it was 180s.  This made her anxious.  On repeat BP check she noted that her level was in the 200s.  This prompted her to come in for evaluation.  She denied any associated chest pain or shortness of breath.  No focal deficits.  Patient reports that she took clonidine and hydralazine for blood pressure states that her symptoms have completely resolved.   Hypertension   Past Medical History Past Medical History:  Diagnosis Date   Hypertension    Sinusitis    Patient Active Problem List   Diagnosis Date Noted   Malnutrition of moderate degree 04/01/2020   Hyponatremia 03/28/2020   Home Medication(s) Prior to Admission medications   Medication Sig Start Date End Date Taking? Authorizing Provider  acetaminophen (TYLENOL) 650 MG CR tablet Take 650 mg by mouth every 8 (eight) hours as needed for pain.   Yes [provider]  albuterol (PROVENTIL HFA;VENTOLIN HFA) 108 (90 BASE) MCG/ACT inhaler Inhale 2 puffs into the lungs every 6 (six) hours as needed for wheezing or shortness of breath.   Yes [provider]  amLODipine (NORVASC) 5 MG tablet Take 5 mg by mouth daily. 07/10/20  Yes [provider]  Azelastine HCl 137 MCG/SPRAY SOLN Place 2 sprays into both nostrils 2 (two) times daily. 08/09/20  Yes [provider]  budesonide (PULMICORT) 0.5 MG/2ML nebulizer solution Take 0.5 mg by nebulization 2 (two) times daily as needed (wheezing).   Yes [provider]  budesonide-formoterol (SYMBICORT) 160-4.5 MCG/ACT inhaler Inhale 2 puffs into the lungs 2 (two) times daily. 03/31/11  Yes [provider]  cholecalciferol (VITAMIN D) 1000 UNITS tablet Take 1,000 Units by mouth daily.   Yes [provider]  cloNIDine (CATAPRES) 0.2 MG tablet Take 0.4 mg by mouth 2 (two) times daily. 02/16/20  Yes [provider]  dicyclomine (BENTYL) 20 MG tablet Take 1 tablet (20 mg total) by mouth in the morning, at noon, and at bedtime. 04/03/20  Yes Rhetta Mura, MD  FLUoxetine (PROZAC) 10 MG capsule Take 1 capsule (10 mg total) by mouth daily. 04/03/20  Yes Rhetta Mura, MD  GARLIC PO Take 1 tablet by mouth daily.   Yes [provider]  hydrALAZINE (APRESOLINE) 25 MG tablet Take 25 mg by mouth as needed. As needed for high blood pressure reading 06/20/20  Yes [provider]  loratadine (CLARITIN) 10 MG tablet Take 10 mg by mouth daily. 08/15/20  Yes [provider]  montelukast (SINGULAIR) 10 MG tablet Take 10 mg by mouth at bedtime.  06/20/14  Yes [provider]  mupirocin ointment (BACTROBAN) 2 % Apply 1 application topically as needed.   Yes [provider]  pantoprazole (PROTONIX) 40 MG  tablet Take 40 mg by mouth daily. 02/03/20  Yes [provider]  pantoprazole (PROTONIX) 40 MG tablet Take 1 tablet by mouth daily. 08/15/20  Yes [provider]  PRESCRIPTION MEDICATION Take 5 tablets by mouth daily as needed (to prevent thrush). Clotriamazole troche 10mg  Tablet   Yes [provider]  traZODone (DESYREL) 50 MG tablet Take 50 mg by mouth at bedtime as needed. 08/15/20  Yes  [provider]  valsartan (DIOVAN) 160 MG tablet Take 160 mg by mouth daily. 06/29/20  Yes [provider]  vitamin B-12 (CYANOCOBALAMIN) 1000 MCG tablet Take 1,000 mcg by mouth daily.   Yes [provider]  vitamin C (ASCORBIC ACID) 500 MG tablet Take 500 mg by mouth daily.   Yes [provider]  Zinc 50 MG CAPS Take 1 capsule by mouth daily. 08/15/20  Yes [provider]  alendronate (FOSAMAX) 70 MG tablet Take 70 mg by mouth every Friday. Patient not taking: Reported on 09/04/2020 12/18/19   [provider]  BIOTIN PO Take 1 tablet by mouth daily. Patient not taking: Reported on 09/04/2020    [provider]  dextromethorphan (DELSYM) 30 MG/5ML liquid Take 60 mg by mouth at bedtime as needed for cough. Patient not taking: No sig reported    [provider]                                                                                                                                    Past Surgical History Past Surgical History:  Procedure Laterality Date   CATARACT EXTRACTION     NASAL SINUS SURGERY     TUBAL LIGATION     Family History Family History  Problem Relation Age of Onset   Stroke Mother    Hypertension Mother    Hypertension Other     Social History Social History   Tobacco Use   Smoking status: Never  Substance Use Topics   Alcohol use: No   Drug use: No   Allergies Azithromycin, Levofloxacin, and Sulfa antibiotics  Review of Systems Review of Systems All other systems are reviewed and are negative for acute change except as noted in the HPI  Physical Exam Vital Signs  I have reviewed the triage vital signs BP 135/71   Pulse 61   Temp 98.1 F (36.7 C) (Oral)   Resp 16   Ht 5' 4.5" (1.638 m)   Wt 55.8 kg   SpO2 98%   BMI 20.79 kg/m   Physical Exam Vitals reviewed.  Constitutional:      General: She is not in acute distress.    Appearance: She is well-developed. She is not  diaphoretic.  HENT:     Head: Normocephalic and atraumatic.     Nose: Nose normal.  Eyes:     General: No scleral icterus.       Right eye: No discharge.  Left eye: No discharge.     Conjunctiva/sclera: Conjunctivae normal.     Pupils: Pupils are equal, round, and reactive to light.  Cardiovascular:     Rate and Rhythm: Normal rate and regular rhythm.     Heart sounds: No murmur heard.   No friction rub. No gallop.  Pulmonary:     Effort: Pulmonary effort is normal. No respiratory distress.     Breath sounds: Normal breath sounds. No stridor. No rales.  Abdominal:     General: There is no distension.     Palpations: Abdomen is soft.     Tenderness: There is no abdominal tenderness.  Musculoskeletal:        General: No tenderness.     Cervical back: Normal range of motion and neck supple.  Skin:    General: Skin is warm and dry.     Findings: No erythema or rash.  Neurological:     Mental Status: She is alert and oriented to person, place, and time.    ED Results and Treatments Labs (all labs ordered are listed, but only abnormal results are displayed) Labs Reviewed - No data to display                                                                                                                       EKG  EKG Interpretation  Date/Time:    Ventricular Rate:    PR Interval:    QRS Duration:   QT Interval:    QTC Calculation:   R Axis:     Text Interpretation:         Radiology No results found.  Pertinent labs & imaging results that were available during my care of the patient were reviewed by me and considered in my medical decision making (see chart for details).  Medications Ordered in ED Medications - No data to display                                                                                                                                  Procedures Procedures  (including critical care time)  Medical Decision Making / ED Course I have  reviewed the nursing notes for this encounter and the patient's prior records (if available in EHR or on provided paperwork).   [name] was evaluated in Emergency Department on 09/05/2020 for the symptoms described in the history of present illness. She was  evaluated in the context of the global COVID-19 pandemic, which necessitated consideration that the patient might be at risk for infection with the SARS-CoV-2 virus that causes COVID-19. Institutional protocols and algorithms that pertain to the evaluation of patients at risk for COVID-19 are in a state of rapid change based on information released by regulatory bodies including the CDC and federal and state organizations. These policies and algorithms were followed during the patient's care in the ED.  Elevated blood pressure in the 200s resolved with home medication.  Currently patient is asymptomatic.  BP here was down to the 160s.  Repeat into the 130s.  Remained stable.  She remains asymptomatic.  No suspicion for endorgan damage.      Final Clinical Impression(s) / ED Diagnoses Final diagnoses:  Other secondary hypertension   The patient appears reasonably screened and/or stabilized for discharge and I doubt any other medical condition or other [facility] requiring further screening, evaluation, or treatment in the ED at this time prior to discharge. Safe for discharge with strict return precautions.  Disposition: Discharge  Condition: Good  I have discussed the results, Dx and Tx plan with the patient/family who expressed understanding and agree(s) with the plan. Discharge instructions discussed at length. The patient/family was given strict return precautions who verbalized understanding of the instructions. No further questions at time of discharge.    ED Discharge Orders     None        Follow Up: Primary care provider  Call  as needed     This chart was dictated using voice recognition software.  Despite best  efforts to proofread,  errors can occur which can change the documentation meaning.    Nira Connardama, Kiera Hussey Eduardo, MD 09/05/20 [phone]

## 2020-09-04 NOTE — ED Triage Notes (Signed)
Patient is from home A&Ox[facility]. Patient is here for hypertension. BP has been elevated since this evening. She has taken all of her home medication for her bp but stated she came in because her bp has not been this high in awhile.

## 2020-10-28 ENCOUNTER — Other Ambulatory Visit: Payer: Self-pay

## 2020-10-28 ENCOUNTER — Emergency Department (HOSPITAL_COMMUNITY)
Admission: EM | Admit: 2020-10-28 | Discharge: 2020-10-29 | Disposition: A | Discharge status: Home / Self Care | Payer: Medicare HMO | Attending: Emergency Medicine | Admitting: Emergency Medicine

## 2020-10-28 DIAGNOSIS — Z79899 Other long term (current) drug therapy: Secondary | ICD-10-CM | POA: Diagnosis not present

## 2020-10-28 DIAGNOSIS — I10 Essential (primary) hypertension: Secondary | ICD-10-CM | POA: Diagnosis not present

## 2020-10-28 NOTE — ED Triage Notes (Signed)
Pt states that her blood pressure was high earlier today. BP upon triage 153/76.

## 2020-10-29 ENCOUNTER — Emergency Department (HOSPITAL_COMMUNITY): Payer: Medicare HMO

## 2020-10-29 LAB — CBC WITH DIFFERENTIAL/PLATELET
Abs Immature Granulocytes: 0.03 10*3/uL (ref 0.00–0.07)
Basophils Absolute: 0.1 10*3/uL (ref 0.0–0.1)
Basophils Relative: 1 %
Eosinophils Absolute: 0.2 10*3/uL (ref 0.0–0.5)
Eosinophils Relative: 2 %
HCT: 38.5 % (ref 36.0–46.0)
Hemoglobin: 12.7 g/dL (ref 12.0–15.0)
Immature Granulocytes: 0 %
Lymphocytes Relative: 27 %
Lymphs Abs: 2.3 10*3/uL (ref 0.7–4.0)
MCH: 31.1 pg (ref 26.0–34.0)
MCHC: 33 g/dL (ref 30.0–36.0)
MCV: 94.1 fL (ref 80.0–100.0)
Monocytes Absolute: 0.8 10*3/uL (ref 0.1–1.0)
Monocytes Relative: 9 %
Neutro Abs: 5.1 10*3/uL (ref 1.7–7.7)
Neutrophils Relative %: 61 %
Platelets: 266 10*3/uL (ref 150–400)
RBC: 4.09 MIL/uL (ref 3.87–5.11)
RDW: 14.1 % (ref 11.5–15.5)
WBC: 8.4 10*3/uL (ref 4.0–10.5)
nRBC: 0 % (ref 0.0–0.2)

## 2020-10-29 LAB — I-STAT CHEM 8, ED
BUN: 7 mg/dL — ABNORMAL LOW (ref 8–23)
Calcium, Ion: 1.36 mmol/L (ref 1.15–1.40)
Chloride: 102 mmol/L (ref 98–111)
Creatinine, Ser: 0.6 mg/dL (ref 0.44–1.00)
Glucose, Bld: 90 mg/dL (ref 70–99)
HCT: 39 % (ref 36.0–46.0)
Hemoglobin: 13.3 g/dL (ref 12.0–15.0)
Potassium: 3.8 mmol/L (ref 3.5–5.1)
Sodium: 139 mmol/L (ref 135–145)
TCO2: 31 mmol/L (ref 22–32)

## 2020-10-29 NOTE — ED Provider Notes (Signed)
[facility]-EMERGENCY DEPT Provider Note   CSN: [id number] Arrival date & time: 10/28/20  2131     History Chief Complaint  Patient presents with   Hypertension    [name] is a 75 y.o. female.  The history is provided by the patient.  Hypertension This is a chronic problem. The current episode started more than 1 week ago. The problem occurs constantly. Progression since onset: waxing and waning. Pertinent negatives include no chest pain, no abdominal pain, no headaches and no shortness of breath. Nothing aggravates the symptoms. Nothing relieves the symptoms. She has tried nothing for the symptoms. The treatment provided significant relief.      Past Medical History:  Diagnosis Date   Hypertension    Sinusitis     Patient Active Problem List   Diagnosis Date Noted   Malnutrition of moderate degree 04/01/2020   Hyponatremia 03/28/2020    Past Surgical History:  Procedure Laterality Date   CATARACT EXTRACTION     NASAL SINUS SURGERY     TUBAL LIGATION       OB History   No obstetric history on file.     Family History  Problem Relation Age of Onset   Stroke Mother    Hypertension Mother    Hypertension Other     Social History   Tobacco Use   Smoking status: Never  Substance Use Topics   Alcohol use: No   Drug use: No    Home Medications Prior to Admission medications   Medication Sig Start Date End Date Taking? Authorizing Provider  acetaminophen (TYLENOL) 650 MG CR tablet Take 650 mg by mouth every 8 (eight) hours as needed for pain.    [provider]  albuterol (PROVENTIL HFA;VENTOLIN HFA) 108 (90 BASE) MCG/ACT inhaler Inhale 2 puffs into the lungs every 6 (six) hours as needed for wheezing or shortness of breath.    [provider]  alendronate (FOSAMAX) 70 MG tablet Take 70 mg by mouth every Friday. Patient not taking: Reported on 09/04/2020 12/18/19   [provider]  amLODipine (NORVASC)  5 MG tablet Take 5 mg by mouth daily. 07/10/20   [provider]  Azelastine HCl 137 MCG/SPRAY SOLN Place 2 sprays into both nostrils 2 (two) times daily. 08/09/20   [provider]  BIOTIN PO Take 1 tablet by mouth daily. Patient not taking: Reported on 09/04/2020    [provider]  budesonide (PULMICORT) 0.5 MG/2ML nebulizer solution Take 0.5 mg by nebulization 2 (two) times daily as needed (wheezing).    [provider]  budesonide-formoterol (SYMBICORT) 160-4.5 MCG/ACT inhaler Inhale 2 puffs into the lungs 2 (two) times daily. 03/31/11   [provider]  cholecalciferol (VITAMIN D) 1000 UNITS tablet Take 1,000 Units by mouth daily.    [provider]  cloNIDine (CATAPRES) 0.2 MG tablet Take 0.4 mg by mouth 2 (two) times daily. 02/16/20   [provider]  dextromethorphan (DELSYM) 30 MG/5ML liquid Take 60 mg by mouth at bedtime as needed for cough. Patient not taking: No sig reported    [provider]  dicyclomine (BENTYL) 20 MG tablet Take 1 tablet (20 mg total) by mouth in the morning, at noon, and at bedtime. 04/03/20   Rhetta Mura, MD  FLUoxetine (PROZAC) 10 MG capsule Take 1 capsule (10 mg total) by mouth daily. 04/03/20   Rhetta Mura, MD  GARLIC PO Take 1 tablet by mouth daily.    [provider]  hydrALAZINE (APRESOLINE) 25 MG tablet Take 25 mg by mouth as needed. As needed for high blood pressure reading 06/20/20   [provider]  loratadine (CLARITIN) 10 MG tablet Take 10 mg by mouth daily. 08/15/20   [provider]  montelukast (SINGULAIR) 10 MG tablet Take 10 mg by mouth at bedtime.  06/20/14   [provider]  mupirocin ointment (BACTROBAN) 2 % Apply 1 application topically as needed.    [provider]  pantoprazole (PROTONIX) 40 MG tablet Take 40 mg by mouth daily. 02/03/20   [provider]  pantoprazole (PROTONIX) 40 MG tablet Take 1 tablet by  mouth daily. 08/15/20   [provider]  PRESCRIPTION MEDICATION Take 5 tablets by mouth daily as needed (to prevent thrush). Clotriamazole troche 10mg  Tablet    [provider]  traZODone (DESYREL) 50 MG tablet Take 50 mg by mouth at bedtime as needed. 08/15/20   [provider]  valsartan (DIOVAN) 160 MG tablet Take 160 mg by mouth daily. 06/29/20   [provider]  vitamin B-12 (CYANOCOBALAMIN) 1000 MCG tablet Take 1,000 mcg by mouth daily.    [provider]  vitamin C (ASCORBIC ACID) 500 MG tablet Take 500 mg by mouth daily.    [provider]  Zinc 50 MG CAPS Take 1 capsule by mouth daily. 08/15/20   [provider]    Allergies    Azithromycin, Levofloxacin, and Sulfa antibiotics  Review of Systems   Review of Systems  Constitutional:  Negative for fever.  HENT:  Negative for drooling.   Eyes:  Negative for redness.  Respiratory:  Negative for shortness of breath.   Cardiovascular:  Negative for chest pain.  Gastrointestinal:  Negative for abdominal pain.  Genitourinary:  Negative for difficulty urinating.  Musculoskeletal:  Negative for neck pain.  Skin:  Negative for rash.  Neurological:  Negative for facial asymmetry and headaches.  Psychiatric/Behavioral:  Negative for agitation.   All other systems reviewed and are negative.  Physical Exam Updated Vital Signs BP (!) 153/76   Pulse 63   Temp 98 F (36.7 C) (Oral)   Resp 20   SpO2 100%   Physical Exam Vitals and nursing note reviewed.  Constitutional:      General: She is not in acute distress.    Appearance: Normal appearance.  HENT:     Head: Normocephalic and atraumatic.     Nose: Nose normal.  Eyes:     Conjunctiva/sclera: Conjunctivae normal.     Pupils: Pupils are equal, round, and reactive to light.  Cardiovascular:     Rate and Rhythm: Normal rate and regular rhythm.     Pulses: Normal pulses.     Heart sounds: Normal heart sounds.  Pulmonary:      Effort: Pulmonary effort is normal.     Breath sounds: Normal breath sounds.  Abdominal:     General: Abdomen is flat. Bowel sounds are normal.     Palpations: Abdomen is soft.     Tenderness: There is no abdominal tenderness. There is no guarding.  Musculoskeletal:        General: Normal range of motion.     Cervical back: Normal range of motion and neck supple.  Skin:    General: Skin is warm and dry.     Capillary Refill: Capillary refill takes less than 2 seconds.  Neurological:     General: No focal deficit present.     Mental Status: She is alert and  oriented to person, place, and time.     Deep Tendon Reflexes: Reflexes normal.  Psychiatric:        Mood and Affect: Mood normal.        Behavior: Behavior normal.    ED Results / Procedures / Treatments   Labs (all labs ordered are listed, but only abnormal results are displayed) Labs Reviewed  I-STAT CHEM 8, ED - Abnormal; Notable for the following components:      Result Value   BUN 7 (*)    All other components within normal limits  CBC WITH DIFFERENTIAL/PLATELET    EKG None  Radiology No results found.  Procedures Procedures   Medications Ordered in ED Medications - No data to display  ED Course  I have reviewed the triage vital signs and the nursing notes.  Pertinent labs & imaging results that were available during my care of the patient were reviewed by me and considered in my medical decision making (see chart for details).   Asymptomatic but is checking her pressure frequently, meds are currently being adjusted by PMD.  Have urged patient to call today to discuss this further.     SYLVIE MIFSUD was evaluated in Emergency Department on 10/29/2020 for the symptoms described in the history of present illness. She was evaluated in the context of the global COVID-19 pandemic, which necessitated consideration that the patient might be at risk for infection with the SARS-CoV-2 virus that causes  COVID-19. Institutional protocols and algorithms that pertain to the evaluation of patients at risk for COVID-19 are in a state of rapid change based on information released by regulatory bodies including the CDC and federal and state organizations. These policies and algorithms were followed during the patient's care in the ED.  Final Clinical Impression(s) / ED Diagnoses Final diagnoses:  Primary hypertension  Return for intractable cough, coughing up blood, fevers > 100.4 unrelieved by medication, shortness of breath, intractable vomiting, chest pain, shortness of breath, weakness, numbness, changes in speech, facial asymmetry, abdominal pain, passing out, Inability to tolerate liquids or food, cough, altered mental status or any concerns. No signs of systemic illness or infection. The patient is nontoxic-appearing on exam and vital signs are within normal limits. I have reviewed the triage vital signs and the nursing notes. Pertinent labs & imaging results that were available during my care of the patient were reviewed by me and considered in my medical decision making (see chart for details). After history, exam, and medical workup I feel the patient has been appropriately medically screened and is safe for discharge home. Pertinent diagnoses were discussed with the patient. Patient was given return precautions.  Rx / DC Orders ED Discharge Orders     None        Raylynn Hersh, MD 10/29/20 [phone]

## 2020-10-29 NOTE — ED Notes (Signed)
Discharge instructions discussed with pt. Pt verbalized understanding with no questions at this time. Pt to go home with sister 

## 2020-10-29 NOTE — ED Notes (Signed)
XR at bedside

## 2021-07-30 ENCOUNTER — Emergency Department (HOSPITAL_COMMUNITY): Payer: Medicare HMO

## 2021-07-30 ENCOUNTER — Encounter (HOSPITAL_COMMUNITY): Payer: Self-pay | Admitting: Emergency Medicine

## 2021-07-30 ENCOUNTER — Inpatient Hospital Stay (HOSPITAL_COMMUNITY)
Admission: EM | Admit: 2021-07-30 | Discharge: 2021-08-01 | DRG: 244 | Disposition: A | Discharge status: Home / Self Care | Payer: Medicare HMO | Attending: Internal Medicine | Admitting: Internal Medicine

## 2021-07-30 DIAGNOSIS — Z823 Family history of stroke: Secondary | ICD-10-CM

## 2021-07-30 DIAGNOSIS — H409 Unspecified glaucoma: Secondary | ICD-10-CM | POA: Diagnosis present

## 2021-07-30 DIAGNOSIS — J45909 Unspecified asthma, uncomplicated: Secondary | ICD-10-CM | POA: Diagnosis present

## 2021-07-30 DIAGNOSIS — I442 Atrioventricular block, complete: Secondary | ICD-10-CM | POA: Diagnosis present

## 2021-07-30 DIAGNOSIS — I10 Essential (primary) hypertension: Secondary | ICD-10-CM | POA: Diagnosis present

## 2021-07-30 DIAGNOSIS — Z8249 Family history of ischemic heart disease and other diseases of the circulatory system: Secondary | ICD-10-CM

## 2021-07-30 DIAGNOSIS — Z881 Allergy status to other antibiotic agents status: Secondary | ICD-10-CM | POA: Diagnosis not present

## 2021-07-30 DIAGNOSIS — Z882 Allergy status to sulfonamides status: Secondary | ICD-10-CM

## 2021-07-30 DIAGNOSIS — Z79899 Other long term (current) drug therapy: Secondary | ICD-10-CM | POA: Diagnosis not present

## 2021-07-30 DIAGNOSIS — I4430 Unspecified atrioventricular block: Secondary | ICD-10-CM | POA: Diagnosis present

## 2021-07-30 DIAGNOSIS — Z634 Disappearance and death of family member: Secondary | ICD-10-CM

## 2021-07-30 DIAGNOSIS — K589 Irritable bowel syndrome without diarrhea: Secondary | ICD-10-CM | POA: Diagnosis present

## 2021-07-30 DIAGNOSIS — R001 Bradycardia, unspecified: Secondary | ICD-10-CM | POA: Diagnosis present

## 2021-07-30 DIAGNOSIS — I441 Atrioventricular block, second degree: Secondary | ICD-10-CM | POA: Diagnosis not present

## 2021-07-30 DIAGNOSIS — F419 Anxiety disorder, unspecified: Secondary | ICD-10-CM | POA: Diagnosis present

## 2021-07-30 DIAGNOSIS — Z888 Allergy status to other drugs, medicaments and biological substances status: Secondary | ICD-10-CM | POA: Diagnosis not present

## 2021-07-30 DIAGNOSIS — Z950 Presence of cardiac pacemaker: Secondary | ICD-10-CM | POA: Diagnosis not present

## 2021-07-30 DIAGNOSIS — Z7951 Long term (current) use of inhaled steroids: Secondary | ICD-10-CM

## 2021-07-30 DIAGNOSIS — K219 Gastro-esophageal reflux disease without esophagitis: Secondary | ICD-10-CM | POA: Diagnosis present

## 2021-07-30 DIAGNOSIS — E785 Hyperlipidemia, unspecified: Secondary | ICD-10-CM | POA: Diagnosis present

## 2021-07-30 LAB — CBC WITH DIFFERENTIAL/PLATELET
Abs Immature Granulocytes: 0.02 10*3/uL (ref 0.00–0.07)
Basophils Absolute: 0.1 10*3/uL (ref 0.0–0.1)
Basophils Relative: 1 %
Eosinophils Absolute: 0.1 10*3/uL (ref 0.0–0.5)
Eosinophils Relative: 1 %
HCT: 39.8 % (ref 36.0–46.0)
Hemoglobin: 13.3 g/dL (ref 12.0–15.0)
Immature Granulocytes: 0 %
Lymphocytes Relative: 17 %
Lymphs Abs: 1.4 10*3/uL (ref 0.7–4.0)
MCH: 30.9 pg (ref 26.0–34.0)
MCHC: 33.4 g/dL (ref 30.0–36.0)
MCV: 92.3 fL (ref 80.0–100.0)
Monocytes Absolute: 0.6 10*3/uL (ref 0.1–1.0)
Monocytes Relative: 7 %
Neutro Abs: 6.1 10*3/uL (ref 1.7–7.7)
Neutrophils Relative %: 74 %
Platelets: 255 10*3/uL (ref 150–400)
RBC: 4.31 MIL/uL (ref 3.87–5.11)
RDW: 14 % (ref 11.5–15.5)
WBC: 8.3 10*3/uL (ref 4.0–10.5)
nRBC: 0 % (ref 0.0–0.2)

## 2021-07-30 LAB — BASIC METABOLIC PANEL
Anion gap: 10 (ref 5–15)
BUN: 16 mg/dL (ref 8–23)
CO2: 23 mmol/L (ref 22–32)
Calcium: 10.2 mg/dL (ref 8.9–10.3)
Chloride: 103 mmol/L (ref 98–111)
Creatinine, Ser: 0.7 mg/dL (ref 0.44–1.00)
GFR, Estimated: >60 mL/min (ref >60)
Glucose, Bld: 110 mg/dL — ABNORMAL HIGH (ref 70–99)
Potassium: 4.1 mmol/L (ref 3.5–5.1)
Sodium: 136 mmol/L (ref 135–145)

## 2021-07-30 LAB — TSH: TSH: 1.512 u[IU]/mL (ref 0.350–4.500)

## 2021-07-30 LAB — MAGNESIUM: Magnesium: 2.1 mg/dL (ref 1.7–2.4)

## 2021-07-30 MED ORDER — IRBESARTAN 300 MG PO TABS
150.0000 mg | ORAL_TABLET | Freq: Every day | ORAL | Status: DC
  Filled 2021-07-30: qty 1

## 2021-07-30 MED ORDER — FLUOXETINE HCL 10 MG PO CAPS: 10.0000 mg | ORAL_CAPSULE | Freq: Every day | ORAL | Status: DC

## 2021-07-30 MED ORDER — MONTELUKAST SODIUM 10 MG PO TABS
10.0000 mg | ORAL_TABLET | Freq: Every day | ORAL | Status: DC
  Administered 2021-07-31: 10 mg via ORAL
  Filled 2021-07-30 (×2): qty 1

## 2021-07-30 MED ORDER — ASCORBIC ACID 500 MG PO TABS
500.0000 mg | ORAL_TABLET | Freq: Every day | ORAL | Status: DC
  Administered 2021-08-01: 500 mg via ORAL
  Filled 2021-07-30 (×2): qty 1

## 2021-07-30 MED ORDER — DICYCLOMINE HCL 20 MG PO TABS
20.0000 mg | ORAL_TABLET | Freq: Two times a day (BID) | ORAL | Status: DC
  Administered 2021-07-30 – 2021-08-01 (×3): 20 mg via ORAL
  Filled 2021-07-30 (×5): qty 1

## 2021-07-30 MED ORDER — CLONIDINE HCL 0.2 MG PO TABS: 0.3000 mg | ORAL_TABLET | Freq: Two times a day (BID) | ORAL | Status: DC

## 2021-07-30 MED ORDER — ZINC SULFATE 220 (50 ZN) MG PO CAPS
220.0000 mg | ORAL_CAPSULE | Freq: Every day | ORAL | Status: DC
  Administered 2021-08-01: 220 mg via ORAL
  Filled 2021-07-30: qty 1

## 2021-07-30 MED ORDER — PANTOPRAZOLE SODIUM 40 MG PO TBEC
40.0000 mg | DELAYED_RELEASE_TABLET | Freq: Every day | ORAL | Status: DC
  Administered 2021-08-01: 40 mg via ORAL
  Filled 2021-07-30 (×2): qty 1

## 2021-07-30 MED ORDER — VITAMIN D 25 MCG (1000 UNIT) PO TABS
1000.0000 [IU] | ORAL_TABLET | Freq: Every day | ORAL | Status: DC
  Administered 2021-08-01: 1000 [IU] via ORAL
  Filled 2021-07-30: qty 1

## 2021-07-30 MED ORDER — VITAMIN B-12 1000 MCG PO TABS
1000.0000 ug | ORAL_TABLET | Freq: Every day | ORAL | Status: DC
  Administered 2021-08-01: 1000 ug via ORAL
  Filled 2021-07-30 (×2): qty 1

## 2021-07-30 MED ORDER — BUDESONIDE 0.5 MG/2ML IN SUSP: 0.5000 mg | Freq: Two times a day (BID) | RESPIRATORY_TRACT | Status: DC | PRN

## 2021-07-30 MED ORDER — TRAZODONE HCL 50 MG PO TABS
50.0000 mg | ORAL_TABLET | Freq: Every evening | ORAL | Status: DC | PRN
  Administered 2021-07-30: 50 mg via ORAL
  Filled 2021-07-30: qty 1

## 2021-07-30 MED ORDER — IRBESARTAN 150 MG PO TABS: 150.0000 mg | ORAL_TABLET | Freq: Every day | ORAL | Status: DC

## 2021-07-30 MED ORDER — AMLODIPINE BESYLATE 5 MG PO TABS
5.0000 mg | ORAL_TABLET | Freq: Every day | ORAL | Status: DC
  Filled 2021-07-30: qty 1

## 2021-07-30 MED ORDER — ALBUTEROL SULFATE (2.5 MG/3ML) 0.083% IN NEBU: 2.5000 mg | INHALATION_SOLUTION | Freq: Four times a day (QID) | RESPIRATORY_TRACT | Status: DC | PRN

## 2021-07-30 MED ORDER — ALBUTEROL SULFATE HFA 108 (90 BASE) MCG/ACT IN AERS: 2.0000 | INHALATION_SPRAY | Freq: Four times a day (QID) | RESPIRATORY_TRACT | Status: DC | PRN

## 2021-07-30 MED ORDER — AZELASTINE HCL 0.1 % NA SOLN
2.0000 | Freq: Two times a day (BID) | NASAL | Status: DC
  Filled 2021-07-30: qty 30

## 2021-07-30 MED ORDER — LORATADINE 10 MG PO TABS: 10.0000 mg | ORAL_TABLET | Freq: Every day | ORAL | Status: DC

## 2021-07-30 MED ORDER — ACETAMINOPHEN 325 MG PO TABS
650.0000 mg | ORAL_TABLET | Freq: Three times a day (TID) | ORAL | Status: DC | PRN
  Administered 2021-07-31 – 2021-08-01 (×2): 650 mg via ORAL
  Filled 2021-07-30 (×3): qty 2

## 2021-07-30 NOTE — ED Triage Notes (Signed)
Patient states she was referred to the ED to have a pacemaker implanted. Patient has no complaints at this time and is alert, oriented, and in no apparent distress.

## 2021-07-30 NOTE — H&P (Signed)
Cardiology Admission History and Physical:   Patient ID: [name] MRN: [id number]; DOB: 1945/08/04   Admission date: 07/30/2021  PCP:  Knox Royalty, MD   [facility] Providers Cardiologist:  None        Chief Complaint:  2:1 AV block  Patient Profile:   [name] is a 76 y.o. female with Asthma, seasonal allergies, HTN, GERD, anxiety who is being seen 07/30/2021 for the evaluation of 2:1  AV  block.  History of Present Illness:    [name] is a 76 y.o. female with Asthma, seasonal allergies, HTN, GERD, anxiety who is being seen 07/30/2021 for the evaluation of 2:1  AV  block.  Patient reports a lot of stress since her husband passed away suddenly since last year. She relocated from [place] here and since last few days has noticed extreme fatigue, unable to do things which she was doing in the past, unable to ride a bike. She also noticed some diziness but no syncope/prescyncope. Went to PCP and prior Cardiologist, noticed HR in 40s but did not need anything. Her symptoms got worse today, so she went to [facility], saw Dr Rosita Kea who sent her here for direct admission for 2:1 AV block and need for PPM  Patient denies any prior cardiac history, not on AV blockers, no eye drops, denies any drug use  EKG; 2:1 AV block, HR 45   Past Medical History:  Diagnosis Date   Hypertension    Sinusitis     Past Surgical History:  Procedure Laterality Date   CATARACT EXTRACTION     NASAL SINUS SURGERY     TUBAL LIGATION       Medications Prior to Admission: Prior to Admission medications   Medication Sig Start Date End Date Taking? Authorizing Provider  acetaminophen (TYLENOL) 650 MG CR tablet Take 650 mg by mouth every 8 (eight) hours as needed for pain.    [provider]  albuterol (PROVENTIL HFA;VENTOLIN HFA) 108 (90 BASE) MCG/ACT inhaler Inhale 2 puffs into the lungs every 6 (six) hours as needed for wheezing or shortness of breath.     [provider]  alendronate (FOSAMAX) 70 MG tablet Take 70 mg by mouth every Friday. Patient not taking: Reported on 09/04/2020 12/18/19   [provider]  amLODipine (NORVASC) 5 MG tablet Take 5 mg by mouth daily. 07/10/20   [provider]  Azelastine HCl 137 MCG/SPRAY SOLN Place 2 sprays into both nostrils 2 (two) times daily. 08/09/20   [provider]  BIOTIN PO Take 1 tablet by mouth daily. Patient not taking: Reported on 09/04/2020    [provider]  budesonide (PULMICORT) 0.5 MG/2ML nebulizer solution Take 0.5 mg by nebulization 2 (two) times daily as needed (wheezing).    [provider]  budesonide-formoterol (SYMBICORT) 160-4.5 MCG/ACT inhaler Inhale 2 puffs into the lungs 2 (two) times daily. 03/31/11   [provider]  cholecalciferol (VITAMIN D) 1000 UNITS tablet Take 1,000 Units by mouth daily.    [provider]  cloNIDine (CATAPRES) 0.2 MG tablet Take 0.4 mg by mouth 2 (two) times daily. 02/16/20   [provider]  dextromethorphan (DELSYM) 30 MG/5ML liquid Take 60 mg by mouth at bedtime as needed for cough. Patient not taking: No sig reported    [provider]  dicyclomine (BENTYL) 20 MG tablet Take 1 tablet (20 mg total) by mouth in the morning, at noon, and at bedtime. 04/03/20   Samtani,  Jai-Gurmukh, MD  FLUoxetine (PROZAC) 10 MG capsule Take 1 capsule (10 mg total) by mouth daily. 04/03/20   Rhetta Mura, MD  GARLIC PO Take 1 tablet by mouth daily.    [provider]  hydrALAZINE (APRESOLINE) 25 MG tablet Take 25 mg by mouth as needed. As needed for high blood pressure reading 06/20/20   [provider]  loratadine (CLARITIN) 10 MG tablet Take 10 mg by mouth daily. 08/15/20   [provider]  montelukast (SINGULAIR) 10 MG tablet Take 10 mg by mouth at bedtime.  06/20/14   [provider]  mupirocin ointment (BACTROBAN) 2 % Apply 1 application topically as  needed.    [provider]  pantoprazole (PROTONIX) 40 MG tablet Take 40 mg by mouth daily. 02/03/20   [provider]  pantoprazole (PROTONIX) 40 MG tablet Take 1 tablet by mouth daily. 08/15/20   [provider]  PRESCRIPTION MEDICATION Take 5 tablets by mouth daily as needed (to prevent thrush). Clotriamazole troche 10mg  Tablet    [provider]  traZODone (DESYREL) 50 MG tablet Take 50 mg by mouth at bedtime as needed. 08/15/20   [provider]  valsartan (DIOVAN) 160 MG tablet Take 160 mg by mouth daily. 06/29/20   [provider]  vitamin B-12 (CYANOCOBALAMIN) 1000 MCG tablet Take 1,000 mcg by mouth daily.    [provider]  vitamin C (ASCORBIC ACID) 500 MG tablet Take 500 mg by mouth daily.    [provider]  Zinc 50 MG CAPS Take 1 capsule by mouth daily. 08/15/20   [provider]     Allergies:    Allergies  Allergen Reactions   Azithromycin     Other reaction(s): gi distress, GI intolerance, Other   Levofloxacin Hives and Rash   Sulfa Antibiotics Hives    Red patches on legs years ago  Red patches on legs years ago      Social History:   Social History   Socioeconomic History   Marital status: Married    Spouse name: Not on file   Number of children: Not on file   Years of education: Not on file   Highest education level: Not on file  Occupational History   Not on file  Tobacco Use   Smoking status: Never   Smokeless tobacco: Not on file  Substance and Sexual Activity   Alcohol use: No   Drug use: No   Sexual activity: Not on file  Other Topics Concern   Not on file  Social History Narrative   Not on file   Social Determinants of Health   Financial Resource Strain: Not on file  Food Insecurity: Not on file  Transportation Needs: Not on file  Physical Activity: Not on file  Stress: Not on file  Social Connections: Not on file  Intimate Partner Violence: Not on file    Family  History:   The patient's family history includes Hypertension in her mother and another family member; Stroke in her mother.    ROS:  Please see the history of present illness.  All other ROS reviewed and negative.     Physical Exam/Data:   Vitals:   07/30/21 1802 07/30/21 1959 07/30/21 2031 07/30/21 2045  BP: (!) 157/54 (!) 189/56  (!) 165/85  Pulse: (!) 40 (!) 44  (!) 43  Resp: 18 16  20   Temp:   98.6 F (37 C)   TempSrc:   Oral   SpO2: 99% 98%  100%   No intake or output data in the 24 hours ending 07/30/21 2120    09/04/2020   10:38 PM 04/03/2020    5:00 AM 04/01/2020    5:00 AM  Last 3 Weights  Weight (lbs) 123 lb 131 lb 2.8 oz 131 lb 13.4 oz  Weight (kg) 55.792 kg 59.5 kg 59.8 kg     There is no height or weight on file to calculate BMI.  General:  Well nourished, well developed, in no acute distress HEENT: normal Neck: no JVD Vascular: No carotid bruits; Distal pulses 2+ bilaterally   Cardiac:  normal S1, S2; RRR; ejection systolic murmur  Lungs:  clear to auscultation bilaterally, no wheezing, rhonchi or rales  Abd: soft, nontender, no hepatomegaly  Ext: no edema Musculoskeletal:  No deformities, BUE and BLE strength normal and equal Skin: warm and dry  Neuro:  CNs 2-12 intact, no focal abnormalities noted Psych:  Normal affect     Relevant CV Studies: none  Laboratory Data:  High Sensitivity Troponin:  No results for input(s): "TROPONINIHS" in the last 720 hours.    Chemistry Recent Labs  Lab 07/30/21 1653  NA 136  K 4.1  CL 103  CO2 23  GLUCOSE 110*  BUN 16  CREATININE 0.70  CALCIUM 10.2  MG 2.1  GFRNONAA >60  ANIONGAP 10    No results for input(s): "PROT", "ALBUMIN", "AST", "ALT", "ALKPHOS", "BILITOT" in the last 168 hours. Lipids No results for input(s): "CHOL", "TRIG", "HDL", "LABVLDL", "LDLCALC", "CHOLHDL" in the last 168 hours. Hematology Recent Labs  Lab 07/30/21 1653  WBC 8.3  RBC 4.31  HGB 13.3  HCT 39.8  MCV 92.3  MCH 30.9   MCHC 33.4  RDW 14.0  PLT 255   Thyroid No results for input(s): "TSH", "FREET4" in the last 168 hours. BNPNo results for input(s): "BNP", "PROBNP" in the last 168 hours.  DDimer No results for input(s): "DDIMER" in the last 168 hours.   Radiology/Studies:  DG Chest Portable 1 View  Result Date: 07/30/2021 CLINICAL DATA:  Weakness. EXAM: PORTABLE CHEST 1 VIEW COMPARISON:  10/29/2020 FINDINGS: Stable cardiomediastinal contours. No pleural effusion or edema identified. No airspace opacities. The visualized osseous structures are unremarkable. IMPRESSION: No acute cardiopulmonary abnormalities. Electronically Signed   By: Signa Kell M.D.   On: 07/30/2021 21:12     Assessment and Plan:   2:1 AV block, symptomatic Chronic comorbidities; HTN, HLD, Asthma, GERD, Anxiety,    Plan:  - NPO after MN for PPM (Dual chamber) - resumed home medications - BP is high which is probably reactive and expected - patient denies any chest pain. Continous telemetry - full code. Risk Assessment/Risk Scores:           Severity of Illness: The appropriate patient status for this patient is OBSERVATION. Observation status is judged to be reasonable and necessary in order to provide the required intensity of service to ensure the patient's safety. The patient's presenting symptoms, physical exam findings, and initial radiographic and laboratory data in the context of their medical condition is felt to place them at decreased risk for further clinical deterioration. Furthermore, it is anticipated that the patient will be medically stable for discharge from the hospital within 2 midnights of admission.    For questions or updates, please contact [facility] Please consult [email or web address] for contact info under     Signed, Elmon Kirschner, MD  07/30/2021 9:20 PM

## 2021-07-30 NOTE — ED Provider Triage Note (Addendum)
Emergency Medicine Provider Triage Evaluation Note  [name] , a 76 y.o. female  was evaluated in triage.  Pt complains of pacemaker needed. States she just had a heart monitor. She was sent for pacemaker placement due to bradycardia.  It sounds like her cardiologist, who is at [facility], may have talk to somebody here.  Patient went to admitting, but was sent to the emergency department.  She reports having fatigue over the past several weeks.  No lightheadedness or syncope.  No chest pain or shortness of breath.  I paged and spoke with [name], cardmaster. She does not know of patient. She gave me pager number for EP, called, no callback x2.    Review of Systems  Positive: fatigue Negative: SOB, syncope  Physical Exam  BP (!) 211/59   Pulse (!) 43   Temp 98.2 F (36.8 C) (Oral)   Resp 16   SpO2 100%  Gen:   Awake, no distress   Resp:  Normal effort  MSK:   Moves extremities without difficulty  Other:  Bradycardia, 40's  Medical Decision Making  Medically screening exam initiated at 4:36 PM.  Appropriate orders placed.  [name] was informed that the remainder of the evaluation will be completed by another provider, this initial triage assessment does not replace that evaluation, and the importance of remaining in the ED until their evaluation is complete.      Donna Crigler, PA-C 07/30/21 1646

## 2021-07-31 ENCOUNTER — Encounter (HOSPITAL_COMMUNITY): Payer: Self-pay | Admitting: Cardiology

## 2021-07-31 ENCOUNTER — Other Ambulatory Visit: Payer: Self-pay

## 2021-07-31 ENCOUNTER — Inpatient Hospital Stay (HOSPITAL_COMMUNITY): Payer: Medicare HMO

## 2021-07-31 ENCOUNTER — Encounter (HOSPITAL_COMMUNITY)
Admission: EM | Disposition: A | Discharge status: Home / Self Care | Payer: Self-pay | Source: Home / Self Care | Attending: Cardiology

## 2021-07-31 DIAGNOSIS — I4430 Unspecified atrioventricular block: Secondary | ICD-10-CM | POA: Diagnosis not present

## 2021-07-31 DIAGNOSIS — I441 Atrioventricular block, second degree: Secondary | ICD-10-CM

## 2021-07-31 HISTORY — PX: PACEMAKER IMPLANT: EP1218

## 2021-07-31 LAB — BASIC METABOLIC PANEL
Anion gap: 8 (ref 5–15)
BUN: 16 mg/dL (ref 8–23)
CO2: 25 mmol/L (ref 22–32)
Calcium: 9.6 mg/dL (ref 8.9–10.3)
Chloride: 105 mmol/L (ref 98–111)
Creatinine, Ser: 0.94 mg/dL (ref 0.44–1.00)
GFR, Estimated: >60 mL/min (ref >60)
Glucose, Bld: 96 mg/dL (ref 70–99)
Potassium: 3.9 mmol/L (ref 3.5–5.1)
Sodium: 138 mmol/L (ref 135–145)

## 2021-07-31 LAB — CBC WITH DIFFERENTIAL/PLATELET
Abs Immature Granulocytes: 0.02 10*3/uL (ref 0.00–0.07)
Basophils Absolute: 0.1 10*3/uL (ref 0.0–0.1)
Basophils Relative: 1 %
Eosinophils Absolute: 0.2 10*3/uL (ref 0.0–0.5)
Eosinophils Relative: 3 %
HCT: 33.4 % — ABNORMAL LOW (ref 36.0–46.0)
Hemoglobin: 11.6 g/dL — ABNORMAL LOW (ref 12.0–15.0)
Immature Granulocytes: 0 %
Lymphocytes Relative: 28 %
Lymphs Abs: 2 10*3/uL (ref 0.7–4.0)
MCH: 31.1 pg (ref 26.0–34.0)
MCHC: 34.7 g/dL (ref 30.0–36.0)
MCV: 89.5 fL (ref 80.0–100.0)
Monocytes Absolute: 0.7 10*3/uL (ref 0.1–1.0)
Monocytes Relative: 10 %
Neutro Abs: 4 10*3/uL (ref 1.7–7.7)
Neutrophils Relative %: 58 %
Platelets: 249 10*3/uL (ref 150–400)
RBC: 3.73 MIL/uL — ABNORMAL LOW (ref 3.87–5.11)
RDW: 14.1 % (ref 11.5–15.5)
WBC: 7 10*3/uL (ref 4.0–10.5)
nRBC: 0 % (ref 0.0–0.2)

## 2021-07-31 LAB — ECHOCARDIOGRAM COMPLETE
AR max vel: 2 cm2
AV Peak grad: 24.7 mmHg
Ao pk vel: 2.49 m/s
Area-P 1/2: 3.27 cm2
Height: 64 in
S' Lateral: 2.4 cm
Weight: 2052.92 oz

## 2021-07-31 LAB — SURGICAL PCR SCREEN
MRSA, PCR: NEGATIVE
Staphylococcus aureus: NEGATIVE

## 2021-07-31 SURGERY — PACEMAKER IMPLANT

## 2021-07-31 MED ORDER — HYDRALAZINE HCL 20 MG/ML IJ SOLN: 5.0000 mg | Freq: Four times a day (QID) | INTRAMUSCULAR | Status: DC | PRN

## 2021-07-31 MED ORDER — SODIUM CHLORIDE 0.9% FLUSH: 3.0000 mL | INTRAVENOUS | Status: DC | PRN

## 2021-07-31 MED ORDER — MIDAZOLAM HCL 5 MG/5ML IJ SOLN
INTRAMUSCULAR | Status: AC
  Filled 2021-07-31: qty 5

## 2021-07-31 MED ORDER — HEPARIN (PORCINE) IN NACL 1000-0.9 UT/500ML-% IV SOLN
INTRAVENOUS | Status: AC
  Filled 2021-07-31: qty 500

## 2021-07-31 MED ORDER — CEFAZOLIN SODIUM-DEXTROSE 2-4 GM/100ML-% IV SOLN
INTRAVENOUS | Status: AC
  Filled 2021-07-31: qty 100

## 2021-07-31 MED ORDER — SODIUM CHLORIDE 0.9 % IV SOLN
INTRAVENOUS | Status: AC
  Filled 2021-07-31: qty 2

## 2021-07-31 MED ORDER — FENTANYL CITRATE (PF) 100 MCG/2ML IJ SOLN
INTRAMUSCULAR | Status: DC | PRN
  Administered 2021-07-31: 25 ug via INTRAVENOUS

## 2021-07-31 MED ORDER — SODIUM CHLORIDE 0.9 % IV SOLN: 250.0000 mL | INTRAVENOUS | Status: DC

## 2021-07-31 MED ORDER — MIDAZOLAM HCL 5 MG/5ML IJ SOLN
INTRAMUSCULAR | Status: DC | PRN
  Administered 2021-07-31: 1 mg via INTRAVENOUS

## 2021-07-31 MED ORDER — ONDANSETRON HCL 4 MG/2ML IJ SOLN: 4.0000 mg | Freq: Four times a day (QID) | INTRAMUSCULAR | Status: DC | PRN

## 2021-07-31 MED ORDER — SODIUM CHLORIDE 0.9% FLUSH
3.0000 mL | Freq: Two times a day (BID) | INTRAVENOUS | Status: DC
  Administered 2021-07-31 (×2): 3 mL via INTRAVENOUS

## 2021-07-31 MED ORDER — SODIUM CHLORIDE 0.9 % IV SOLN
INTRAVENOUS | Status: AC | PRN
  Administered 2021-07-31: 75 mL via INTRAVENOUS

## 2021-07-31 MED ORDER — FENTANYL CITRATE (PF) 100 MCG/2ML IJ SOLN
INTRAMUSCULAR | Status: AC
  Filled 2021-07-31: qty 2

## 2021-07-31 MED ORDER — HEPARIN (PORCINE) IN NACL 1000-0.9 UT/500ML-% IV SOLN
INTRAVENOUS | Status: DC | PRN
  Administered 2021-07-31: 500 mL

## 2021-07-31 MED ORDER — SODIUM CHLORIDE 0.9 % IV SOLN: INTRAVENOUS | Status: DC

## 2021-07-31 MED ORDER — LIDOCAINE HCL (PF) 1 % IJ SOLN
INTRAMUSCULAR | Status: AC
  Filled 2021-07-31: qty 30

## 2021-07-31 MED ORDER — LIDOCAINE HCL (PF) 1 % IJ SOLN
INTRAMUSCULAR | Status: DC | PRN
  Administered 2021-07-31: 60 mL

## 2021-07-31 MED ORDER — CHLORHEXIDINE GLUCONATE 4 % EX LIQD
60.0000 mL | Freq: Once | CUTANEOUS | Status: AC
  Administered 2021-07-31: 4 via TOPICAL
  Filled 2021-07-31 (×2): qty 60

## 2021-07-31 MED ORDER — ATROPINE SULFATE 1 MG/10ML IJ SOSY
PREFILLED_SYRINGE | INTRAMUSCULAR | Status: AC
  Filled 2021-07-31: qty 10

## 2021-07-31 MED ORDER — CEFAZOLIN SODIUM-DEXTROSE 1-4 GM/50ML-% IV SOLN
1.0000 g | Freq: Four times a day (QID) | INTRAVENOUS | Status: AC
  Administered 2021-07-31 – 2021-08-01 (×3): 1 g via INTRAVENOUS
  Filled 2021-07-31 (×3): qty 50

## 2021-07-31 MED ORDER — SODIUM CHLORIDE 0.9 % IV SOLN
80.0000 mg | INTRAVENOUS | Status: AC
  Administered 2021-07-31: 80 mg
  Filled 2021-07-31: qty 2

## 2021-07-31 MED ORDER — CEFAZOLIN SODIUM-DEXTROSE 2-4 GM/100ML-% IV SOLN
2.0000 g | INTRAVENOUS | Status: AC
  Administered 2021-07-31: 2 g via INTRAVENOUS
  Filled 2021-07-31: qty 100

## 2021-07-31 MED ORDER — CHLORHEXIDINE GLUCONATE 4 % EX LIQD
60.0000 mL | Freq: Once | CUTANEOUS | Status: AC
  Administered 2021-07-31: 4 via TOPICAL
  Filled 2021-07-31: qty 60

## 2021-07-31 SURGICAL SUPPLY — 14 items
CABLE SURGICAL S-101-97-12 (CABLE) ×2 IMPLANT
CATH CPS LOCATOR 3D MED (CATHETERS) ×1 IMPLANT
HELIX LOCKING TOOL (MISCELLANEOUS) ×2
LEAD TENDRIL MRI 52CM LPA1200M (Lead) ×1 IMPLANT
LEAD TENDRIL SDX 2088TC-58CM (Lead) ×1 IMPLANT
PACEMAKER ASSURITY DR-RF (Pacemaker) ×1 IMPLANT
PAD DEFIB RADIO PHYSIO CONN (PAD) ×2 IMPLANT
SHEATH 8FR PRELUDE SNAP 13 (SHEATH) ×1 IMPLANT
SHEATH CLASSIC 9.5F (SHEATH) ×1 IMPLANT
SHEATH PROBE COVER 6X72 (BAG) ×1 IMPLANT
SLITTER AGILIS HISPRO (INSTRUMENTS) ×1 IMPLANT
TOOL HELIX LOCKING (MISCELLANEOUS) IMPLANT
TRAY PACEMAKER INSERTION (PACKS) ×2 IMPLANT
WIRE HI TORQ VERSACORE-J 145CM (WIRE) ×1 IMPLANT

## 2021-08-01 ENCOUNTER — Inpatient Hospital Stay (HOSPITAL_COMMUNITY): Payer: Medicare HMO

## 2021-08-01 ENCOUNTER — Encounter (HOSPITAL_COMMUNITY): Payer: Self-pay | Admitting: Cardiology

## 2021-08-01 DIAGNOSIS — Z950 Presence of cardiac pacemaker: Secondary | ICD-10-CM

## 2021-08-01 DIAGNOSIS — I441 Atrioventricular block, second degree: Secondary | ICD-10-CM | POA: Diagnosis not present

## 2021-08-01 DIAGNOSIS — R001 Bradycardia, unspecified: Secondary | ICD-10-CM

## 2021-08-01 DIAGNOSIS — I10 Essential (primary) hypertension: Secondary | ICD-10-CM | POA: Diagnosis present

## 2021-08-01 HISTORY — DX: Presence of cardiac pacemaker: Z95.0

## 2021-08-01 HISTORY — DX: Bradycardia, unspecified: R00.1

## 2021-08-01 HISTORY — DX: Essential (primary) hypertension: I10

## 2021-08-01 LAB — BASIC METABOLIC PANEL
Anion gap: 10 (ref 5–15)
BUN: 16 mg/dL (ref 8–23)
CO2: 22 mmol/L (ref 22–32)
Calcium: 9.4 mg/dL (ref 8.9–10.3)
Chloride: 105 mmol/L (ref 98–111)
Creatinine, Ser: 0.75 mg/dL (ref 0.44–1.00)
GFR, Estimated: >60 mL/min (ref >60)
Glucose, Bld: 92 mg/dL (ref 70–99)
Potassium: 3.3 mmol/L — ABNORMAL LOW (ref 3.5–5.1)
Sodium: 137 mmol/L (ref 135–145)

## 2021-08-01 LAB — CBC WITH DIFFERENTIAL/PLATELET
Abs Immature Granulocytes: 0.02 10*3/uL (ref 0.00–0.07)
Basophils Absolute: 0.1 10*3/uL (ref 0.0–0.1)
Basophils Relative: 1 %
Eosinophils Absolute: 0.3 10*3/uL (ref 0.0–0.5)
Eosinophils Relative: 3 %
HCT: 36.5 % (ref 36.0–46.0)
Hemoglobin: 12.2 g/dL (ref 12.0–15.0)
Immature Granulocytes: 0 %
Lymphocytes Relative: 22 %
Lymphs Abs: 1.9 10*3/uL (ref 0.7–4.0)
MCH: 30.1 pg (ref 26.0–34.0)
MCHC: 33.4 g/dL (ref 30.0–36.0)
MCV: 90.1 fL (ref 80.0–100.0)
Monocytes Absolute: 0.8 10*3/uL (ref 0.1–1.0)
Monocytes Relative: 10 %
Neutro Abs: 5.2 10*3/uL (ref 1.7–7.7)
Neutrophils Relative %: 64 %
Platelets: 234 10*3/uL (ref 150–400)
RBC: 4.05 MIL/uL (ref 3.87–5.11)
RDW: 14 % (ref 11.5–15.5)
WBC: 8.3 10*3/uL (ref 4.0–10.5)
nRBC: 0 % (ref 0.0–0.2)

## 2021-08-01 MED ORDER — POTASSIUM CHLORIDE CRYS ER 20 MEQ PO TBCR
40.0000 meq | EXTENDED_RELEASE_TABLET | Freq: Once | ORAL | Status: AC
  Administered 2021-08-01: 40 meq via ORAL
  Filled 2021-08-01: qty 2

## 2021-08-01 NOTE — Progress Notes (Signed)
PIV removed. AVS reviewed. Patient verbalizes understanding of necessary wound care, follow up appointments and medication regimen.

## 2021-08-01 NOTE — Discharge Summary (Addendum)
Discharge Summary    Patient ID: [name] MRN: [id number]; DOB: Aug 16, 1945  Admit date: 07/30/2021 Discharge date: 08/01/2021  PCP:  Knox Royalty, MD   [facility] Providers Cardiologist:  Little Ishikawa, MD  Electrophysiologist:  Regan Lemming, MD       Discharge Diagnoses    Principal Problem:   AV block Active Problems:   Status post placement of cardiac pacemaker 07/31/21 St Jude Medical Assurity MRI dual-chamber pacemaker    Symptomatic bradycardia   HTN (hypertension)    Diagnostic Studies/Procedures    07/31/21  Pacemaker implantation  for symptomatic bradycardia and second degree AV block Device Placement:  The leads were then connected to an Abbott Medical Assurity MRI  model [id number] (serial number  [id number] ) pacemaker.  The pocket was irrigated with copious gentamicin solution.  The pacemaker was then placed into the pocket.  The pocket was then closed in 3 layers with 2.0 Vicryl suture for the 3.0 Vicryl suture subcutaneous and subcuticular layers.  Steri-  Strips and a sterile dressing were then applied. EBL<47ml.  There were no early apparent complications.     CONCLUSIONS:   1. Successful implantation of a St Jude Medical Assurity MRI dual-chamber pacemaker for symptomatic bradycardia  2. No early apparent complications.      Echo 07/31/21 IMPRESSIONS     1. There is a dynamic mid LV gradient ( peak gradient of 15 mmHg with  mean gradient of 7 mmHg). Left ventricular ejection fraction, by  estimation, is 60 to 65%. The left ventricle has normal function. The left  ventricle has no regional wall motion  abnormalities. Left ventricular diastolic parameters were normal.   2. Right ventricular systolic function is normal. The right ventricular  size is normal. There is normal pulmonary artery systolic pressure.   3. Left atrial size was mild to moderately dilated.   4. The mitral valve is normal in structure. Mild mitral valve   regurgitation.   5. The aortic valve is calcified. Aortic valve regurgitation is trivial.   FINDINGS   Left Ventricle: There is a dynamic mid LV gradient ( peak gradient of 15  mmHg with mean gradient of 7 mmHg). Left ventricular ejection fraction, by  estimation, is 60 to 65%. The left ventricle has normal function. The left  ventricle has no regional wall   motion abnormalities. The left ventricular internal cavity size was  normal in size. There is no left ventricular hypertrophy. Left ventricular  diastolic parameters were normal.   Right Ventricle: The right ventricular size is normal. Right vetricular  wall thickness was not well visualized. Right ventricular systolic  function is normal. There is normal pulmonary artery systolic pressure.  The tricuspid regurgitant velocity is 2.67  m/s, and with an assumed right atrial pressure of 5 mmHg, the estimated  right ventricular systolic pressure is 33.5 mmHg.   Left Atrium: Left atrial size was mild to moderately dilated.   Right Atrium: Right atrial size was normal in size.   Pericardium: There is no evidence of pericardial effusion.   Mitral Valve: The mitral valve is normal in structure. Mild mitral valve  regurgitation.   Tricuspid Valve: The tricuspid valve is normal in structure. Tricuspid  valve regurgitation is mild.   Aortic Valve: The aortic valve is calcified. Aortic valve regurgitation is  trivial. Aortic valve peak gradient measures 24.7 mmHg.   Pulmonic Valve: The pulmonic valve was normal in structure. Pulmonic valve  regurgitation is mild.   Aorta:  The aortic root and ascending aorta are structurally normal, with  no evidence of dilitation.   IAS/Shunts: The interatrial septum was not well visualized.  _____________   History of Present Illness     [name] is a 76 y.o. female with  a hx of HTN (difficult to control by history), asthma/allergies, glaucoma, HLD, IBS, GERD and admitted 07/30/21  with extreme fatigue, unable to do her regular exercise, and dizziness.  No syncope.  She was seen by her PCP and HR in 40s.  She was found to be in SR with 2:1AV block. No chest pain or SOB.  No Prior arrhythmias or bradycardia.  On no AV nodal blocking agents.  EKG 2:1 AVBlock 40bpm, RBBB (appears new) Significant artifact, 2:1 AVblock, 43bpm, RBBB 2:1 AVBlock 34bpm, RBBB.  Tele with mostly 2.1 AV block some 1:1 conduction and some 3:1 conduction as well as CHB V rates 30s-40s.  She was admitted and EP consult obtained  TSH 1.512, Hgb 13.3, WBC 8.3 and plts 255, Na 136, K+ 4.1 BUN 16 Cr 0.70   CXR NAD.    Hospital Course     Consultants: Dr. Elberta Fortis,  EP  Pt was seen and evaluated by Dr. Elberta Fortis and with her symptoms and no reason such as meds for 2:1 AV block a PPM was warranted.    07/31/21 she underwent PPM with Abbott Medical Assurity MRI  model [id number] (serial number  [id number] ) pacemaker.  RA and RV leads placed without complications.    Echo with normal EF and mild MR, AR, TR  Today she has been seen and evaluated by Dr. [name] and found stable for discharge. Pacer site without hematoma.  Generator interrogated and device is stable.  Post PPM insertion CXR with no pneumothorax.  Now with SR atrially paced.  K+ today is low, replaced with 40 meq PO KDUR.  Otherwise labs stable.  She will ambulate and if stable discharge home.  Her follow up appts have been made.   Did the patient have an acute coronary syndrome (MI, NSTEMI, STEMI, etc) this admission?:  No                               Did the patient have a percutaneous coronary intervention (stent / angioplasty)?:  No.          _____________  Discharge Vitals Blood pressure 126/84, pulse 69, temperature 98 F (36.7 C), temperature source Oral, resp. rate 18, height 5\' 4"  (1.626 m), weight 58.3 kg, SpO2 95 %.  Filed Weights   07/30/21 2339 08/01/21 0400  Weight: 58.2 kg 58.3 kg  Exam per Dr. [name].   General:Pleasant affect,  NAD Skin:Warm and dry, brisk capillary refill HEENT:normocephalic, sclera clear, mucus membranes moist Neck:supple, no JVD, no bruits  Chest no hematoma at pacer site. Heart:S1S2 RRR without murmur, gallup, rub or click Lungs:clear without rales, rhonchi, or wheezes AOZ:HYQM, non tender, + BS, do not palpate liver spleen or masses Ext:no lower ext edema, 2+ pedal pulses, 2+ radial pulses Neuro:alert and oriented, MAE, follows commands, + facial symmetry  Labs & Radiologic Studies    CBC Recent Labs    07/31/21 0538 08/01/21 0546  WBC 7.0 8.3  NEUTROABS 4.0 5.2  HGB 11.6* 12.2  HCT 33.4* 36.5  MCV 89.5 90.1  PLT 249 234   Basic Metabolic Panel Recent Labs    57/84/69 1653 07/31/21 0538 08/01/21 0546  NA 136  138 137  K 4.1 3.9 3.3*  CL 103 105 105  CO2 23 25 22   GLUCOSE 110* 96 92  BUN 16 16 16   CREATININE 0.70 0.94 0.75  CALCIUM 10.2 9.6 9.4  MG 2.1  --   --    Liver Function Tests No results for input(s): "AST", "ALT", "ALKPHOS", "BILITOT", "PROT", "ALBUMIN" in the last 72 hours. No results for input(s): "LIPASE", "AMYLASE" in the last 72 hours. High Sensitivity Troponin:   No results for input(s): "TROPONINIHS" in the last 720 hours.  BNP Invalid input(s): "POCBNP" D-Dimer No results for input(s): "DDIMER" in the last 72 hours. Hemoglobin A1C No results for input(s): "HGBA1C" in the last 72 hours. Fasting Lipid Panel No results for input(s): "CHOL", "HDL", "LDLCALC", "TRIG", "CHOLHDL", "LDLDIRECT" in the last 72 hours. Thyroid Function Tests Recent Labs    07/30/21 2100  TSH 1.512   _____________  DG Chest 2 View  Result Date: 08/01/2021 CLINICAL DATA:  Post pacemaker insertion 07/30/2021 EXAM: CHEST - 2 VIEW COMPARISON:  AP chest 07/30/2021 FINDINGS: Left chest wall cardiac pacer with leads overlying the right atrium and right ventricle new from prior. Cardiac silhouette is at the upper limits of normal size. Mediastinal contours are within normal  limits with mild calcification within the aortic arch. The lungs are clear. No pleural effusion or pneumothorax. No acute skeletal abnormality. IMPRESSION: New left chest wall cardiac pacer with leads overlying the right atrium and right ventricle. No pneumothorax. Electronically Signed   By: Neita Garnet M.D.   On: 08/01/2021 06:51   EP PPM/ICD IMPLANT  Result Date: 07/31/2021 SURGEON:  Loman Brooklyn, MD   PREPROCEDURE DIAGNOSIS:  second degree AV block   POSTPROCEDURE DIAGNOSIS:  second degree AV block    PROCEDURES:  1. Pacemaker implantation.    INTRODUCTION:  [name] is a 76 y.o. female with a history of bradycardia who presents today for pacemaker implantation.  The patient reports intermittent episodes of dizziness over the past few months.  No reversible causes have been identified.  The patient therefore presents today for pacemaker implantation.   DESCRIPTION OF PROCEDURE:  Informed written consent was obtained, and  the patient was brought to the electrophysiology lab in a fasting state.  The patient required no sedation for the procedure today.  The patients left chest was prepped and draped in the usual sterile fashion by the EP lab staff. The skin overlying the left deltopectoral region was infiltrated with lidocaine for local analgesia.  A 4-cm incision was made over the left deltopectoral region.  A left subcutaneous pacemaker pocket was fashioned using a combination of sharp and blunt dissection. Electrocautery was required to assure hemostasis.  Left Upper Extremity Venography: A venogram of the left upper extremity was performed, which revealed a large left cephalic vein, which emptied into a large left subclavian vein.  The left axillary vein was moderate in size.  RA/RV Lead Placement: The left axillary vein was therefore cannulated.  Through the left axillary vein, a Abbot Medical model Tendril MRI [id number] (serial number  [id number]) right atrial lead and an Abbott Medical model  Tendril MRI [id number] (serial number  [id number]) right ventricular lead were advanced with fluoroscopic visualization into the right atrial appendage and right ventricular apex positions respectively.  Initial atrial lead P- waves measured 4 mV with impedance of 607 ohms and a threshold of 0.7 V at 0.5 msec.  Right ventricular lead R-waves measured 13.5 mV with an impedance of 719 ohms and  a threshold of 1.1 V at 0.5 msec.  Both leads were secured to the pectoralis fascia using #2-0 silk over the suture sleeves. Device Placement:  The leads were then connected to an Abbott Medical Assurity MRI  model [id number] (serial number  [id number] ) pacemaker.  The pocket was irrigated with copious gentamicin solution.  The pacemaker was then placed into the pocket.  The pocket was then closed in 3 layers with 2.0 Vicryl suture for the 3.0 Vicryl suture subcutaneous and subcuticular layers.  Steri-  Strips and a sterile dressing were then applied. EBL<10ml.  There were no early apparent complications.   CONCLUSIONS:  1. Successful implantation of a St Jude Medical Assurity MRI dual-chamber pacemaker for symptomatic bradycardia  2. No early apparent complications.       Will Elberta Fortis, MD 07/31/2021 6:09 PM   ECHOCARDIOGRAM COMPLETE  Result Date: 07/31/2021    ECHOCARDIOGRAM REPORT   Patient Name:   [name] Date of Exam: 07/31/2021 Medical Rec #:  [id number]        Height:       64.0 in Accession #:    [id number]       Weight:       128.3 lb Date of Birth:  Jul 06, 1945         BSA:          1.620 m Patient Age:    75 years         BP:           126/35 mmHg Patient Gender: F                HR:           35 bpm. Exam Location:  Inpatient Procedure: 2D Echo, Cardiac Doppler and Color Doppler Indications:    Heart Block  History:        Patient has no prior history of Echocardiogram examinations.  Sonographer:    Eduard Roux Referring Phys: [id number] ROBIN FERNANDES IMPRESSIONS  1. There is a dynamic mid LV gradient ( peak gradient  of 15 mmHg with mean gradient of 7 mmHg). Left ventricular ejection fraction, by estimation, is 60 to 65%. The left ventricle has normal function. The left ventricle has no regional wall motion abnormalities. Left ventricular diastolic parameters were normal.  2. Right ventricular systolic function is normal. The right ventricular size is normal. There is normal pulmonary artery systolic pressure.  3. Left atrial size was mild to moderately dilated.  4. The mitral valve is normal in structure. Mild mitral valve regurgitation.  5. The aortic valve is calcified. Aortic valve regurgitation is trivial. FINDINGS  Left Ventricle: There is a dynamic mid LV gradient ( peak gradient of 15 mmHg with mean gradient of 7 mmHg). Left ventricular ejection fraction, by estimation, is 60 to 65%. The left ventricle has normal function. The left ventricle has no regional wall  motion abnormalities. The left ventricular internal cavity size was normal in size. There is no left ventricular hypertrophy. Left ventricular diastolic parameters were normal. Right Ventricle: The right ventricular size is normal. Right vetricular wall thickness was not well visualized. Right ventricular systolic function is normal. There is normal pulmonary artery systolic pressure. The tricuspid regurgitant velocity is 2.67 m/s, and with an assumed right atrial pressure of 5 mmHg, the estimated right ventricular systolic pressure is 33.5 mmHg. Left Atrium: Left atrial size was mild to moderately dilated. Right Atrium: Right atrial size was normal in size. Pericardium: There is  no evidence of pericardial effusion. Mitral Valve: The mitral valve is normal in structure. Mild mitral valve regurgitation. Tricuspid Valve: The tricuspid valve is normal in structure. Tricuspid valve regurgitation is mild. Aortic Valve: The aortic valve is calcified. Aortic valve regurgitation is trivial. Aortic valve peak gradient measures 24.7 mmHg. Pulmonic Valve: The pulmonic  valve was normal in structure. Pulmonic valve regurgitation is mild. Aorta: The aortic root and ascending aorta are structurally normal, with no evidence of dilitation. IAS/Shunts: The interatrial septum was not well visualized.  LEFT VENTRICLE PLAX 2D LVIDd:         4.40 cm LVIDs:         2.40 cm LV PW:         1.10 cm LV IVS:        1.00 cm LVOT diam:     1.80 cm LV SV:         119 LV SV Index:   74 LVOT Area:     2.54 cm  RIGHT VENTRICLE             IVC RV Basal diam:  3.60 cm     IVC diam: 1.00 cm RV Mid diam:    3.10 cm RV S prime:     12.70 cm/s TAPSE (M-mode): 2.2 cm LEFT ATRIUM             Index        RIGHT ATRIUM           Index LA diam:        3.30 cm 2.04 cm/m   RA Area:     15.30 cm LA Vol (A2C):   72.0 ml 44.44 ml/m  RA Volume:   36.50 ml  22.53 ml/m LA Vol (A4C):   56.9 ml 35.12 ml/m LA Biplane Vol: 65.5 ml 40.43 ml/m  AORTIC VALVE                  PULMONIC VALVE AV Area (Vmax): 2.00 cm      PV Vmax:       1.04 m/s AV Vmax:        248.50 cm/s   PV Peak grad:  4.3 mmHg AV Peak Grad:   24.7 mmHg LVOT Vmax:      195.00 cm/s LVOT Vmean:     114.000 cm/s LVOT VTI:       0.468 m  AORTA Ao Root diam: 2.90 cm Ao Asc diam:  3.40 cm MITRAL VALVE               TRICUSPID VALVE MV Area (PHT): 3.27 cm    TR Peak grad:   28.5 mmHg MV Decel Time: 232 msec    TR Vmax:        267.00 cm/s MV E velocity: 91.20 cm/s MV A velocity: 99.80 cm/s  SHUNTS MV E/A ratio:  0.91        Systemic VTI:  0.47 m                            Systemic Diam: 1.80 cm Kristeen Miss MD Electronically signed by Kristeen Miss MD Signature Date/Time: 07/31/2021/11:06:50 AM    Final    DG Chest Portable 1 View  Result Date: 07/30/2021 CLINICAL DATA:  Weakness. EXAM: PORTABLE CHEST 1 VIEW COMPARISON:  10/29/2020 FINDINGS: Stable cardiomediastinal contours. No pleural effusion or edema identified. No airspace opacities. The visualized osseous structures are unremarkable. IMPRESSION: No acute cardiopulmonary  abnormalities. Electronically  Signed   By: Signa Kell M.D.   On: 07/30/2021 21:12    Disposition   Pt is being discharged home today in good condition.  Follow-up Plans & Appointments     Follow-up Information     [facility] [address] Office Follow up on 08/12/2021.   Specialty: Cardiology Why: at 9:20 AM in device clinic to evaluate incision.  please arrive 15 min early if possible. Contact information: [address], [address] [place] [address] [phone]        Regan Lemming, MD Follow up on 11/03/2021.   Specialty: Cardiology Why: at 3:00 PM with Dr. Elberta Fortis to eval pacemaker. Contact information: [address] [place] [address] [phone]         Little Ishikawa, MD Follow up.   Specialties: Cardiology, Radiology Why: the office will call with date and time.  this is your general cardiologist. Contact information: [address] [place] [address] [phone]                  Discharge Medications   Allergies as of 08/01/2021       Reactions   Azithromycin    Other reaction(s): gi distress, GI intolerance, Other   Levofloxacin Hives, Rash   Sulfa Antibiotics Hives   Red patches on legs years ago  Red patches on legs years ago         Medication List     STOP taking these medications    BIOTIN PO   budesonide 0.5 MG/2ML nebulizer solution Commonly known as: PULMICORT   cholecalciferol 1000 units tablet Commonly known as: VITAMIN D   cloNIDine 0.2 MG tablet Commonly known as: CATAPRES   dextromethorphan 30 MG/5ML liquid Commonly known as: DELSYM   dextromethorphan-guaiFENesin 30-600 MG 12hr tablet Commonly known as: MUCINEX DM   FLUoxetine 10 MG capsule Commonly known as: PROZAC   hydrALAZINE 25 MG tablet Commonly known as: APRESOLINE   loratadine 10 MG tablet Commonly known as: CLARITIN   mupirocin ointment 2 % Commonly known as: BACTROBAN   traZODone 50 MG  tablet Commonly known as: DESYREL   VITAMIN K PO       TAKE these medications    acetaminophen 650 MG CR tablet Commonly known as: TYLENOL Take 650 mg by mouth every 8 (eight) hours as needed for pain.   albuterol 108 (90 Base) MCG/ACT inhaler Commonly known as: VENTOLIN HFA Inhale 2 puffs into the lungs every 6 (six) hours as needed for wheezing or shortness of breath.   alendronate 70 MG tablet Commonly known as: FOSAMAX Take 70 mg by mouth every Friday.   Alphagan P 0.1 % Soln Generic drug: brimonidine Place 1 drop into both eyes 2 (two) times daily.   amLODipine 10 MG tablet Commonly known as: NORVASC Take 10 mg by mouth daily. What changed: Another medication with the same name was removed. Continue taking this medication, and follow the directions you see here.   Azelastine HCl 137 MCG/SPRAY Soln Place 2 sprays into both nostrils 2 (two) times daily.   budesonide-formoterol 160-4.5 MCG/ACT inhaler Commonly known as: SYMBICORT Inhale 2 puffs into the lungs 2 (two) times daily.   calcitRIOL 0.5 MCG capsule Commonly known as: ROCALTROL Take 0.5 mcg by mouth daily.   Cod Liver Oil Caps Take 1 capsule by mouth daily.   desloratadine 5 MG tablet Commonly known as: CLARINEX Take 5 mg by mouth daily.  dicyclomine 20 MG tablet Commonly known as: BENTYL Take 1 tablet (20 mg total) by mouth in the morning, at noon, and at bedtime.   fluticasone 50 MCG/ACT nasal spray Commonly known as: FLONASE Place 1 spray into both nostrils 2 (two) times daily.   GARLIC PO Take 1 tablet by mouth every other day.   hydrocortisone 2.5 % cream Apply 1 Application topically 2 (two) times daily as needed (itching).   latanoprost 0.005 % ophthalmic solution Commonly known as: XALATAN Place 1 drop into both eyes at bedtime.   montelukast 10 MG tablet Commonly known as: SINGULAIR Take 10 mg by mouth daily.   multivitamin tablet Take 1 tablet by mouth daily.    pantoprazole 40 MG tablet Commonly known as: PROTONIX Take 40 mg by mouth daily. What changed: Another medication with the same name was removed. Continue taking this medication, and follow the directions you see here.   prednisoLONE acetate 1 % ophthalmic suspension Commonly known as: PRED FORTE Place 1 drop into both eyes 3 (three) times daily.   PRESCRIPTION MEDICATION Take 5 tablets by mouth daily as needed (to prevent thrush). Clotriamazole troche 10mg  Tablet   valsartan 160 MG tablet Commonly known as: DIOVAN Take 160 mg by mouth 2 (two) times daily.   vitamin B-12 1000 MCG tablet Commonly known as: CYANOCOBALAMIN Take 1,000 mcg by mouth daily.   vitamin C 500 MG tablet Commonly known as: ASCORBIC ACID Take 500 mg by mouth daily.   Vitamin D (Ergocalciferol) 1.25 MG (50000 UNIT) Caps capsule Commonly known as: DRISDOL Take 50,000 Units by mouth every Wednesday.   Zinc 50 MG Caps Take 1 capsule by mouth daily.           Outstanding Labs/Studies   none  Duration of Discharge Encounter   Greater than 30 minutes including physician time.  Signed, Nada Boozer, NP 08/01/2021, 9:02 AM  EP attending  Patient seen and examined.  Agree with the findings as noted above.  The patient had presented with 2-1 heart block.  She is status post insertion of a [employer] Jude dual-chamber pacemaker.  She is feeling well with minimal incisional soreness.  Pacemaker interrogation under my direction demonstrates normal dual-chamber pacemaker function.  Chest x-ray is without acute abnormality.  Pacing leads are in place.  She will be discharged home.  Usual follow-up in our device clinic in approximately 2 weeks.  She will follow-up with Dr. Elberta Fortis in 3 months.  Activity restrictions have been discussed with the patient.  Questions have been answered.  Sharrell Ku, MD

## 2021-08-10 IMAGING — CT CT HEAD W/O CM
3 of 4 series · 14 of 47 positions shown, 16 images · non-contrast
Comparison: None.

CLINICAL DATA: Seizure

EXAM:
CT HEAD WITHOUT CONTRAST
TECHNIQUE: Contiguous axial images were obtained from the base of the skull
through the vertex without intravenous contrast.

[Series 5: coronal soft tissue · coronal · 0.32mm/px · 3 of 70 slices shown]
[im 24/70  brain]
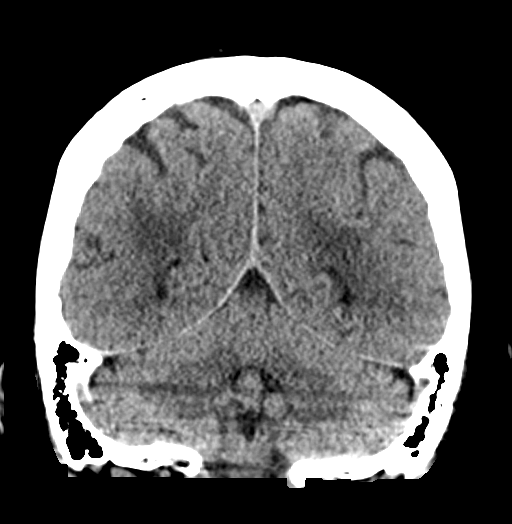
[im 31/70  brain]
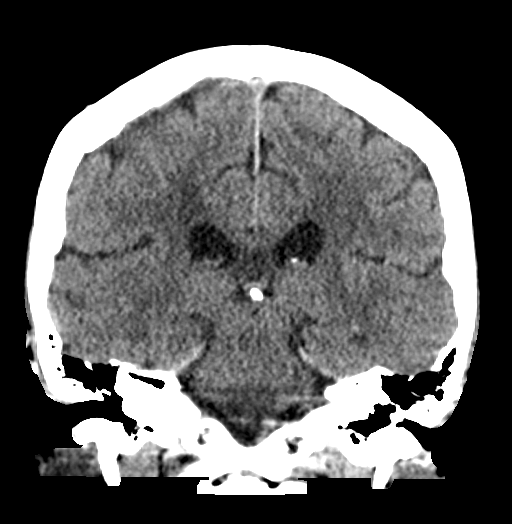
[im 39/70  brain]
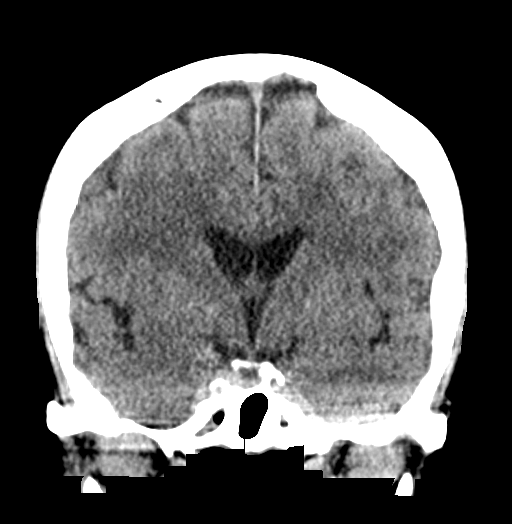

[Series 6: sagittal soft tissue · sagittal · 0.32mm/px · 3 of 55 slices shown]
[im 19/55  brain]
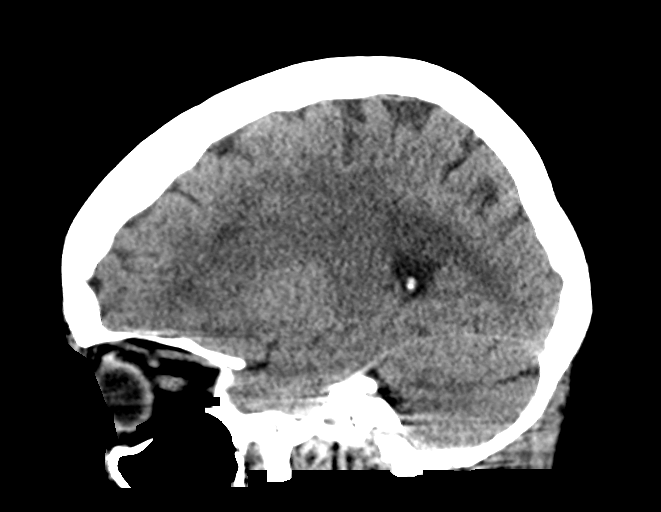
[im 28/55  brain]
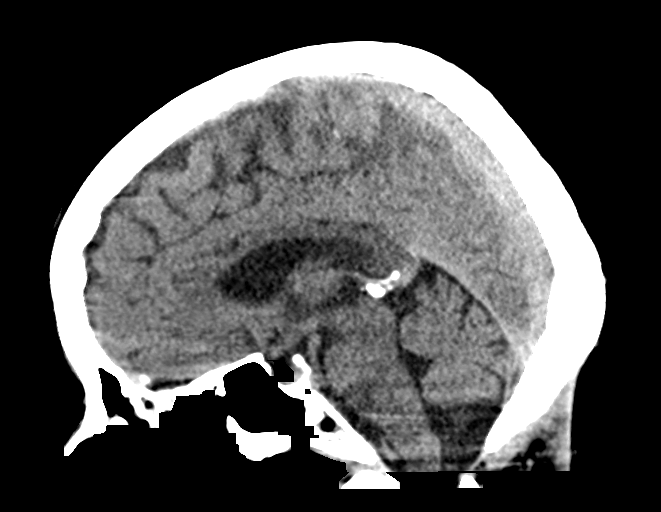
[im 37/55  brain]
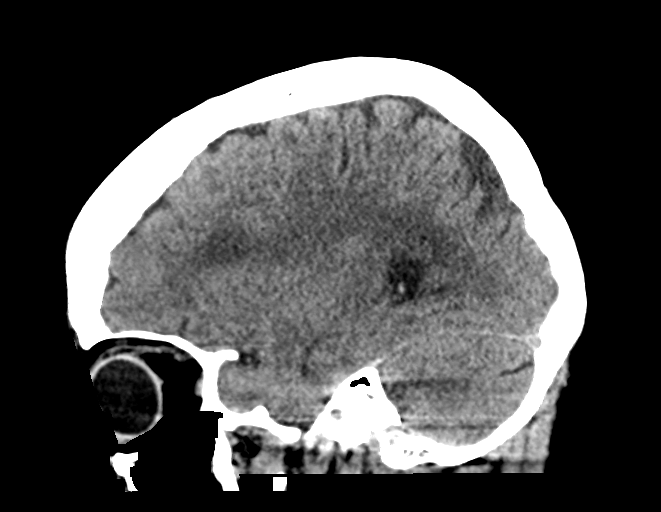

[Series 7: true axial · axial · 0.32mm/px · z∈[-155,-32]mm · 8 of 54 slices shown, 10 images]
[im 6/54  brain]
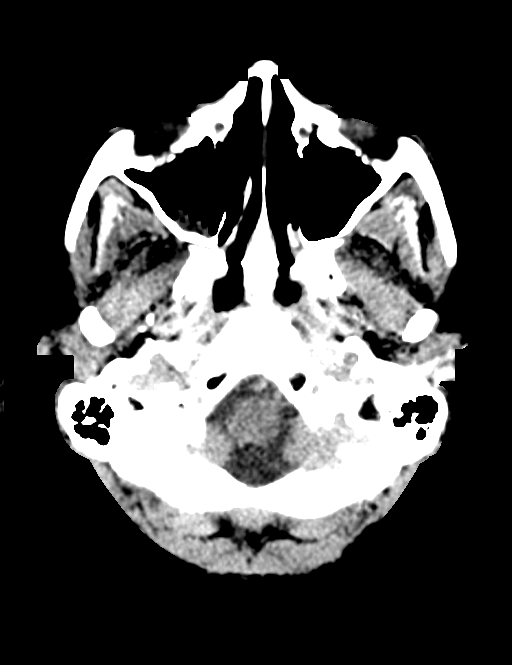
[im 6/54  bone]
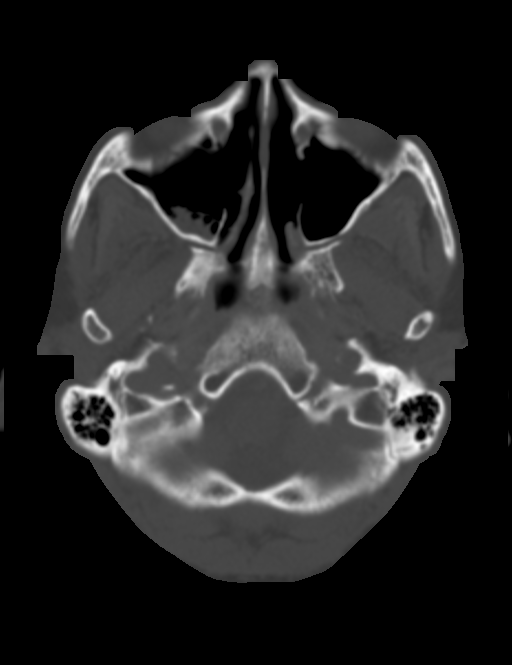
[im 12/54  brain]
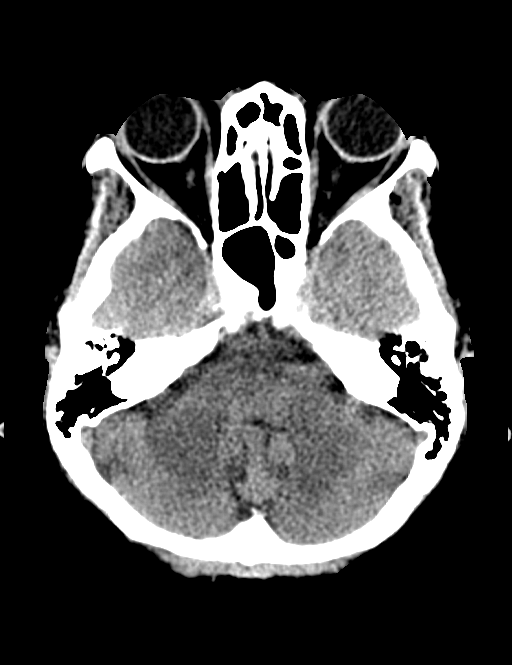
[im 18/54  brain]
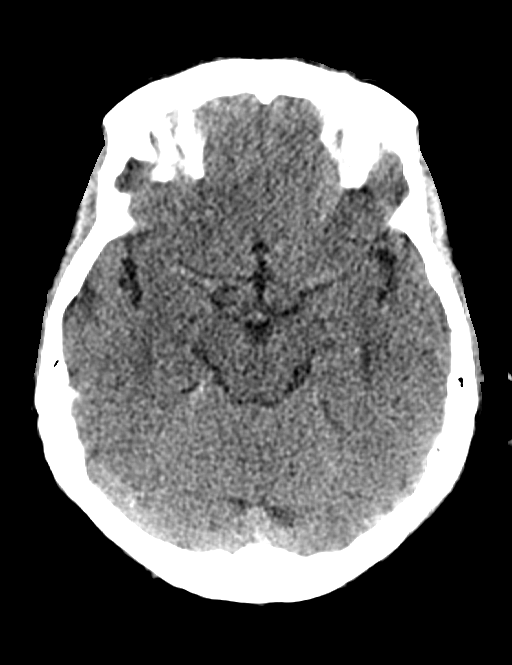
[im 24/54  brain]
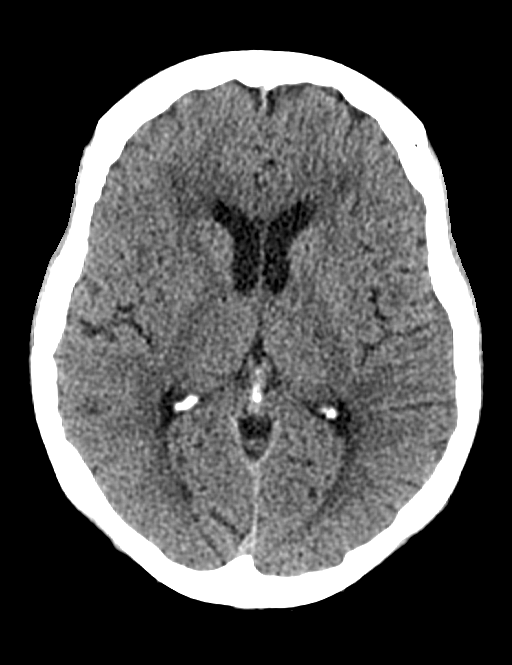
[im 30/54  brain]
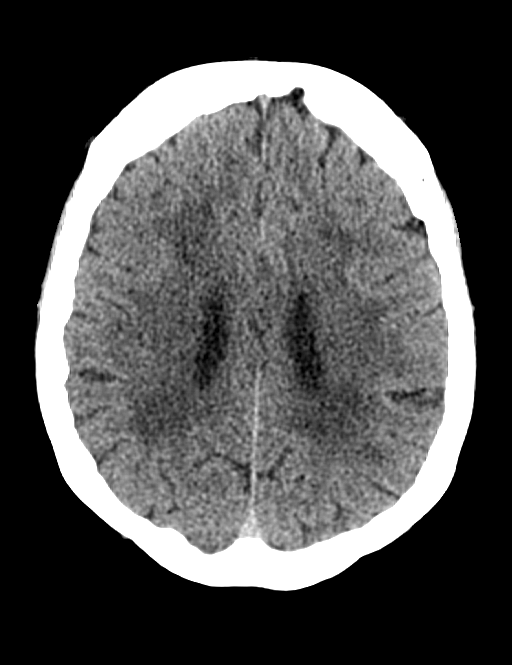
[im 30/54  bone]
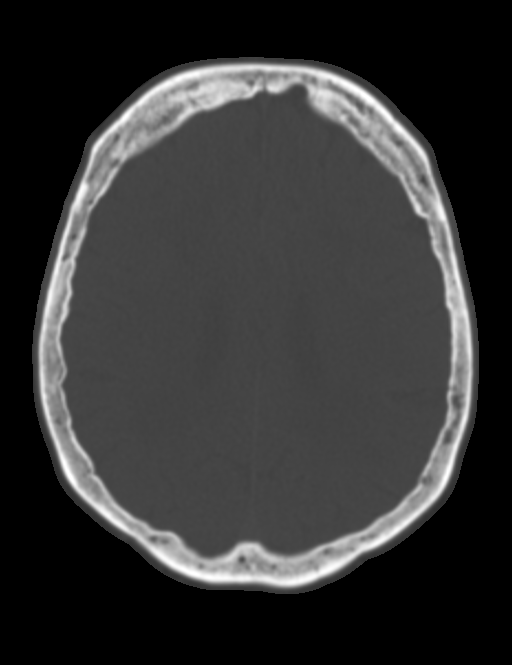
[im 36/54  brain]
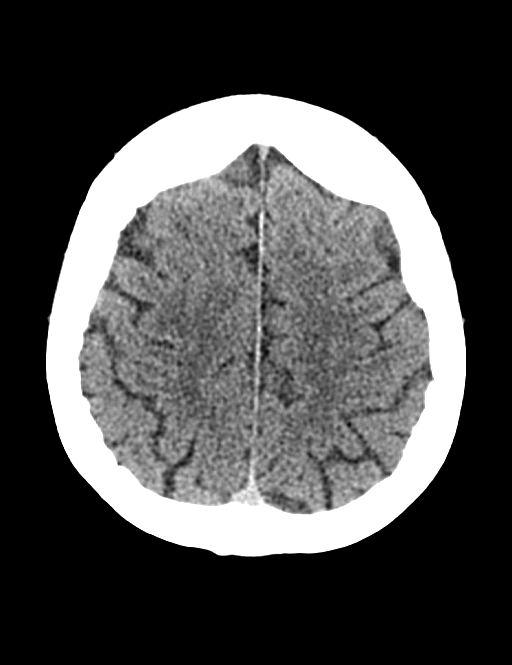
[im 42/54  brain]
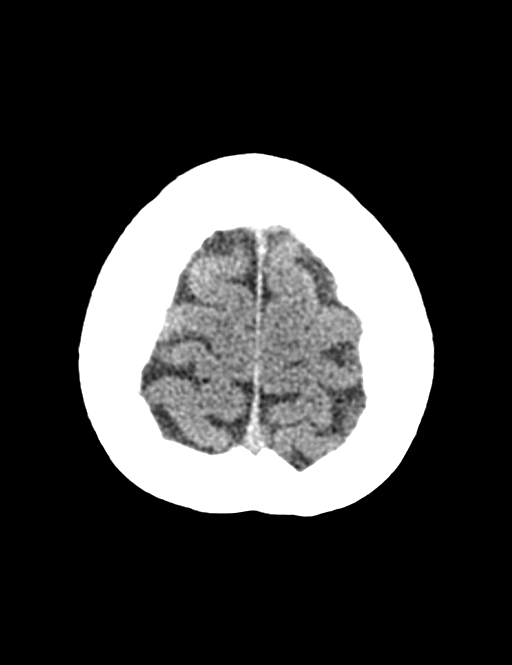
[im 48/54  brain]
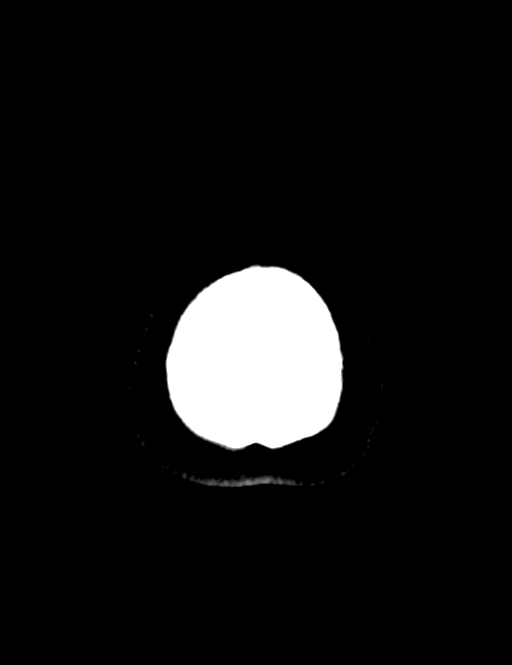

[14 of 47 positions shown; findings below may reference images not displayed]

FINDINGS: Brain: There is no mass, hemorrhage or extra-axial collection. The
size and configuration of the ventricles and extra-axial CSF spaces
are normal. There is hypoattenuation of the white matter, most
commonly indicating chronic small vessel disease.

Vascular: No abnormal hyperdensity of the major intracranial
arteries or dural venous sinuses. No intracranial atherosclerosis.

Skull: The visualized skull base, calvarium and extracranial soft
tissues are normal.

Sinuses/Orbits: Frothy secretions in the right maxillary sinus. The
orbits are normal.
IMPRESSION: 1. Chronic small vessel disease without acute intracranial
abnormality.
2. Frothy secretions in the right maxillary sinus. Correlate for
symptoms of acute sinusitis.

## 2021-08-12 ENCOUNTER — Ambulatory Visit (INDEPENDENT_AMBULATORY_CARE_PROVIDER_SITE_OTHER): Payer: Medicare HMO

## 2021-08-12 DIAGNOSIS — I4430 Unspecified atrioventricular block: Secondary | ICD-10-CM | POA: Diagnosis not present

## 2021-08-12 NOTE — Patient Instructions (Signed)
   After Your Pacemaker   Monitor your pacemaker site for redness, swelling, and drainage. Call the device clinic at [phone] if you experience these symptoms or fever/chills.  Your incision was closed with Steri-strips or Latouche:  You may shower 7 days after your procedure and wash your incision with soap and water. Avoid lotions, ointments, or perfumes over your incision until it is well-healed.  You may use a hot tub or a pool after your wound check appointment if the incision is completely closed.  Do not lift, push or pull greater than 10 pounds with the affected arm until 6 weeks after your procedure. There are no other restrictions in arm movement after your wound check appointment. Until August 4th.   You may drive, unless driving has been restricted by your healthcare providers.   Remote monitoring is used to monitor your pacemaker from home. This monitoring is scheduled every 91 days by our office. It allows [place] to keep an eye on the functioning of your device to ensure it is working properly. You will routinely see your Electrophysiologist annually (more often if necessary).

## 2021-08-13 IMAGING — DX DG CHEST 1V PORT
1 series · 1 of 1 positions shown · non-contrast
Comparison: 03/28/2020.

CLINICAL DATA: Pleuritic pain.

EXAM:
PORTABLE CHEST 1 VIEW

[chest ap]
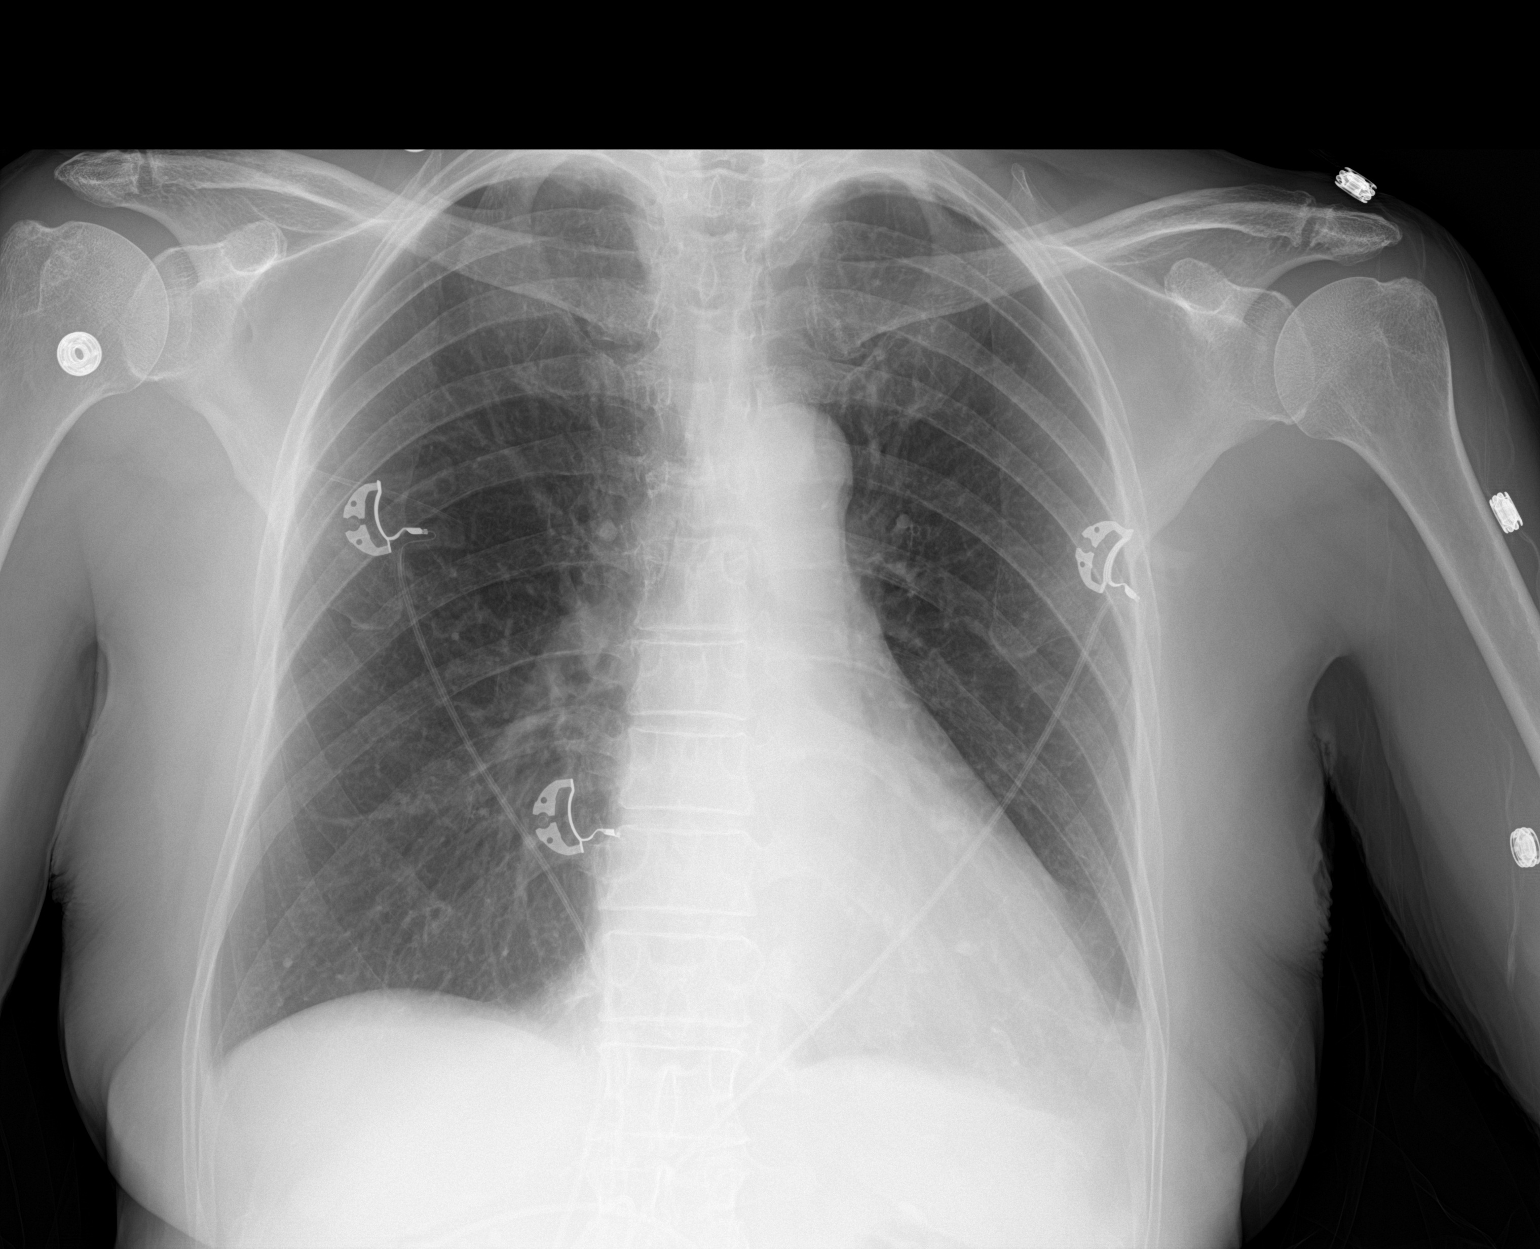

[1 of 1 positions shown; findings below may reference images not displayed]

FINDINGS: Mediastinum hilar structures normal. Heart size stable. No pulmonary
venous congestion. Mild left base atelectasis/infiltrate and tiny
left pleural effusion. No pneumothorax.
IMPRESSION: Mild left base atelectasis/infiltrate and tiny left pleural
effusion.

## 2021-08-14 IMAGING — CT CT ANGIO CHEST
2 of 7 series · 18 of 46 positions shown · IV contrast (OMNIPAQUE)
Comparison: Chest radiograph April 01, 2020

CLINICAL DATA: Chest pain

EXAM:
CT ANGIOGRAPHY CHEST WITH CONTRAST
TECHNIQUE: Multidetector CT imaging of the chest was performed using the
standard protocol during bolus administration of intravenous
contrast. Multiplanar CT image reconstructions and MIPs were
obtained to evaluate the vascular anatomy.
CONTRAST:  56mL OMNIPAQUE IOHEXOL 350 MG/ML SOLN

[Series 5: thins · axial · 0.64mm/px · z∈[-277,+2]mm · 16 of 317 slices shown]
[im 19/317  lung]
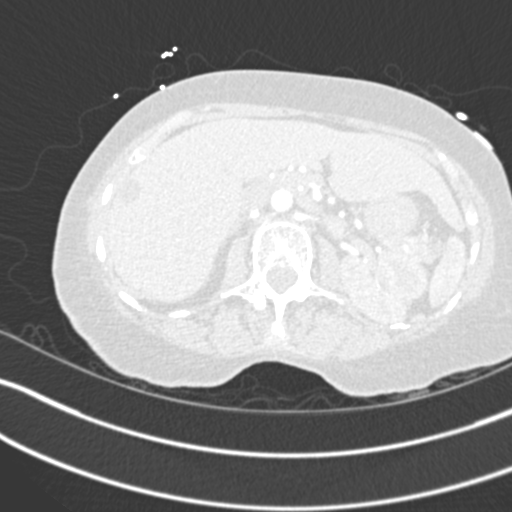
[im 38/317  soft-tissue]
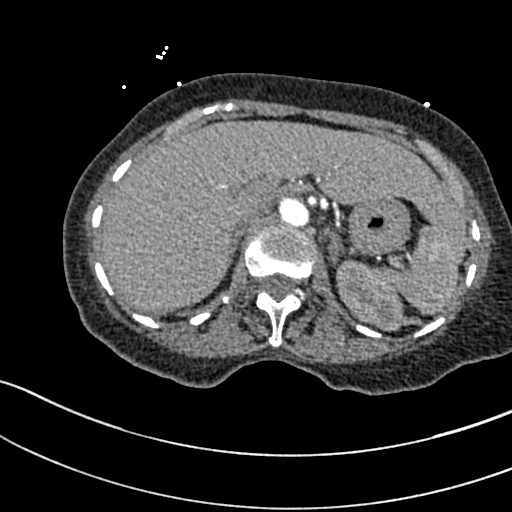
[im 56/317  lung]
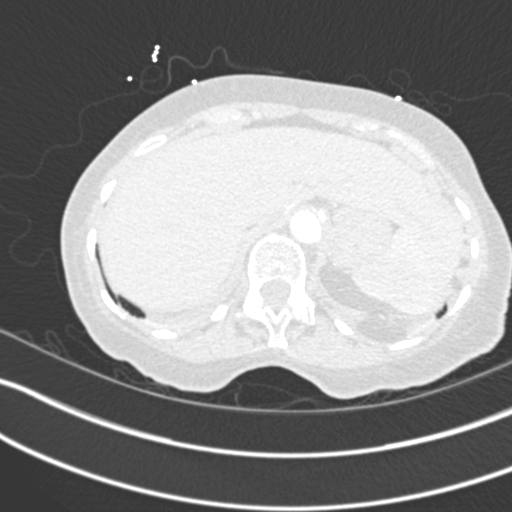
[im 75/317  soft-tissue]
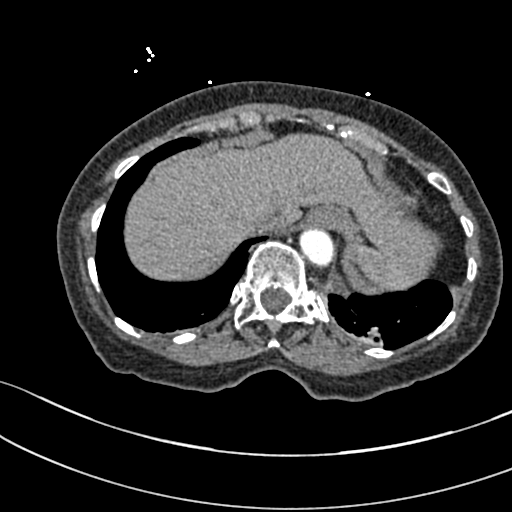
[im 93/317  lung]
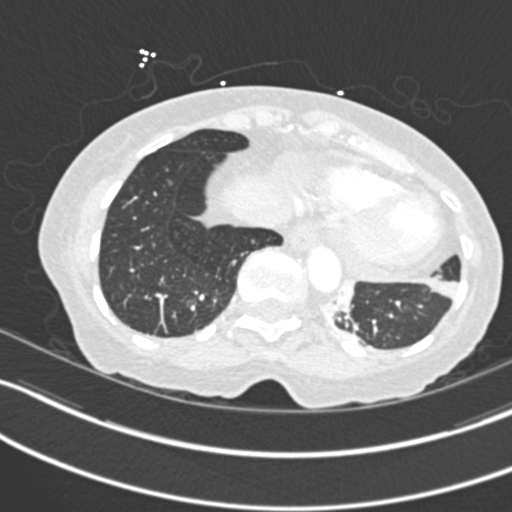
[im 112/317  soft-tissue]
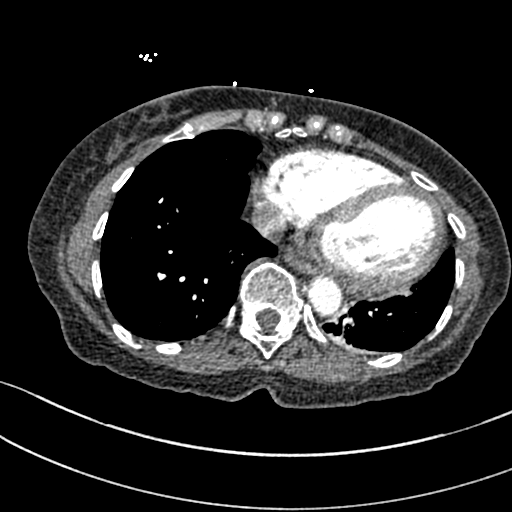
[im 131/317  lung]
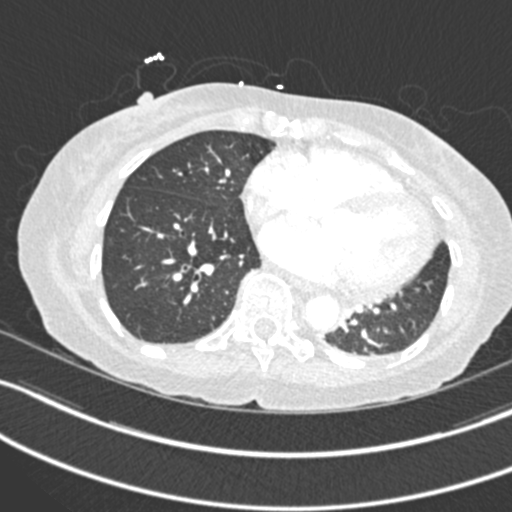
[im 149/317  soft-tissue]
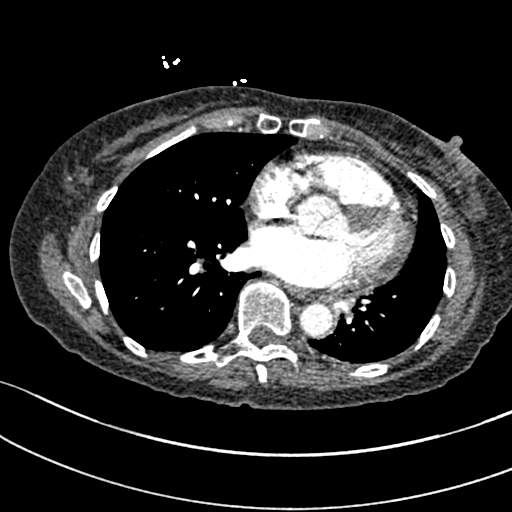
[im 168/317  lung]
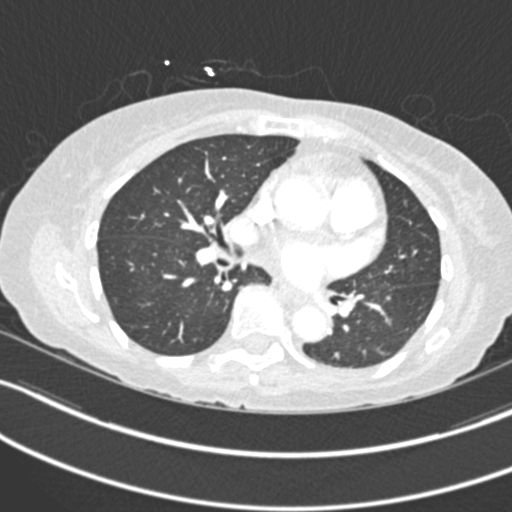
[im 186/317  soft-tissue]
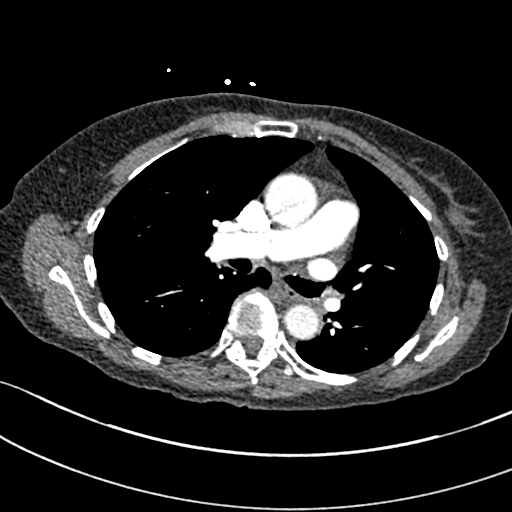
[im 205/317  lung]
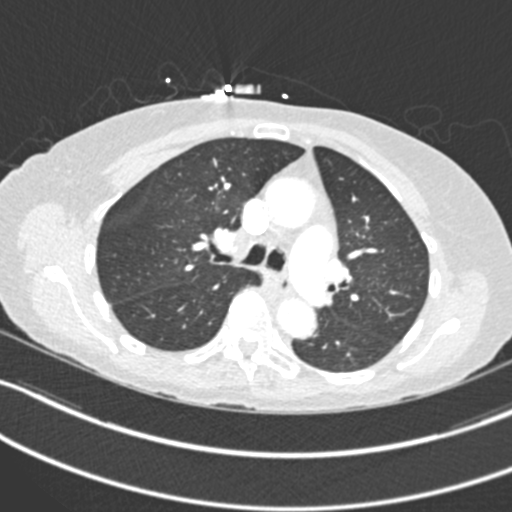
[im 224/317  soft-tissue]
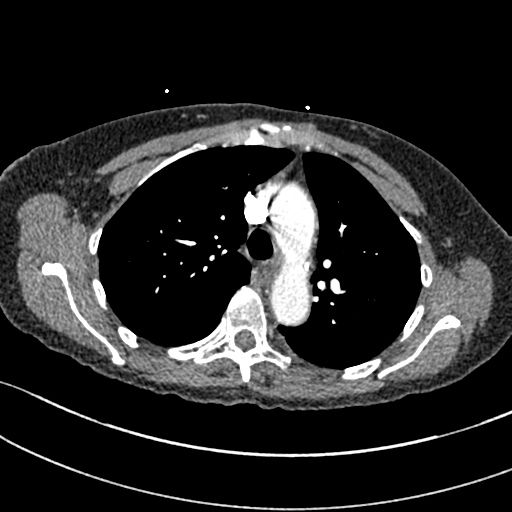
[im 242/317  lung]
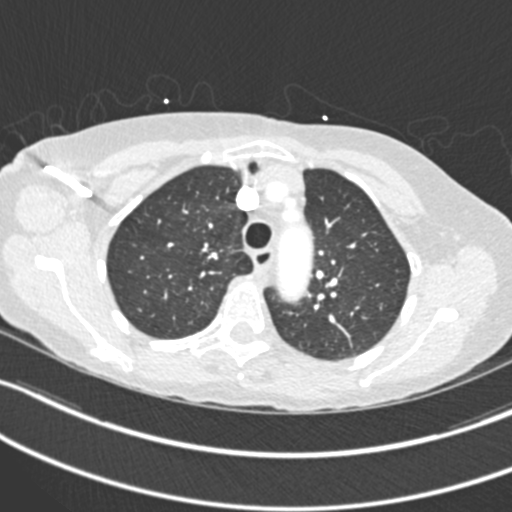
[im 261/317  soft-tissue]
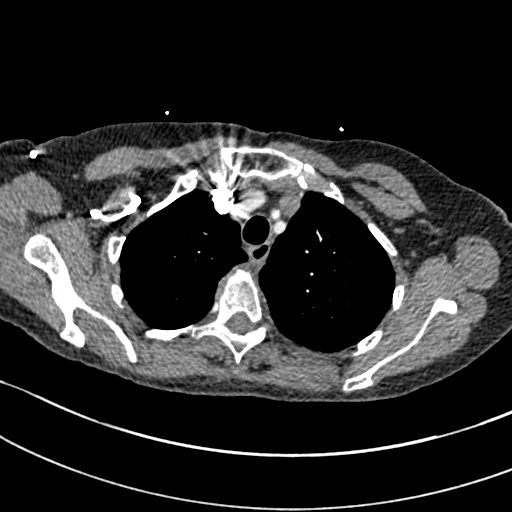
[im 279/317  lung]
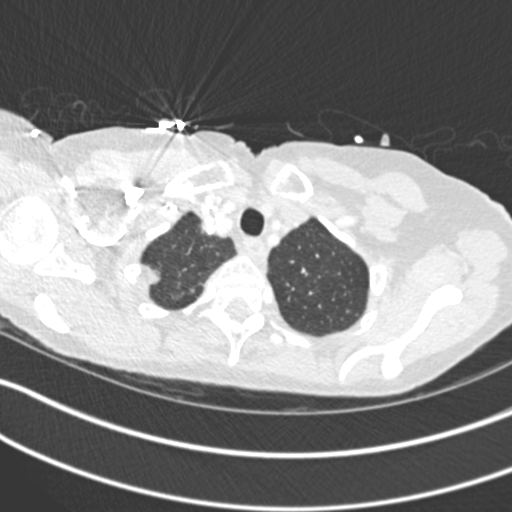
[im 298/317  soft-tissue]
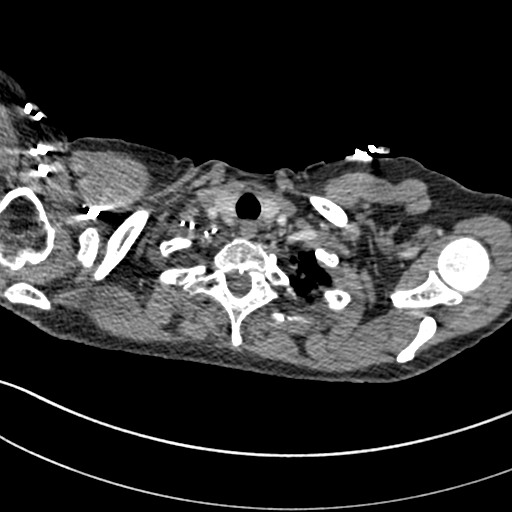

[Series 6: coronal mpr · coronal · 0.72mm/px · 2 of 63 slices shown]
[im 21/63  soft-tissue]
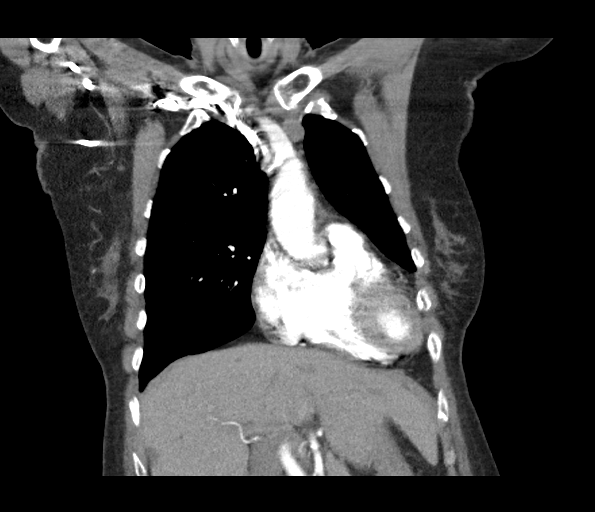
[im 42/63  soft-tissue]
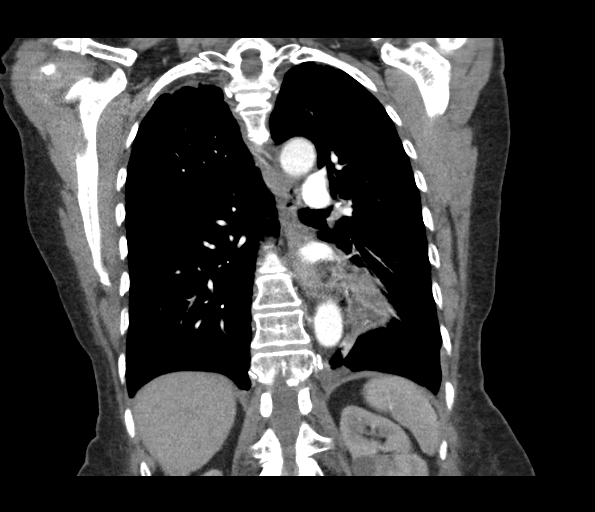

[18 of 46 positions shown; findings below may reference images not displayed]

FINDINGS: Cardiovascular: There is no demonstrable pulmonary embolus. There is
no thoracic aortic aneurysm or dissection. The visualized great
vessels appear normal. There is mild aortic atherosclerosis. There
is no appreciable pericardial effusion or pericardial thickening.

Mediastinum/Nodes: Visualized thyroid appears unremarkable. There is
no evident thoracic adenopathy. No esophageal lesions are
appreciable.

Lungs/Pleura: There is atelectatic change with mild associated
consolidation in the posterior left base. There is atelectatic
change in the posterior right base. There is a small focus of
consolidation in the inferior most aspect of the lingula.

There is scarring in the apices, more on the right than on the left.
There is a focal nodular opacity in the anterior segment of the
right upper lobe measuring 5 mm. No similar nodular opacities
evident elsewhere. No appreciable pleural effusions. No evident
pneumothorax. Trachea and major bronchial structures appear patent.

Upper Abdomen: There is a cyst in the periphery of the right lobe of
the liver laterally measuring 2.2 x 1.6 cm. There is incomplete
visualization of a cyst in the medial mid left kidney measuring
x 1.2 cm. Visualized upper abdominal structures appear unremarkable.

Musculoskeletal: No blastic or lytic bone lesions are evident. Focus
of calcification in the posterior longitudinal ligament noted in
midthoracic region causing mild impression on the canal in this
area. No chest wall lesions.

Review of the MIP images confirms the above findings.
IMPRESSION: 1. No evident pulmonary embolus. No thoracic aortic aneurysm or
dissection. There is mild aortic atherosclerosis.

2. Atelectatic change in the lung bases. Suspect small focus of
pneumonia in the inferior lingula and in theposterior segment of the
left lower lobe.

3. 5 mm nodular opacity in the anterior segment of the right upper
lobe. No follow-up needed if patient is low-risk. Non-contrast chest
CT can be considered in 12 months if patient is high-risk. This
recommendation follows the consensus statement: Guidelines for
Management of Incidental Pulmonary Nodules Detected on CT Images:

4.  No evident adenopathy.

Aortic Atherosclerosis ([id number]-[id number].U).

## 2021-08-20 LAB — CUP PACEART INCLINIC DEVICE CHECK
Battery Remaining Longevity: 118 mo
Battery Voltage: 3.13 V
Brady Statistic RA Percent Paced: 26 %
Brady Statistic RV Percent Paced: 97 %
Date Time Interrogation Session: 20230705091300
Implantable Lead Implant Date: 20230623
Implantable Lead Implant Date: 20230623
Implantable Lead Location: 753859
Implantable Lead Location: 753860
Implantable Pulse Generator Implant Date: 20230623
Lead Channel Impedance Value: 450 Ohm
Lead Channel Impedance Value: 450 Ohm
Lead Channel Pacing Threshold Amplitude: 0.5 V
Lead Channel Pacing Threshold Amplitude: 0.5 V
Lead Channel Pacing Threshold Amplitude: 1.5 V
Lead Channel Pacing Threshold Amplitude: 1.5 V
Lead Channel Pacing Threshold Pulse Width: 0.5 ms
Lead Channel Pacing Threshold Pulse Width: 0.5 ms
Lead Channel Pacing Threshold Pulse Width: 0.5 ms
Lead Channel Pacing Threshold Pulse Width: 0.5 ms
Lead Channel Sensing Intrinsic Amplitude: 12 mV
Lead Channel Sensing Intrinsic Amplitude: 4.9 mV
Lead Channel Setting Pacing Amplitude: 1.375
Lead Channel Setting Pacing Amplitude: 1.75 V
Lead Channel Setting Pacing Pulse Width: 0.5 ms
Lead Channel Setting Sensing Sensitivity: 2 mV
Pulse Gen Model: 2272
Pulse Gen Serial Number: 8092211

## 2021-08-20 NOTE — Progress Notes (Signed)
Wound check appointment. Steri-strips removed. Wound without redness or edema. Incision edges approximated, wound well healed. Normal device function. Thresholds, sensing, and impedances consistent with implant measurements. Device programmed at 3.5V/auto capture programmed on for extra safety margin until 3 month visit. Histogram distribution appropriate for patient and level of activity. 9 AMS <1% AT/AF all episodes <30seconds. No high ventricular rates noted. Patient educated about wound care, arm mobility, lifting restrictions. ROV in 3 months with implanting physician.

## 2021-08-21 ENCOUNTER — Encounter: Payer: Self-pay | Admitting: Physician Assistant

## 2021-08-21 ENCOUNTER — Ambulatory Visit: Payer: Medicare HMO | Admitting: Physician Assistant

## 2021-08-21 VITALS — BP 142/72 | HR 79 | Ht 64.5 in | Wt 133.4 lb

## 2021-08-21 DIAGNOSIS — I441 Atrioventricular block, second degree: Secondary | ICD-10-CM | POA: Diagnosis not present

## 2021-08-21 NOTE — Patient Instructions (Signed)
Medication Instructions:  Your physician recommends that you continue on your current medications as directed. Please refer to the Current Medication list given to you today.  *If you need a refill on your cardiac medications before your next appointment, please call your pharmacy*   Lab Work: NONE If you have labs (blood work) drawn today and your tests are completely normal, you will receive your results only by: MyChart Message (if you have MyChart) OR A paper copy in the mail If you have any lab test that is abnormal or we need to change your treatment, we will call you to review the results.   Testing/Procedures: NONE   Follow-Up: At [facility], you and your health needs are our priority.  As part of our continuing mission to provide you with exceptional heart care, we have created designated Provider Care Teams.  These Care Teams include your primary Cardiologist (physician) and Advanced Practice Providers (APPs -  Physician Assistants and Nurse Practitioners) who all work together to provide you with the care you need, when you need it.  We recommend signing up for the patient portal called "MyChart".  Sign up information is provided on this After Visit Summary.  MyChart is used to connect with patients for Virtual Visits (Telemedicine).  Patients are able to view lab/test results, encounter notes, upcoming appointments, etc.  Non-urgent messages can be sent to your provider as well.   To learn more about what you can do with MyChart, go to [email or web address].    Other Instructions Keep follow up appointment with [facility]   Important Information About Sugar

## 2021-08-21 NOTE — Progress Notes (Unsigned)
Cardiology Office Note:    Date:  08/23/2021   ID:  [name], DOB 13-Jul-1945, MRN [id number]  PCP:  Knox Royalty, MD   [facility] Providers Cardiologist:  Little Ishikawa, MD Electrophysiologist:  Will Jorja Loa, MD     Referring MD: Knox Royalty, MD   Chief Complaint  Patient presents with   Follow-up    Seen for Dr. Bjorn Pippin    History of Present Illness:    [name] is a 76 y.o. 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  She reported significant amount of stress since her husband passed away.  She has relocated from [place] to here and has been noticing extreme fatigue.  She went to [place] [facility] and saw her new PCP when noted she was in 2-1 heart block.  She was not on any AV nodal blocking agent.  Heart rate was 45 bpm.  Patient underwent [employer] MRI dual-chamber pacemaker by Dr. Elberta Fortis on 07/31/2021.  Echocardiogram obtained during this admission on 07/31/2021 showed EF 60 to 65%, no regional wall motion abnormality, mild MR.  Potassium was low so he was given 40 mg K-Dur.  Patient presents today for follow-up.  She denies any significant issue.  He says her pacemaker implantation, she is feeling a lot better without any weakness, dizziness or fatigue.  She denies any chest pain.  Pacemaker site looks clean, dry without any sign of infection.  She recently was seen in the wound clinic.  Device interrogation showed less than 1% AF/AT.  Given the transient nature, will continue on the current therapy.  Overall, she is doing quite well and can follow-up by Dr. 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   albuterol (PROVENTIL HFA;VENTOLIN HFA) 108 (90 BASE) MCG/ACT inhaler Inhale 2 puffs into the lungs every 6 (six) hours as needed for wheezing or shortness of breath.   alendronate (FOSAMAX) 70 MG tablet Take 70 mg by mouth every Friday.   ALPHAGAN P 0.1 % SOLN Place 1 drop into both eyes 2 (two) times daily.   amLODipine (NORVASC) 10 MG tablet Take 10 mg by mouth daily.   Azelastine HCl 137 MCG/SPRAY SOLN Place 2 sprays into both nostrils 2 (two) times daily.   budesonide-formoterol (SYMBICORT) 160-4.5 MCG/ACT inhaler Inhale 2 puffs into the lungs 2 (two) times daily.   calcitRIOL (ROCALTROL) 0.5 MCG capsule Take 0.5 mcg by mouth daily.   cetirizine (CETIRIZINE HCL CHILDRENS) 10 MG chewable tablet    Cod Liver Oil CAPS Take 1 capsule by mouth daily.   desloratadine (CLARINEX) 5 MG tablet Take 5 mg by mouth daily.   dicyclomine (BENTYL) 20 MG  tablet Take 1 tablet (20 mg total) by mouth in the morning, at noon, and at bedtime.   fluticasone (FLONASE) 50 MCG/ACT nasal spray Place 1 spray into both nostrils 2 (two) times daily.   GARLIC PO Take 1 tablet by mouth every other day.   hydrocortisone 2.5 % cream Apply 1 Application topically 2 (two) times daily as needed (itching).   latanoprost (XALATAN) 0.005 % ophthalmic solution Place 1 drop into both eyes at bedtime.   montelukast (SINGULAIR) 10 MG tablet Take 10 mg by mouth daily.   Multiple Vitamin (MULTIVITAMIN) tablet Take 1 tablet by mouth daily.   pantoprazole (PROTONIX) 40 MG tablet Take 40 mg by mouth daily.    prednisoLONE acetate (PRED FORTE) 1 % ophthalmic suspension Place 1 drop into both eyes 3 (three) times daily.   PRESCRIPTION MEDICATION Take 5 tablets by mouth daily as needed (to prevent thrush). Clotriamazole troche 10mg  Tablet   valsartan (DIOVAN) 160 MG tablet Take 160 mg by mouth 2 (two) times daily.   vitamin B-12 (CYANOCOBALAMIN) 1000 MCG tablet Take 1,000 mcg by mouth daily.   vitamin C (ASCORBIC ACID) 500 MG tablet Take 500 mg by mouth daily.   Vitamin D, Ergocalciferol, (DRISDOL) 1.25 MG (50000 UNIT) CAPS capsule Take 50,000 Units by mouth every Wednesday.   Zinc 50 MG CAPS Take 1 capsule by mouth daily.     Allergies:   Azithromycin, Levofloxacin, and Sulfa antibiotics   Social History   Socioeconomic History   Marital status: Widowed    Spouse name: Not on file   Number of children: Not on file   Years of education: Not on file   Highest education level: Not on file  Occupational History   Not on file  Tobacco Use   Smoking status: Never   Smokeless tobacco: Not on file  Vaping Use   Vaping Use: Not on file  Substance and Sexual Activity   Alcohol use: No   Drug use: No   Sexual activity: Not Currently  Other Topics Concern   Not on file  Social History Narrative   Not on file   Social Determinants of Health   Financial Resource Strain: Not on file  Food Insecurity: Not on file  Transportation Needs: Not on file  Physical Activity: Not on file  Stress: Not on file  Social Connections: Not on file     Family History: The patient's family history includes Hypertension in her mother and another family member; Stroke in her mother.  ROS:   Please see the history of present illness.     All other systems reviewed and are negative.  EKGs/Labs/Other Studies Reviewed:    The following studies were reviewed today:  Echo 07/31/2021  1. There is a dynamic mid LV gradient ( peak gradient of 15 mmHg with  mean gradient of 7 mmHg). Left ventricular ejection  fraction, by  estimation, is 60 to 65%. The left ventricle has normal function. The left  ventricle has no regional wall motion  abnormalities. Left ventricular diastolic parameters were normal.   2. Right ventricular systolic function is normal. The right ventricular  size is normal. There is normal pulmonary artery systolic pressure.   3. Left atrial size was mild to moderately dilated.   4. The mitral valve is normal in structure. Mild mitral valve  regurgitation.   5. The aortic valve is calcified. Aortic valve regurgitation is trivial.    EKG:  EKG is not ordered today.    Recent  Labs: 07/30/2021: Magnesium 2.1; TSH 1.512 08/01/2021: BUN 16; Creatinine, Ser 0.75; Hemoglobin 12.2; Platelets 234; Potassium 3.3; Sodium 137  Recent Lipid Panel No results found for: "CHOL", "TRIG", "HDL", "CHOLHDL", "VLDL", "LDLCALC", "LDLDIRECT"   Risk Assessment/Calculations:           Physical Exam:    VS:  BP (!) 142/72   Pulse 79   Ht 5' 4.5" (1.638 m)   Wt 133 lb 6.4 oz (60.5 kg)   SpO2 97%   BMI 22.54 kg/m        Wt Readings from Last 3 Encounters:  08/21/21 133 lb 6.4 oz (60.5 kg)  08/01/21 128 lb 8 oz (58.3 kg)  09/04/20 123 lb (55.8 kg)     GEN:  Well nourished, well developed in no acute distress HEENT: Normal NECK: No JVD; No carotid bruits LYMPHATICS: No lymphadenopathy CARDIAC: RRR, no murmurs, rubs, gallops RESPIRATORY:  Clear to auscultation without rales, wheezing or rhonchi  ABDOMEN: Soft, non-tender, non-distended MUSCULOSKELETAL:  No edema; No deformity  SKIN: Warm and dry NEUROLOGIC:  Alert and oriented x 3 PSYCHIATRIC:  Normal affect   ASSESSMENT:    No diagnosis found. PLAN:    In order of problems listed above:  2:1 AV block: Underwent dual-chamber pacemaker implantation recently.  No longer feels fatigued since pacemaker implantation.  Incision site is clean, dry, without any sign of infection.  Device interrogation during the last wound check  showed 9 AMS, less than 1% AT/AF, all episodes less than 30 seconds.  Arrhythmia seems to be quite transient.  Overall, patient is still feeling quite well           Medication Adjustments/Labs and Tests Ordered: Current medicines are reviewed at length with the patient today.  Concerns regarding medicines are outlined above.  No orders of the defined types were placed in this encounter.  No orders of the defined types were placed in this encounter.   Patient Instructions  Medication Instructions:  Your physician recommends that you continue on your current medications as directed. Please refer to the Current Medication list given to you today.  *If you need a refill on your cardiac medications before your next appointment, please call your pharmacy*   Lab Work: NONE If you have labs (blood work) drawn today and your tests are completely normal, you will receive your results only by: MyChart Message (if you have MyChart) OR A paper copy in the mail If you have any lab test that is abnormal or we need to change your treatment, we will call you to review the results.   Testing/Procedures: NONE   Follow-Up: At [facility], you and your health needs are our priority.  As part of our continuing mission to provide you with exceptional heart care, we have created designated Provider Care Teams.  These Care Teams include your primary Cardiologist (physician) and Advanced Practice Providers (APPs -  Physician Assistants and Nurse Practitioners) who all work together to provide you with the care you need, when you need it.  We recommend signing up for the patient portal called "MyChart".  Sign up information is provided on this After Visit Summary.  MyChart is used to connect with patients for Virtual Visits (Telemedicine).  Patients are able to view lab/test results, encounter notes, upcoming appointments, etc.  Non-urgent messages can be sent to your provider as well.   To learn more  about what you can do with MyChart, go to [email or web address].    Other Instructions Keep follow  up appointment with [facility]   Important Information About Sugar         Ramond Dial, [place]  08/23/2021 11:35 PM    [facility]

## 2021-08-23 ENCOUNTER — Encounter: Payer: Self-pay | Admitting: Physician Assistant

## 2021-08-28 ENCOUNTER — Telehealth: Payer: Self-pay

## 2021-08-28 NOTE — Telephone Encounter (Signed)
Patient walked in office 08/27/21 requesting a letter be faxed to [facility] stating ok for her to receive allergy shots.Letter needs to be faxed @ fax # [phone].Advised I will send message to Azalee Course PA for advice.

## 2021-09-01 NOTE — Telephone Encounter (Deleted)
Patient walked into the office today. She asked that a letter sent/fax to Dr. Darcus Pester at [facility] stating that is is okay to resume allergy shots with her Pacemaker.   Called [place], [place] and he is fine patient having a letter. He asked if I would type and fax it where it needed to go.

## 2021-09-01 NOTE — Telephone Encounter (Signed)
Patient walked into the office again today asking for letter stating she is clear to resume allergy injections.   Called [place], [place] to inform him of this matter. He is okay with a letter being written and asked that I type up letter. Letter was typed and faxed to the requested placed.

## 2021-09-01 NOTE — Telephone Encounter (Signed)
This encounter was created in error - please disregard.

## 2021-10-02 NOTE — Progress Notes (Unsigned)
NEW PATIENT Date of Service/Encounter:  10/05/21 Referring provider: Knox Royalty, MD Primary care provider: Knox Royalty, MD  Subjective:  [name] is a 76 y.o. female with a PMHx of second degree AV block, hypertension, GERD presenting today for evaluation of asthma and allergic rhinitis. History obtained from: chart review and patient.   She moved from [place] about one year ago because she lost her husband due to unexpected loss.  While living in [place] she was followed by both an allergist as well as a [employer] operational officer.  When she moved here she establish care with [facility] allergy group.  Allergist has left that facility, and she would like to transfer care.  We do not have her current records at this time, but we will be submitting a request for records.  Since being off of allergy injections, she is having an increased flare in allergy and dermatitis symptoms.  Allergic rhinitis/conjunctivitis:  Extremely itchy eyes-She is using pataday for the itch.  She started having itchy eyes in March of this year. She went to her PCP and was diagnosed with pink eye and treated as such.  She saw her eye doctor (optometrist) and was told she does not have pink eye. She started doing some drops-pataday and prescription medications for her eyes-on review of her medications, she is using steroid eyedrops.. She does have green mucus coming out of the right side of her nose.  This increased thick drainage has been going on for 3 to 4 weeks at this time.  She is set up an appointment with an ENT physician, but her appointment is in the future.  She has had some maxillary sinus pressure that comes and goes. Using azelastine 2 sprays BID along with Flonase daily.  She feels that azelastine is the most helpful for her symptoms. She is also taking Clarinex prescribed by her previous allergist.  She does not feel this is helpful.  She felt better results with Zyrtec 10 mg nightly. She was on  allergy injections at [facility].  She has a pacemaker over the summer, and her allergy injections were stopped around the time that she was receiving work-up and surgery for her arrhythmia requiring pacemaker.  She has been okayed by her cardiologist to resume allergy injections at this point in time. Many years ago also did a course of AIT while living in [place].  Dermatitis: She developed a red itchy rash on her face that has gotten progressively worse. She was using hydrocortisone and it seemed to make it worse.  Currently only using vaseline on her face. Tomorrow has an appointment with Dermatology.   She has a history of eczema as an adult along with rosacea.  She has had treatment in the past for her eczema on her face including mometasone,elocon, and metronidazole gel.  These were prescribed by her dermatologist in [place], she has not used these medications in many years.  She also has a history of asthma-but she is going to follow-up with a pulmonologist. She was on Symbicort for many years.  For some reason, she was unable to get Symbicort when she arrived in [place].  She has been using her albuterol since getting her pacemaker.  She is using at least twice a week, sometimes daily. Never hospitalized due to asthma.  No OCS or systemic steroids due to her asthma in the past year. She did have a pulmonologist to manage her asthma while living in [place].  She is followed by cardiology for second-degree  AV block.  Recent dual-chamber pacemaker implant.  Last visit on 08/21/2021.  Patient previously on allergy injections at [facility].  She has been cleared by cardiology to resume allergy injections.   Other allergy screening: Food allergy: no Medication allergy:  levofloxacin-hives, sulfas-hives ; azithromycin intolerance-GI symptoms  Past Medical History: Past Medical History:  Diagnosis Date   HTN (hypertension) 08/01/2021   Hypertension    Sinusitis    Status post placement of  cardiac pacemaker 07/31/21 [employer] MRI dual-chamber pacemaker  08/01/2021   Symptomatic bradycardia 08/01/2021   Medication List:  Current Outpatient Medications  Medication Sig Dispense Refill   acetaminophen (TYLENOL) 650 MG CR tablet Take 650 mg by mouth every 8 (eight) hours as needed for pain.     albuterol (PROVENTIL HFA;VENTOLIN HFA) 108 (90 BASE) MCG/ACT inhaler Inhale 2 puffs into the lungs every 6 (six) hours as needed for wheezing or shortness of breath.     alendronate (FOSAMAX) 70 MG tablet Take 70 mg by mouth every Friday.     ALPHAGAN P 0.1 % SOLN Place 1 drop into both eyes 2 (two) times daily.     amLODipine (NORVASC) 10 MG tablet Take 10 mg by mouth daily.     amoxicillin-clavulanate (AUGMENTIN) 875-125 MG tablet Take 1 tablet by mouth 2 (two) times daily for 10 days. 20 tablet 0   Azelastine HCl 137 MCG/SPRAY SOLN Place 2 sprays into both nostrils 2 (two) times daily.     calcitRIOL (ROCALTROL) 0.5 MCG capsule Take 0.5 mcg by mouth daily.     cetirizine (ZYRTEC ALLERGY) 10 MG tablet Take 1 tablet (10 mg total) by mouth daily. 30 tablet 5   Cod Liver Oil CAPS Take 1 capsule by mouth daily.     dicyclomine (BENTYL) 20 MG tablet Take 1 tablet (20 mg total) by mouth in the morning, at noon, and at bedtime. 90 tablet 0   GARLIC PO Take 1 tablet by mouth every other day.     hydrocortisone 2.5 % cream Apply 1 Application topically 2 (two) times daily as needed (itching).     latanoprost (XALATAN) 0.005 % ophthalmic solution Place 1 drop into both eyes at bedtime.     Multiple Vitamin (MULTIVITAMIN) tablet Take 1 tablet by mouth daily.     PRESCRIPTION MEDICATION Take 5 tablets by mouth daily as needed (to prevent thrush). Clotriamazole troche 10mg  Tablet     valsartan (DIOVAN) 160 MG tablet Take 160 mg by mouth 2 (two) times daily.     vitamin B-12 (CYANOCOBALAMIN) 1000 MCG tablet Take 1,000 mcg by mouth daily.     vitamin C (ASCORBIC ACID) 500 MG tablet Take 500 mg  by mouth daily.     Vitamin D, Ergocalciferol, (DRISDOL) 1.25 MG (50000 UNIT) CAPS capsule Take 50,000 Units by mouth every Wednesday.     Zinc 50 MG CAPS Take 1 capsule by mouth daily.     budesonide-formoterol (SYMBICORT) 160-4.5 MCG/ACT inhaler Inhale 2 puffs into the lungs 2 (two) times daily. 1 each 5   fluticasone (FLONASE) 50 MCG/ACT nasal spray Place 2 sprays into both nostrils 2 (two) times daily as needed for allergies or rhinitis. 16 g 5   montelukast (SINGULAIR) 10 MG tablet Take 1 tablet (10 mg total) by mouth daily. 30 tablet 5   pantoprazole (PROTONIX) 40 MG tablet Take 40 mg by mouth daily. (Patient not taking: Reported on 10/05/2021)     prednisoLONE acetate (PRED FORTE) 1 % ophthalmic suspension Place  1 drop into both eyes 3 (three) times daily. (Patient not taking: Reported on 10/05/2021)     No current facility-administered medications for this visit.   Known Allergies:  Allergies  Allergen Reactions   Azithromycin     Other reaction(s): gi distress, GI intolerance, Other   Levofloxacin Hives and Rash   Sulfa Antibiotics Hives    Red patches on legs years ago  Red patches on legs years ago     Past Surgical History: Past Surgical History:  Procedure Laterality Date   CATARACT EXTRACTION     NASAL SINUS SURGERY     PACEMAKER IMPLANT N/A 07/31/2021   Procedure: PACEMAKER IMPLANT;  Surgeon: Regan Lemming, MD;  Location: [facility];  Service: Cardiovascular;  Laterality: N/A;   TUBAL LIGATION     Family History: Family History  Problem Relation Age of Onset   Stroke Mother    Hypertension Mother    Hypertension Other    Social History: Balbina lives in a house built 78 years ago, hardwood floors, oil heating, central AC, no pets, no cockroaches, not using dust mite protection on the bedding or pillows, no smoke exposure in the house.  She is retired.  She has no previous smoking history, no secondhand smoke exposure.  She does have a HEPA filter in her  home.  [place].   ROS:  All other systems negative except as noted per HPI.  Objective:  Blood pressure 126/78, pulse 74, temperature 98 F (36.7 C), temperature source Temporal, resp. rate 18, height 5' 4.5" (1.638 m), weight 134 lb 12.8 oz (61.1 kg), SpO2 99 %. Body mass index is 22.78 kg/m. Physical Exam:  General Appearance:  Alert, cooperative, no distress, appears stated age  Head:  Normocephalic, without obvious abnormality, atraumatic  Eyes:  Conjunctiva injected bilaterally, significant white discharge bilaterally, EOM's intact  Nose: Nares normal, hypertrophic turbinates, normal mucosa, and no visible anterior polyps  Throat: Lips, tongue normal; teeth and gums normal, normal posterior oropharynx and + cobblestoning  Neck: Supple, symmetrical  Lungs:   clear to auscultation bilaterally, Respirations unlabored, no coughing  Heart:  regular rate and rhythm and no murmur, Appears well perfused  Extremities: No edema  Skin: Erythematous patch covering majority of face, spares parts of cheeks  Neurologic: No gross deficits     Diagnostics: Spirometry:  Tracings reviewed. Her effort: Good reproducible efforts. FVC: 1.75L (pre), 1.87L  (post), +7% improvement, 120 L FEV1: 1.0L, 57% predicted (pre), 1.15L, 65% predicted (post), +15% improvement, 150 L FEV1/FVC ratio: 73% (pre), 79% (post) Interpretation: Spirometry consistent with moderate obstructive disease with significant bronchodilator response  Testing:  True test patch testing .  Placement today   T.R.U.E. Test - 10/05/21 1134     Time Antigen Placed 0930    Lot # [id number]    Location Back    Number of Test 36    Reading Interval Day 1;Day 3;Day 5    Panel Panel 1;Panel 2;Panel 3             Allergy testing results were read and interpreted by myself, documented by clinical staff.  Assessment and Plan  Sinusitis: maxillary  - start augmentin 875 mg orally twice  daily for 10 days-take with probiotics and/or cultured yogurts  Facial Dermatitis:  - suspect contact dermatitis versus rosacea-TRUE patch testing placed today. - do not use any topical steroids for the next week - do not use hydrocortisone topical ointment  as this worsened your rash  Chronic Rhinitis -allergic: - reschedule for allergy testing since you took your clarinex this weekend - pending results, consider allergy shots as long term control of your symptoms by teaching your immune system to be more tolerant of your allergy triggers - Continue Astelin (Azelastine) 1-2 sprays in each nostril twice a day as needed.  You may use this as needed for nasal congestion/itchy ears/itchy nose if desired - continue Flonase 1-2 sprays, each nostril daily as needed - Continue Singulair (Montelukast) 10mg  nightly. - Start Zyrtec 10 mg nightly  Chronic allergic and other conjunctivitis:  - Continue Pataday eye drops - continue all other eye drops as prescribed by eye doctor - please schedule appointment with an ophthalmologist (referral placed today)  Moderate Persistent Asthma: - your lung testing today showed moderate obstruction with significant improvement following albuterol - Controller Inhaler: Start Symbicort 160 mcg 2 puffs twice a day; This Should Be Used Everyday - Rinse mouth out after use - Rescue Inhaler: Albuterol (Proair/Ventolin) 2 puffs . Use  every 4-6 hours as needed for chest tightness, wheezing, or coughing.  Can also use 15 minutes prior to exercise if you have symptoms with activity. - Asthma is not controlled if:  - Symptoms are occurring >2 times a week OR  - >2 times a month nighttime awakenings  - You are requiring systemic steroids (prednisone/steroid injections) more than once per year  - Your require hospitalization for your asthma.  - Please call the clinic to schedule a follow up if these symptoms arise  Follow-up in 4 to 6 weeks for skin testing-must stop  zyrtec (cetirizine) 3 days prior to this visit. It was a pleasure meeting you today in clinic!  This note in its entirety was forwarded to the Provider who requested this consultation.  Thank you for your kind referral. I appreciate the opportunity to take part in Alayzia's care. Please do not hesitate to contact me with questions.  Sincerely,  Sigurd Sos, MD [facility]

## 2021-10-05 ENCOUNTER — Encounter: Payer: Self-pay | Admitting: Internal Medicine

## 2021-10-05 ENCOUNTER — Ambulatory Visit: Payer: Medicare HMO | Admitting: Internal Medicine

## 2021-10-05 VITALS — BP 126/78 | HR 74 | Temp 98.0°F | Resp 18 | Ht 64.5 in | Wt 134.8 lb

## 2021-10-05 DIAGNOSIS — H10403 Unspecified chronic conjunctivitis, bilateral: Secondary | ICD-10-CM

## 2021-10-05 DIAGNOSIS — J0100 Acute maxillary sinusitis, unspecified: Secondary | ICD-10-CM

## 2021-10-05 DIAGNOSIS — H1013 Acute atopic conjunctivitis, bilateral: Secondary | ICD-10-CM | POA: Diagnosis not present

## 2021-10-05 DIAGNOSIS — L253 Unspecified contact dermatitis due to other chemical products: Secondary | ICD-10-CM

## 2021-10-05 DIAGNOSIS — J4540 Moderate persistent asthma, uncomplicated: Secondary | ICD-10-CM

## 2021-10-05 DIAGNOSIS — J310 Chronic rhinitis: Secondary | ICD-10-CM

## 2021-10-05 DIAGNOSIS — L309 Dermatitis, unspecified: Secondary | ICD-10-CM

## 2021-10-05 MED ORDER — AZELASTINE HCL 137 MCG/SPRAY NA SOLN
2.0000 | Freq: Two times a day (BID) | NASAL | 5 refills | Status: DC
Start: 2021-10-05 — End: 2022-03-05

## 2021-10-05 MED ORDER — BUDESONIDE-FORMOTEROL FUMARATE 160-4.5 MCG/ACT IN AERO: 2.0000 | INHALATION_SPRAY | Freq: Two times a day (BID) | RESPIRATORY_TRACT | 5 refills | Status: DC

## 2021-10-05 MED ORDER — FLUTICASONE PROPIONATE 50 MCG/ACT NA SUSP
2.0000 | Freq: Two times a day (BID) | NASAL | 5 refills | Status: DC | PRN
Start: 2021-10-05 — End: 2022-05-31

## 2021-10-05 MED ORDER — MONTELUKAST SODIUM 10 MG PO TABS: 10.0000 mg | ORAL_TABLET | Freq: Every day | ORAL | 5 refills | Status: DC

## 2021-10-05 MED ORDER — AMOXICILLIN-POT CLAVULANATE 875-125 MG PO TABS: 1.0000 | ORAL_TABLET | Freq: Two times a day (BID) | ORAL | 0 refills | Status: AC

## 2021-10-05 MED ORDER — CETIRIZINE HCL 10 MG PO TABS: 10.0000 mg | ORAL_TABLET | Freq: Every day | ORAL | 5 refills | Status: DC

## 2021-10-05 NOTE — Patient Instructions (Addendum)
Sinusitis: maxillary  - start augmentin 875 mg orally twice daily for 10 days-take with probiotics and/or cultured yogurts  Facial Dermatitis:  - suspect contact dermatitis versus rosacea-TRUE patch testing placed today. - do not use any topical steroids for the next week  - do not use hydrocortisone topical ointment as this worsened your rash  Chronic Rhinitis -allergic: - reschedule for allergy testing since you took your clarinex this weekend - pending results, consider allergy shots as long term control of your symptoms by teaching your immune system to be more tolerant of your allergy triggers - Continue Astelin (Azelastine) 1-2 sprays in each nostril twice a day as needed.  You may use this as needed for nasal congestion/itchy ears/itchy nose if desired - continue Flonase 1-2 sprays, each nostril daily as needed - Continue Singulair (Montelukast) 10mg  nightly. - Start Zyrtec 10 mg nightly  Chronic allergic and other conjunctivitis:  - Continue Pataday eye drops - continue all other eye drops as prescribed by eye doctor - please schedule appointment with an ophthalmologist (referral placed today)   Moderate Persistent Asthma: - your lung testing today showed moderate obstruction with significant improvement following albuterol - Controller Inhaler: Start Symbicort 160 mcg 2 puffs twice a day; This Should Be Used Everyday - Rinse mouth out after use - Rescue Inhaler: Albuterol (Proair/Ventolin) 2 puffs . Use  every 4-6 hours as needed for chest tightness, wheezing, or coughing.  Can also use 15 minutes prior to exercise if you have symptoms with activity. - Asthma is not controlled if:  - Symptoms are occurring >2 times a week OR  - >2 times a month nighttime awakenings  - You are requiring systemic steroids (prednisone/steroid injections) more than once per year  - Your require hospitalization for your asthma.  - Please call the clinic to schedule a follow up if these symptoms  arise  Follow-up in 4 to 6 weeks for skin testing-must stop zyrtec (cetirizine) 3 days prior to this visit. It was a pleasure meeting you today in clinic!  , MD [facility]

## 2021-10-07 ENCOUNTER — Ambulatory Visit: Payer: Medicare HMO | Admitting: Internal Medicine

## 2021-10-07 DIAGNOSIS — L235 Allergic contact dermatitis due to other chemical products: Secondary | ICD-10-CM

## 2021-10-07 NOTE — Progress Notes (Signed)
   Follow Up Note  RE: [name] MRN: [id number] DOB: 07-10-1945 Date of Office Visit: 10/07/2021  Referring provider: Knox Royalty, MD Primary care provider: Knox Royalty, MD  History of Present Illness: I had the pleasure of seeing [name] for a follow up visit at the [facility] on 10/07/2021. She is a 76 y.o. female, who is being followed for facial dermatitis. Today she is here for patch test placement, given suspected history of contact dermatitis.   She also reports that she has lost her symbicort inhaler which she picked up yesterday and requests a possible refill  Diagnostics:  TRUE TEST 48 hour reading:  Positive for black rubber mix   T.R.U.E. Test - 10/07/21 1400     Time Antigen Placed 1410    Lot # [id number]    Location Back    Number of Test 36    Reading Interval Day 3    Panel Panel 1;Panel 2;Panel 3    1. Nickel Sulfate 0    2. Wool Alcohols 0    3. Neomycin Sulfate 0    4. Potassium Dichromate 0    5. Caine Mix 0    6. Fragrance Mix 0    7. Colophony 0    8. Paraben Mix 0    9. Negative Control 0    10. Balsam of [place] 0    11. Ethylenediamine Dihydrochloride 0    12. Cobalt Dichloride 0    13. p-tert Butylphenol Formaldehyde Resin 0    14. Epoxy Resin 0    15. Carba Mix 0    16.  Black Rubber Mix 2    17. Cl+ Me-Isothiazolinone 0    18. Quaternium-15 0    19. Methyldibromo Glutaronitrile 0    20. p-Phenylenediamine 0    21. Formaldehyde 0    22. Mercapto Mix 0    23. Thimerosal 0    24. Thiuram Mix 0    25. Diazolidinyl Urea 0    26. Quinoline Mix 0    27. Tixocortol-21-Pivalate 0    28. Gold Sodium Thiosulfate 0    29. Imidazolidinyl Urea 0    30. Budesonide 0    31. Hydrocortisone-17-Butyrate 0    32. Mercaptobenzothiazole 0    33. Bacitracin 0    34. Parthenolide 0    35. Disperse Blue 106 0    36. 2-Bromo-2-Nitropropane-1,3-diol 0              Assessment and Plan: [name] is a 76 y.o. female  with: Concern for Contact Dermatitis:  The patient has been provided detailed information regarding the substances she is sensitive to, as well as products containing the substances.  Meticulous avoidance of these substances is recommended. If avoidance is not possible, the use of barrier creams or lotions is recommended. If symptoms persist or progress despite meticulous avoidance of chemicals/substances above, dermatology evaluation may be warranted. No follow-ups on file.  It was my pleasure to see [name] today and participate in her care. Please feel free to contact me with any questions or concerns.  Sincerely,   Ferol Luz, MD [facility]

## 2021-10-09 ENCOUNTER — Ambulatory Visit: Payer: Medicare HMO | Admitting: Internal Medicine

## 2021-10-09 ENCOUNTER — Telehealth: Payer: Self-pay | Admitting: Internal Medicine

## 2021-10-09 DIAGNOSIS — L308 Other specified dermatitis: Secondary | ICD-10-CM

## 2021-10-09 NOTE — Telephone Encounter (Signed)
Patient called and stated that she was waiting for [place] to get back to her about a referral to ophthalmology. I looked in her chart notes and patient encounters but didn't see a referral for Ophthalmology. I asked Albin Felling about it and she stated that I should tell the patient to call the number on the back of her insurance card to find an Ophthalmologist that is in network, and to then call us back with the info so that we can send records to them. I made patient aware of this and she was unhappy that she wasn't told this a week ago. I apologized to patient for the misunderstanding.

## 2021-10-09 NOTE — Progress Notes (Signed)
   Follow Up Note  RE: [name] MRN: [id number] DOB: 02/28/1945 Date of Office Visit: 10/09/2021  Referring provider: Knox Royalty, MD Primary care provider: Knox Royalty, MD  History of Present Illness: I had the pleasure of seeing [name] for a follow up visit at the [facility] on 10/09/2021. She is a 76 y.o. female, who is being followed for eyelid dermatitis . Today she is here for final patch test interpretation, given suspected history of contact dermatitis.   She was seen by dermatology on Wednesday and has a follow-up with them next week.  She is complaining of persistent ocular pruritus and blurred vision.  Ophthalmology referral was placed on 10/05/2021.  Diagnostics:  TRUE TEST 96 hour reading:   T.R.U.E. Test - 10/09/21 0900     Time Antigen Placed 0949    Lot # [id number]    Location Back    Number of Test 36    Reading Interval --   day5   Panel Panel 1;Panel 2    1. Nickel Sulfate 0    2. Wool Alcohols 0    3. Neomycin Sulfate 0    4. Potassium Dichromate 0    5. Caine Mix 0    6. Fragrance Mix 0    7. Colophony 0    8. Paraben Mix 0    9. Negative Control 0    10. Balsam of [place] 0    11. Ethylenediamine Dihydrochloride 0    12. Cobalt Dichloride 0    13. p-tert Butylphenol Formaldehyde Resin 0    14. Epoxy Resin 0    15. Carba Mix 0    16.  Black Rubber Mix 2    17. Cl+ Me-Isothiazolinone 0    18. Quaternium-15 0    19. Methyldibromo Glutaronitrile 0    20. p-Phenylenediamine 2    21. Formaldehyde 0    22. Mercapto Mix 0    23. Thimerosal 0    24. Thiuram Mix 0    25. Diazolidinyl Urea 0    26. Quinoline Mix 0    27. Tixocortol-21-Pivalate 0    28. Gold Sodium Thiosulfate 0    29. Imidazolidinyl Urea 0    30. Budesonide 0    31. Hydrocortisone-17-Butyrate 0    32. Mercaptobenzothiazole 0    33. Bacitracin 0    34. Parthenolide 0    35. Disperse Blue 106 0    36. 2-Bromo-2-Nitropropane-1,3-diol 0            Positive for black rubber mix and phenylenediamine    Assessment and Plan: [name] is a 76 y.o. female with: Concern for Contact Dermatitis:  The patient has been provided detailed information regarding the substances she is sensitive to, as well as products containing the substances.  Meticulous avoidance of these substances is recommended. If avoidance is not possible, the use of barrier creams or lotions is recommended. If symptoms persist or progress despite meticulous avoidance of chemicals/substances above, dermatology evaluation may be warranted. No follow-ups on file.  It was my pleasure to see [name] today and participate in her care. Please feel free to contact me with any questions or concerns.  Sincerely,   Ferol Luz, MD [facility]

## 2021-10-14 ENCOUNTER — Telehealth: Payer: Self-pay | Admitting: Internal Medicine

## 2021-10-14 NOTE — Telephone Encounter (Signed)
Patient stating she has lost her voice over the last couple of days asking for a nurse to call her deny's fever and no congestion please advise

## 2021-10-14 NOTE — Telephone Encounter (Signed)
PLEASE ADVISE to voice lost

## 2021-10-16 NOTE — Telephone Encounter (Signed)
She should set up an appointment with Dr. Maurine Minister next week to better evaluate.  Thanks!

## 2021-10-19 NOTE — Telephone Encounter (Signed)
Appt oct 2nd

## 2021-11-02 ENCOUNTER — Ambulatory Visit (INDEPENDENT_AMBULATORY_CARE_PROVIDER_SITE_OTHER): Payer: Medicare HMO

## 2021-11-02 DIAGNOSIS — I441 Atrioventricular block, second degree: Secondary | ICD-10-CM

## 2021-11-02 LAB — CUP PACEART REMOTE DEVICE CHECK
Battery Remaining Longevity: 115 mo
Battery Remaining Percentage: 95.5 %
Battery Voltage: 3.02 V
Brady Statistic AP VP Percent: 29 %
Brady Statistic AP VS Percent: 2.1 %
Brady Statistic AS VP Percent: 57 %
Brady Statistic AS VS Percent: 9.7 %
Brady Statistic RA Percent Paced: 29 %
Brady Statistic RV Percent Paced: 86 %
Date Time Interrogation Session: 20230923025456
Implantable Lead Implant Date: 20230623
Implantable Lead Implant Date: 20230623
Implantable Lead Location: 753859
Implantable Lead Location: 753860
Implantable Pulse Generator Implant Date: 20230623
Lead Channel Impedance Value: 430 Ohm
Lead Channel Impedance Value: 430 Ohm
Lead Channel Pacing Threshold Amplitude: 0.5 V
Lead Channel Pacing Threshold Amplitude: 1.375 V
Lead Channel Pacing Threshold Pulse Width: 0.5 ms
Lead Channel Pacing Threshold Pulse Width: 0.5 ms
Lead Channel Sensing Intrinsic Amplitude: 12 mV
Lead Channel Sensing Intrinsic Amplitude: 5 mV
Lead Channel Setting Pacing Amplitude: 1.5 V
Lead Channel Setting Pacing Amplitude: 1.625
Lead Channel Setting Pacing Pulse Width: 0.5 ms
Lead Channel Setting Sensing Sensitivity: 2 mV
Pulse Gen Model: 2272
Pulse Gen Serial Number: 8092211

## 2021-11-03 ENCOUNTER — Encounter: Payer: Self-pay | Admitting: Cardiology

## 2021-11-03 ENCOUNTER — Ambulatory Visit: Payer: Medicare HMO | Attending: Cardiology | Admitting: Cardiology

## 2021-11-03 VITALS — BP 134/72 | HR 61 | Ht 64.5 in | Wt 135.4 lb

## 2021-11-03 DIAGNOSIS — I441 Atrioventricular block, second degree: Secondary | ICD-10-CM | POA: Diagnosis not present

## 2021-11-03 NOTE — Progress Notes (Signed)
Electrophysiology Office Note   Date:  11/03/2021   ID:  [name], DOB 11-18-1945, MRN [id number]  PCP:  Knox Royalty, MD  Cardiologist:  Bjorn Pippin Primary Electrophysiologist:  Kennesha Brewbaker Jorja Loa, MD    Chief Complaint: pacemaker   History of Present Illness: [name] is a 76 y.o. female who is being seen today for the evaluation of pacemaker at the request of Knox Royalty, MD. Presenting today for electrophysiology evaluation.  Has a history significant for hypertension.  She was admitted to the hospital 07/30/2021 with heart block.  She is now status post Abbott dual-chamber pacemaker implanted 07/31/2021.  Today, she denies symptoms of palpitations, chest pain, shortness of breath, orthopnea, PND, lower extremity edema, claudication, dizziness, presyncope, syncope, bleeding, or neurologic sequela. The patient is tolerating medications without difficulties.    Past Medical History:  Diagnosis Date   HTN (hypertension) 08/01/2021   Hypertension    Sinusitis    Status post placement of cardiac pacemaker 07/31/21 St Jude Medical Assurity MRI dual-chamber pacemaker  08/01/2021   Symptomatic bradycardia 08/01/2021   Past Surgical History:  Procedure Laterality Date   CATARACT EXTRACTION     NASAL SINUS SURGERY     PACEMAKER IMPLANT N/A 07/31/2021   Procedure: PACEMAKER IMPLANT;  Surgeon: Regan Lemming, MD;  Location: [facility];  Service: Cardiovascular;  Laterality: N/A;   TUBAL LIGATION       Current Outpatient Medications  Medication Sig Dispense Refill   acetaminophen (TYLENOL) 650 MG CR tablet Take 650 mg by mouth every 8 (eight) hours as needed for pain.     albuterol (PROVENTIL HFA;VENTOLIN HFA) 108 (90 BASE) MCG/ACT inhaler Inhale 2 puffs into the lungs every 6 (six) hours as needed for wheezing or shortness of breath.     alendronate (FOSAMAX) 70 MG tablet Take 70 mg by mouth every Friday.     ALPHAGAN P 0.1 % SOLN Place 1 drop into both  eyes 2 (two) times daily.     amLODipine (NORVASC) 10 MG tablet Take 10 mg by mouth daily.     atorvastatin (LIPITOR) 20 MG tablet Take 20 mg by mouth daily.     Azelastine HCl 137 MCG/SPRAY SOLN Place 2 sprays into both nostrils 2 (two) times daily. 30 mL 5   budesonide-formoterol (SYMBICORT) 160-4.5 MCG/ACT inhaler Inhale 2 puffs into the lungs 2 (two) times daily. 1 each 5   calcitRIOL (ROCALTROL) 0.5 MCG capsule Take 0.5 mcg by mouth daily.     cetirizine (ZYRTEC ALLERGY) 10 MG tablet Take 1 tablet (10 mg total) by mouth daily. 30 tablet 5   Cod Liver Oil CAPS Take 1 capsule by mouth daily.     dicyclomine (BENTYL) 20 MG tablet Take 1 tablet (20 mg total) by mouth in the morning, at noon, and at bedtime. 90 tablet 0   fluticasone (FLONASE) 50 MCG/ACT nasal spray Place 2 sprays into both nostrils 2 (two) times daily as needed for allergies or rhinitis. 16 g 5   GARLIC PO Take 1 tablet by mouth every other day.     ketoconazole (NIZORAL) 2 % shampoo Apply topically.     latanoprost (XALATAN) 0.005 % ophthalmic solution Place 1 drop into both eyes at bedtime.     montelukast (SINGULAIR) 10 MG tablet Take 1 tablet (10 mg total) by mouth daily. 30 tablet 5   Multiple Vitamin (MULTIVITAMIN) tablet Take 1 tablet by mouth daily.     pantoprazole (PROTONIX) 40 MG tablet Take  40 mg by mouth daily.     PRESCRIPTION MEDICATION Take 5 tablets by mouth daily as needed (to prevent thrush). Clotriamazole troche 10mg  Tablet     triamcinolone ointment (KENALOG) 0.1 % Apply 1 Application topically 2 (two) times daily.     valsartan (DIOVAN) 160 MG tablet Take 160 mg by mouth 2 (two) times daily.     vitamin B-12 (CYANOCOBALAMIN) 1000 MCG tablet Take 1,000 mcg by mouth daily.     vitamin C (ASCORBIC ACID) 500 MG tablet Take 500 mg by mouth daily.     Vitamin D, Ergocalciferol, (DRISDOL) 1.25 MG (50000 UNIT) CAPS capsule Take 50,000 Units by mouth every Wednesday.     Zinc 50 MG CAPS Take 1 capsule by mouth  daily.     hydrocortisone 2.5 % cream Apply 1 Application topically 2 (two) times daily as needed (itching). (Patient not taking: Reported on 11/03/2021)     prednisoLONE acetate (PRED FORTE) 1 % ophthalmic suspension Place 1 drop into both eyes 3 (three) times daily. (Patient not taking: Reported on 11/03/2021)     No current facility-administered medications for this visit.    Allergies:   Azithromycin, Levofloxacin, and Sulfa antibiotics   Social History:  The patient  reports that she has never smoked. She does not have any smokeless tobacco history on file. She reports that she does not drink alcohol and does not use drugs.   Family History:  The patient's family history includes Hypertension in her mother and another family member; Stroke in her mother.    ROS:  Please see the history of present illness.   Otherwise, review of systems is positive for none.   All other systems are reviewed and negative.    PHYSICAL EXAM: VS:  BP 134/72   Pulse 61   Ht 5' 4.5" (1.638 m)   Wt 135 lb 6.4 oz (61.4 kg)   SpO2 97%   BMI 22.88 kg/m  , BMI Body mass index is 22.88 kg/m. GEN: Well nourished, well developed, in no acute distress  HEENT: normal  Neck: no JVD, carotid bruits, or masses Cardiac: RRR; no murmurs, rubs, or gallops,no edema  Respiratory:  clear to auscultation bilaterally, normal work of breathing GI: soft, nontender, nondistended, + BS MS: no deformity or atrophy  Skin: warm and dry, device pocket is well healed Neuro:  Strength and sensation are intact Psych: euthymic mood, full affect  EKG:  EKG is ordered today. Personal review of the ekg ordered shows sinus rhythm, rate 61, ventricular paced  Device interrogation is reviewed today in detail.  See PaceArt for details.   Recent Labs: 07/30/2021: Magnesium 2.1; TSH 1.512 08/01/2021: BUN 16; Creatinine, Ser 0.75; Hemoglobin 12.2; Platelets 234; Potassium 3.3; Sodium 137    Lipid Panel  No results found for: "CHOL",  "TRIG", "HDL", "CHOLHDL", "VLDL", "LDLCALC", "LDLDIRECT"   Wt Readings from Last 3 Encounters:  11/03/21 135 lb 6.4 oz (61.4 kg)  10/05/21 134 lb 12.8 oz (61.1 kg)  08/21/21 133 lb 6.4 oz (60.5 kg)      Other studies Reviewed: Additional studies/ records that were reviewed today include: TTE 07/31/21  Review of the above records today demonstrates:   1. There is a dynamic mid LV gradient ( peak gradient of 15 mmHg with  mean gradient of 7 mmHg). Left ventricular ejection fraction, by  estimation, is 60 to 65%. The left ventricle has normal function. The left  ventricle has no regional wall motion  abnormalities. Left ventricular diastolic  parameters were normal.   2. Right ventricular systolic function is normal. The right ventricular  size is normal. There is normal pulmonary artery systolic pressure.   3. Left atrial size was mild to moderately dilated.   4. The mitral valve is normal in structure. Mild mitral valve  regurgitation.   5. The aortic valve is calcified. Aortic valve regurgitation is trivial.    ASSESSMENT AND PLAN:  1.  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  Device functioning appropriately.  No changes at this time.  2.  Hypertension: Currently well controlled  Current medicines are reviewed at length with the patient today.   The patient does not have concerns regarding her medicines.  The following changes were made today:  none  Labs/ tests ordered today include:  Orders Placed This Encounter  Procedures   EKG 12-Lead     Disposition:   FU with Julie-Anne Torain 1 year  Signed, Marzell Allemand Meredith Leeds, MD  11/03/2021 4:34 PM     [address] [place] [address] [phone] (office) [phone] (fax)

## 2021-11-03 NOTE — Patient Instructions (Signed)
Medication Instructions:  Your physician recommends that you continue on your current medications as directed. Please refer to the Current Medication list given to you today.  *If you need a refill on your cardiac medications before your next appointment, please call your pharmacy*   Lab Work: None ordered   Testing/Procedures: None ordered   Follow-Up: At [facility], you and your health needs are our priority.  As part of our continuing mission to provide you with exceptional heart care, we have created designated Provider Care Teams.  These Care Teams include your primary Cardiologist (physician) and Advanced Practice Providers (APPs -  Physician Assistants and Nurse Practitioners) who all work together to provide you with the care you need, when you need it.  We recommend signing up for the patient portal called "MyChart".  Sign up information is provided on this After Visit Summary.  MyChart is used to connect with patients for Virtual Visits (Telemedicine).  Patients are able to view lab/test results, encounter notes, upcoming appointments, etc.  Non-urgent messages can be sent to your provider as well.   To learn more about what you can do with MyChart, go to [email or web address].    Remote monitoring is used to monitor your Pacemaker or ICD from home. This monitoring reduces the number of office visits required to check your device to one time per year. It allows [place] to keep an eye on the functioning of your device to ensure it is working properly. You are scheduled for a device check from home on 02/02/2022. You may send your transmission at any time that day. If you have a wireless device, the transmission will be sent automatically. After your physician reviews your transmission, you will receive a postcard with your next transmission date.  Your next appointment:   1 year(s)  The format for your next appointment:   In Person  Provider:   You will see one of the following  Advanced Practice Providers on your designated Care Team:   Tommye Standard, [place] Legrand Como "Jonni Sanger" Chalmers Cater, [place]     Thank you for choosing [facility]!!   Trinidad Curet, RN [phone]    Other Instructions  Important Information About Sugar

## 2021-11-04 LAB — CUP PACEART INCLINIC DEVICE CHECK
Battery Remaining Longevity: 112 mo
Battery Voltage: 3.02 V
Brady Statistic RA Percent Paced: 29 %
Brady Statistic RV Percent Paced: 86 %
Date Time Interrogation Session: 20230926154900
Implantable Lead Implant Date: 20230623
Implantable Lead Implant Date: 20230623
Implantable Lead Location: 753859
Implantable Lead Location: 753860
Implantable Pulse Generator Implant Date: 20230623
Lead Channel Impedance Value: 425 Ohm
Lead Channel Impedance Value: 450 Ohm
Lead Channel Pacing Threshold Amplitude: 0.5 V
Lead Channel Pacing Threshold Amplitude: 0.5 V
Lead Channel Pacing Threshold Amplitude: 1.5 V
Lead Channel Pacing Threshold Amplitude: 1.5 V
Lead Channel Pacing Threshold Pulse Width: 0.5 ms
Lead Channel Pacing Threshold Pulse Width: 0.5 ms
Lead Channel Pacing Threshold Pulse Width: 0.5 ms
Lead Channel Pacing Threshold Pulse Width: 0.5 ms
Lead Channel Sensing Intrinsic Amplitude: 11.3 mV
Lead Channel Sensing Intrinsic Amplitude: 5 mV
Lead Channel Setting Pacing Amplitude: 1.375
Lead Channel Setting Pacing Amplitude: 1.625
Lead Channel Setting Pacing Pulse Width: 0.5 ms
Lead Channel Setting Sensing Sensitivity: 2 mV
Pulse Gen Model: 2272
Pulse Gen Serial Number: 8092211

## 2021-11-06 NOTE — Progress Notes (Unsigned)
FOLLOW UP Date of Service/Encounter:  11/09/21   Subjective:  [name] (DOB: [birth year]) is a 76 y.o. female with second degree AV block, hypertension, GERwho returns to [facility] in re-evaluation of the following: asthma, allergic rhinitis History obtained from: chart review and patient.  For Review, LV was on 10/05/21  with Dr.Ola Fawver seen for intial visit for asthma and allergic rhinitis .She had significant thick mucus production and drainage ongoing for 3 to 4 weeks which we opted to treat with antibiotics for concern for bacterial sinusitis.  Pertinent History/Diagnostics:  - Asthma: She does have a recent pacemaker due to heart block.  Has been on Symbicort for many years.  Never hospitalized due to asthma.  Following up with a pulmonologist for asthma management.  -Spirometry showed moderate obstructive disease with significant bronchodilator response (73% ratio, FEV1 57% on pre and 65% on post) - Allergic Rhinitis: moved from [place] about one year ago due to unexpected loss of husband. Has itchy eyes since March of 2023, using steroid eye drops and pataday without relief. Has not been evaluated by ophtalmologist.  Clarinex has not been helpful for her.  She was receiving AIT at [facility] but is transferring care.  Had done a course of AIT many years ago while living in [place] -Dermatitis-red itchy rash on her face, following up with dermatology.  Has history of eczema and rosacea treated in the past with mometasone, Elocon, metronidazole gel.  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 She is here to undergo environmental allergy testing. She is still having a lot of issues with her eyes.  She continues to have constant drainage and watering affecting bilateral eyelids. Has not yet been able to establish with an ophthalmologist, but her eye doctor is helping her get set up with  [place] [facility]. She is following with Dermatology who is helping manage her rash and this has improved.  Her asthma has been overall controlled since starting Symbicort and she has not needed to use her albuterol inhaler. She continues to have issues with her sinuses.  She thought she was going to have an evaluation by an ear nose and throat doctor, but was scheduled with another allergist.  No further testing was pursued.  She has not been seen by ENT, but has upcoming visit November 13.  She is using Singulair and her nasal sprays as directed.  He takes Zyrtec daily but stopped in preparation for today's visit.  Allergies as of 11/09/2021       Reactions   Azithromycin    Other reaction(s): gi distress, GI intolerance, Other   Levofloxacin Hives, Rash   Sulfa Antibiotics Hives   Red patches on legs years ago  Red patches on legs years ago         Medication List        Accurate as of November 09, 2021 12:49 PM. If you have any questions, ask your nurse or doctor.          acetaminophen 650 MG CR tablet Commonly known as: TYLENOL Take 650 mg by mouth every 8 (eight) hours as needed for pain.   albuterol 108 (90 Base) MCG/ACT inhaler Commonly known as: VENTOLIN HFA Inhale 2 puffs into the lungs every 6 (six) hours as needed for wheezing or shortness of breath.   alendronate 70 MG tablet Commonly known as: FOSAMAX Take 70 mg by mouth  every Friday.   Alphagan P 0.1 % Soln Generic drug: brimonidine Place 1 drop into both eyes 2 (two) times daily.   amLODipine 10 MG tablet Commonly known as: NORVASC Take 10 mg by mouth daily.   ascorbic acid 500 MG tablet Commonly known as: VITAMIN C Take 500 mg by mouth daily.   atorvastatin 20 MG tablet Commonly known as: LIPITOR Take 20 mg by mouth daily.   Azelastine HCl 137 MCG/SPRAY Soln Place 2 sprays into both nostrils 2 (two) times daily.   budesonide-formoterol 160-4.5 MCG/ACT inhaler Commonly known as:  SYMBICORT Inhale 2 puffs into the lungs 2 (two) times daily.   calcitRIOL 0.5 MCG capsule Commonly known as: ROCALTROL Take 0.5 mcg by mouth daily.   cetirizine 10 MG tablet Commonly known as: ZyrTEC Allergy Take 1 tablet (10 mg total) by mouth daily.   Cod Liver Oil Caps Take 1 capsule by mouth daily.   cyanocobalamin 1000 MCG tablet Commonly known as: VITAMIN B12 Take 1,000 mcg by mouth daily.   dicyclomine 20 MG tablet Commonly known as: BENTYL Take 1 tablet (20 mg total) by mouth in the morning, at noon, and at bedtime.   fluticasone 50 MCG/ACT nasal spray Commonly known as: FLONASE Place 2 sprays into both nostrils 2 (two) times daily as needed for allergies or rhinitis.   GARLIC PO Take 1 tablet by mouth every other day.   hydrocortisone 2.5 % cream Apply 1 Application topically 2 (two) times daily as needed (itching).   ketoconazole 2 % shampoo Commonly known as: NIZORAL Apply topically.   latanoprost 0.005 % ophthalmic solution Commonly known as: XALATAN Place 1 drop into both eyes at bedtime.   montelukast 10 MG tablet Commonly known as: SINGULAIR Take 1 tablet (10 mg total) by mouth daily.   multivitamin tablet Take 1 tablet by mouth daily.   pantoprazole 40 MG tablet Commonly known as: PROTONIX Take 40 mg by mouth daily.   prednisoLONE acetate 1 % ophthalmic suspension Commonly known as: PRED FORTE Place 1 drop into both eyes 3 (three) times daily.   PRESCRIPTION MEDICATION Take 5 tablets by mouth daily as needed (to prevent thrush). Clotriamazole troche 10mg  Tablet   triamcinolone ointment 0.1 % Commonly known as: KENALOG Apply 1 Application topically 2 (two) times daily.   valsartan 160 MG tablet Commonly known as: DIOVAN Take 160 mg by mouth 2 (two) times daily.   Vitamin D (Ergocalciferol) 1.25 MG (50000 UNIT) Caps capsule Commonly known as: DRISDOL Take 50,000 Units by mouth every Wednesday.   Zinc 50 MG Caps Take 1 capsule by  mouth daily.       Past Medical History:  Diagnosis Date   HTN (hypertension) 08/01/2021   Hypertension    Sinusitis    Status post placement of cardiac pacemaker 07/31/21 St Jude Medical Assurity MRI dual-chamber pacemaker  08/01/2021   Symptomatic bradycardia 08/01/2021   Past Surgical History:  Procedure Laterality Date   CATARACT EXTRACTION     NASAL SINUS SURGERY     PACEMAKER IMPLANT N/A 07/31/2021   Procedure: PACEMAKER IMPLANT;  Surgeon: 08/02/2021, MD;  Location: [facility];  Service: Cardiovascular;  Laterality: N/A;   TUBAL LIGATION     Otherwise, there have been no changes to her past medical history, surgical history, family history, or social history.  ROS: All others negative except as noted per HPI.   Objective:  BP 120/60   Pulse 63   Temp 98.1 F (36.7 C) (Temporal)  Resp 18   Wt 134 lb 6.4 oz (61 kg)   SpO2 99%   BMI 22.71 kg/m  Body mass index is 22.71 kg/m. Physical Exam: General Appearance:  Alert, cooperative, no distress, appears stated age  Head:  Normocephalic, without obvious abnormality, atraumatic  Eyes:  Conjunctiva clear, EOM's intact  Nose: Nares normal, hypertrophic turbinates, normal mucosa, no visible anterior polyps, and septum midline  Throat: Lips, tongue normal; teeth and gums normal,  dry mucous membranes and normal posterior oropharynx  Neck: Supple, symmetrical  Lungs:   clear to auscultation bilaterally, Respirations unlabored, no coughing  Heart:  regular rate and rhythm and no murmur, Appears well perfused  Extremities: No edema  Skin: Skin color, texture, turgor normal, no rashes or lesions on visualized portions of skin  Neurologic: No gross deficits    Spirometry:  Tracings reviewed. Her effort: Good reproducible efforts. FVC: 1.94L FEV1: 1.19L, 68% predicted FEV1/FVC ratio: 0.61L Interpretation: Spirometry consistent with moderate obstructive disease.  Please see scanned spirometry results for  details.  Skin Testing: Environmental allergy panel. Adequate positive and negative controls Results discussed with patient/family.  Airborne Adult Perc - 11/09/21 1027     Time Antigen Placed 1027    Allergen Manufacturer Waynette Buttery    Location Back    Number of Test 59    Panel 1 Select    1. Control-Buffer 50% Glycerol Negative    2. Control-Histamine 1 mg/ml 3+    3. Albumin saline Negative    4. Bahia Negative    5. French Southern Territories Negative    6. Johnson Negative    7. [place] Blue Negative    8. Meadow Fescue Negative    9. Perennial Rye Negative    10. Sweet Vernal Negative    11. Timothy Negative    12. Cocklebur Negative    13. Burweed Marshelder Negative    14. Ragweed, short Negative    15. Ragweed, Giant Negative    16. Plantain,  English Negative    17. Lamb's Quarters Negative    18. Sheep Sorrell Negative    19. Rough Pigweed Negative    20. Marsh Elder, Rough Negative    21. Mugwort, Common Negative    22. Ash mix Negative    23. Birch mix Negative    24. Beech American Negative    25. Box, Elder Negative    26. Cedar, red Negative    27. Cottonwood, Guinea-Bissau Negative    28. Elm mix Negative    29. Hickory Negative    30. Maple mix Negative    31. Oak, Guinea-Bissau mix Negative    32. Pecan Pollen Negative    33. Pine mix Negative    34. Sycamore Eastern Negative    35. Walnut, Black Pollen Negative    36. Alternaria alternata Negative    37. Cladosporium Herbarum Negative    38. Aspergillus mix Negative    39. Penicillium mix Negative    40. Bipolaris sorokiniana (Helminthosporium) Negative    41. Drechslera spicifera (Curvularia) Negative    42. Mucor plumbeus Negative    43. Fusarium moniliforme Negative    44. Aureobasidium pullulans (pullulara) Negative    45. Rhizopus oryzae Negative    46. Botrytis cinera Negative    47. Epicoccum nigrum Negative    48. Phoma betae Negative    49. Candida Albicans Negative    50. Trichophyton mentagrophytes Negative     51. Mite, D Farinae  5,000 AU/ml Negative    52.  Mite, D Pteronyssinus  5,000 AU/ml Negative    53. Cat Hair 10,000 BAU/ml Negative    54.  Dog Epithelia Negative    55. Mixed Feathers Negative    56. Horse Epithelia Negative    57. Cockroach, German Negative    58. Mouse Negative    59. Tobacco Leaf Negative             Intradermal - 11/09/21 1128     Time Antigen Placed 1128    Allergen Manufacturer Waynette Buttery    Location Arm    Number of Test 15    Intradermal Select    Control Negative    French Southern Territories Negative    Johnson Negative    7 Grass Negative    Ragweed mix Negative    Weed mix Negative    Tree mix Negative    Mold 1 Negative    Mold 2 Negative    Mold 3 Negative    Mold 4 Negative    Cat Negative    Dog Negative    Cockroach Negative    Mite mix Negative             Allergy testing results were read and interpreted by myself, documented by clinical staff.  Assessment/Plan   Moderate Persistent Asthma:controlled - your lung testing today continues to show moderate obstruction - Controller Inhaler: Continue Symbicort 160 mcg 2 puffs twice a day; This Should Be Used Everyday - Rinse mouth out after use - Continue Singulair (Montelukast) 10mg  nightly. - Rescue Inhaler: Albuterol (Proair/Ventolin) 2 puffs . Use  every 4-6 hours as needed for chest tightness, wheezing, or coughing.  Can also use 15 minutes prior to exercise if you have symptoms with activity. - Asthma is not controlled if:  - Symptoms are occurring >2 times a week OR  - >2 times a month nighttime awakenings  - You are requiring systemic steroids (prednisone/steroid injections) more than once per year  - Your require hospitalization for your asthma.  - Please call the clinic to schedule a follow up if these symptoms arise  Chronic rhinitis-nonallergic-uncontrolled -allergy skin testing today was negative, intradermals also negative - Continue Astelin (Azelastine) 1-2 sprays in each nostril  twice a day as needed.  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  It was a pleasure seeing you again today in clinic!  , MD  [facility]

## 2021-11-09 ENCOUNTER — Encounter: Payer: Self-pay | Admitting: Internal Medicine

## 2021-11-09 ENCOUNTER — Ambulatory Visit: Payer: Medicare HMO | Admitting: Internal Medicine

## 2021-11-09 VITALS — BP 120/60 | HR 63 | Temp 98.1°F | Resp 18 | Wt 134.4 lb

## 2021-11-09 DIAGNOSIS — J310 Chronic rhinitis: Secondary | ICD-10-CM | POA: Diagnosis not present

## 2021-11-09 DIAGNOSIS — H1013 Acute atopic conjunctivitis, bilateral: Secondary | ICD-10-CM | POA: Diagnosis not present

## 2021-11-09 DIAGNOSIS — L309 Dermatitis, unspecified: Secondary | ICD-10-CM

## 2021-11-09 DIAGNOSIS — J4540 Moderate persistent asthma, uncomplicated: Secondary | ICD-10-CM | POA: Diagnosis not present

## 2021-11-09 DIAGNOSIS — H10403 Unspecified chronic conjunctivitis, bilateral: Secondary | ICD-10-CM

## 2021-11-09 NOTE — Patient Instructions (Addendum)
Moderate Persistent Asthma:controlled - your lung testing today continues to show moderate obstruction - Controller Inhaler: Continue Symbicort 160 mcg 2 puffs twice a day; This Should Be Used Everyday - Rinse mouth out after use - Continue Singulair (Montelukast) 10mg  nightly. - Rescue Inhaler: Albuterol (Proair/Ventolin) 2 puffs . Use  every 4-6 hours as needed for chest tightness, wheezing, or coughing.  Can also use 15 minutes prior to exercise if you have symptoms with activity. - Asthma is not controlled if:  - Symptoms are occurring >2 times a week OR  - >2 times a month nighttime awakenings  - You are requiring systemic steroids (prednisone/steroid injections) more than once per year  - Your require hospitalization for your asthma.  - Please call the clinic to schedule a follow up if these symptoms arise  Chronic rhinitis-nonallergic -allergy skin testing today was negative, intradermals also negative - Continue Astelin (Azelastine) 1-2 sprays in each nostril twice a day as needed.  You may use this as needed for nasal congestion/itchy ears/itchy nose if desired - continue Flonase 1-2 sprays, each nostril daily as needed -  Discontinue zyrtec-if symptoms return, restart - follow-up with ENT as planned  NONALLERGIC Conjuntivitis: possible blepharitis  - Continue Pataday eye drops - continue all other eye drops as prescribed by eye doctor - use a mild hypoallergenic body wash (vanicream samples provided or can use rinse provided by dermatology) and use a softer stool toothbrush to gently cleanse eyelids morning and night for the next few weeks - follow-up with ophthalmologist  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  It was a pleasure seeing you again today in clinic!  Sigurd Sos, MD [facility]

## 2021-11-17 ENCOUNTER — Ambulatory Visit: Payer: Self-pay | Admitting: Allergy & Immunology

## 2021-11-18 NOTE — Progress Notes (Signed)
Remote pacemaker transmission.   

## 2021-12-16 ENCOUNTER — Telehealth: Payer: Self-pay | Admitting: Family Medicine

## 2021-12-16 NOTE — Telephone Encounter (Signed)
Please set up transfer of care visit when possible.  Thanks.

## 2021-12-16 NOTE — Telephone Encounter (Signed)
Patient called in and was wanting to know if Dr. Para March would take her on as new patient. She stated her sister Clearence Cheek ([birth year]/26/1944) sees Dr. Para March and she has heard great things about him. Please advise. Thank you!

## 2021-12-17 NOTE — Telephone Encounter (Signed)
Spoke to pt, schedule pt for 12/28/21

## 2021-12-28 ENCOUNTER — Encounter: Payer: Self-pay | Admitting: Family Medicine

## 2021-12-28 ENCOUNTER — Ambulatory Visit (INDEPENDENT_AMBULATORY_CARE_PROVIDER_SITE_OTHER): Payer: Medicare HMO | Admitting: Family Medicine

## 2021-12-28 VITALS — BP 120/72 | HR 78 | Temp 97.9°F | Ht 64.5 in | Wt 137.0 lb

## 2021-12-28 DIAGNOSIS — I10 Essential (primary) hypertension: Secondary | ICD-10-CM

## 2021-12-28 DIAGNOSIS — Z23 Encounter for immunization: Secondary | ICD-10-CM

## 2021-12-28 DIAGNOSIS — Z7189 Other specified counseling: Secondary | ICD-10-CM | POA: Diagnosis not present

## 2021-12-28 DIAGNOSIS — H209 Unspecified iridocyclitis: Secondary | ICD-10-CM | POA: Insufficient documentation

## 2021-12-28 DIAGNOSIS — M810 Age-related osteoporosis without current pathological fracture: Secondary | ICD-10-CM | POA: Diagnosis not present

## 2021-12-28 DIAGNOSIS — J45909 Unspecified asthma, uncomplicated: Secondary | ICD-10-CM | POA: Diagnosis not present

## 2021-12-28 DIAGNOSIS — H35039 Hypertensive retinopathy, unspecified eye: Secondary | ICD-10-CM | POA: Insufficient documentation

## 2021-12-28 DIAGNOSIS — E785 Hyperlipidemia, unspecified: Secondary | ICD-10-CM

## 2021-12-28 DIAGNOSIS — K219 Gastro-esophageal reflux disease without esophagitis: Secondary | ICD-10-CM | POA: Insufficient documentation

## 2021-12-28 DIAGNOSIS — H409 Unspecified glaucoma: Secondary | ICD-10-CM | POA: Insufficient documentation

## 2021-12-28 DIAGNOSIS — H43819 Vitreous degeneration, unspecified eye: Secondary | ICD-10-CM | POA: Insufficient documentation

## 2021-12-28 LAB — BASIC METABOLIC PANEL
BUN: 14 mg/dL (ref 6–23)
CO2: 28 mEq/L (ref 19–32)
Calcium: 10 mg/dL (ref 8.4–10.5)
Chloride: 102 mEq/L (ref 96–112)
Creatinine, Ser: 0.62 mg/dL (ref 0.40–1.20)
GFR: 85.37 mL/min (ref >60.00)
Glucose, Bld: 95 mg/dL (ref 70–99)
Potassium: 4.1 mEq/L (ref 3.5–5.1)
Sodium: 138 mEq/L (ref 135–145)

## 2021-12-28 LAB — LIPID PANEL
Cholesterol: 140 mg/dL (ref 0–200)
HDL: 69.9 mg/dL (ref >39.00)
LDL Cholesterol: 62 mg/dL (ref 0–99)
NonHDL: 69.63
Total CHOL/HDL Ratio: 2
Triglycerides: 40 mg/dL (ref 0.0–149.0)
VLDL: 8 mg/dL (ref 0.0–40.0)

## 2021-12-28 LAB — VITAMIN D 25 HYDROXY (VIT D DEFICIENCY, FRACTURES): VITD: 65.68 ng/mL (ref 30.00–100.00)

## 2021-12-28 MED ORDER — BUDESONIDE-FORMOTEROL FUMARATE 160-4.5 MCG/ACT IN AERO: 2.0000 | INHALATION_SPRAY | Freq: Two times a day (BID) | RESPIRATORY_TRACT | 5 refills | Status: DC

## 2021-12-28 NOTE — Progress Notes (Unsigned)
New patient to est care.    Her brother in law was critically ill but was able to get home. He has improved in the meantime.   Hypertension:    Using medication without problems or lightheadedness: yes Chest pain with exertion: yes Edema:no Short of breath: due to h/o asthma, see below.    Elevated Cholesterol: Using medications without problems: yes Muscle aches: no Diet compliance: d/w pt.   Exercise: d/w pt.    Asthma.  H/o eczema per patient report.  She has prev allergy eval.  She has been off symbicort but felt better on med.  Has used SABA prn.  Discussed restart of Symbicort.  S/p pacer placement.  Per cards.    Osteoporosis.  On vit D replacement. Has been on fosamax for ~ 5 years.  Requesting records about previous bone density testing.  She was on calcitriol, has been off for a few weeks.  Discussed rechecking her labs today before we make plans about restarting calcitriol.  Glaucoma per eye clinic.  I will defer.  She agrees.  She has levaquin allergy.  She wasn't sure about sulfa allergy.  Discussed.  Living will d/w pt.  If incapacitated, she would have son [employer].    Meds, vitals, and allergies reviewed.   ROS: Per HPI unless specifically indicated in ROS section   GEN: nad, alert and oriented HEENT: ncat NECK: supple w/o LA CV: rrr. PULM: ctab, no inc wob ABD: soft, +bs EXT: no edema SKIN: no acute rash  35 minutes were devoted to patient care in this encounter (this includes time spent reviewing the patient's file/history, interviewing and examining the patient, counseling/reviewing plan with patient).

## 2021-12-28 NOTE — Patient Instructions (Addendum)
Go to the lab on the way out.   If you have mychart we'll likely use that to update you.    Take care.  Glad to see you. Restart symbicort and see if you need albuterol less.   Please ask the front for a record release from your previous clinic, to get the records from the last two years.

## 2021-12-30 DIAGNOSIS — Z7189 Other specified counseling: Secondary | ICD-10-CM | POA: Insufficient documentation

## 2021-12-30 DIAGNOSIS — M810 Age-related osteoporosis without current pathological fracture: Secondary | ICD-10-CM | POA: Insufficient documentation

## 2021-12-30 DIAGNOSIS — M8580 Other specified disorders of bone density and structure, unspecified site: Secondary | ICD-10-CM | POA: Insufficient documentation

## 2021-12-30 DIAGNOSIS — E785 Hyperlipidemia, unspecified: Secondary | ICD-10-CM | POA: Insufficient documentation

## 2021-12-30 NOTE — Assessment & Plan Note (Signed)
See notes on labs.  Continue valsartan and amlodipine.

## 2021-12-30 NOTE — Assessment & Plan Note (Signed)
Living will d/w pt.  If incapacitated, she would have son [employer].

## 2021-12-30 NOTE — Assessment & Plan Note (Addendum)
Continue montelukast.  Discussed restarting Symbicort with as needed albuterol use.  Rationale for that plan discussed with patient.  Routine cautions given to patient.  She will update me as needed.

## 2021-12-30 NOTE — Assessment & Plan Note (Signed)
Continue atorvastatin.  See notes on labs. 

## 2021-12-30 NOTE — Assessment & Plan Note (Signed)
Continue alendronate for now.  She reports being on for approximately 5 years.  Will review outside bone density report when available.  See notes on labs.

## 2022-01-05 ENCOUNTER — Telehealth: Payer: Self-pay | Admitting: Family Medicine

## 2022-01-05 NOTE — Telephone Encounter (Signed)
Pt called to get status of her results. Told pt Duncan's response to lab work. Pt asked could some x-rays be done here at our office of her knee? Or does she need to visit a orthopedic office? Call back # [phone]

## 2022-01-07 NOTE — Telephone Encounter (Signed)
Called and spoke with patient and she no longer wants to have xray done right now. She has started back doing her exercises that her ortho doctor showed her to do and that is helping. She states that if she feels like she needs one done again then she will call back but right now she is good.

## 2022-01-11 ENCOUNTER — Telehealth: Payer: Self-pay | Admitting: Family Medicine

## 2022-01-11 NOTE — Telephone Encounter (Signed)
Called and spoke with patient about rx she is requesting. Patient states she was given Clotrimazole 10 mg tablets to take for thrush. She is currently having a flare and would like refill. I advised patient that she may need to be seen before a prescription is sent in but would let her know either way.

## 2022-01-11 NOTE — Telephone Encounter (Signed)
  Encourage patient to contact the pharmacy for refills or they can request refills through [facility]  Did the patient contact the pharmacy: No  LAST APPOINTMENT DATE:  12/28/2021  NEXT APPOINTMENT DATE: N/A  MEDICATION: Clotrimazole 10 MG (was prescribed by her old provider)  Is the patient out of medication? No  If not, how much is left? 2 left  PHARMACY: [facility] [address] [place], [place] - [address] AT [facility] [address]   Let patient know to contact pharmacy at the end of the day to make sure medication is ready.  Please notify patient to allow 48-72 hours to process

## 2022-01-12 MED ORDER — CLOTRIMAZOLE 10 MG MT TROC: 10.0000 mg | Freq: Every day | OROMUCOSAL | 0 refills | Status: DC

## 2022-01-12 NOTE — Telephone Encounter (Signed)
Patient noitified rx was sent and advised to get rechecked if this does not help.

## 2022-01-12 NOTE — Telephone Encounter (Signed)
Sent.  Needs recheck if not better with rx.  Thanks.

## 2022-01-12 NOTE — Addendum Note (Signed)
Addended by: Joaquim Nam on: 01/12/2022 08:01 AM   Modules accepted: Orders

## 2022-01-23 ENCOUNTER — Telehealth: Payer: Self-pay | Admitting: Cardiology

## 2022-01-23 NOTE — Telephone Encounter (Signed)
Patient called the answering service to notify [place] that she will be traveling today to [place] so her pacemaker monitor will be unplugged during that time. Estimates that the device will be unplugged for about 4 hours. She will plug the monitor back in when she arrives to her destination.   Jonita Albee, PA-C 01/23/2022 8:59 AM

## 2022-02-02 ENCOUNTER — Ambulatory Visit (INDEPENDENT_AMBULATORY_CARE_PROVIDER_SITE_OTHER): Payer: Medicare HMO

## 2022-02-02 DIAGNOSIS — I441 Atrioventricular block, second degree: Secondary | ICD-10-CM

## 2022-02-03 LAB — CUP PACEART REMOTE DEVICE CHECK
Battery Remaining Longevity: 107 mo
Battery Remaining Percentage: 95.5 %
Battery Voltage: 3.01 V
Brady Statistic AP VP Percent: 37 %
Brady Statistic AP VS Percent: 1.1 %
Brady Statistic AS VP Percent: 59 %
Brady Statistic AS VS Percent: 2.1 %
Brady Statistic RA Percent Paced: 37 %
Brady Statistic RV Percent Paced: 96 %
Date Time Interrogation Session: 20231226020016
Implantable Lead Connection Status: 753985
Implantable Lead Connection Status: 753985
Implantable Lead Implant Date: 20230623
Implantable Lead Implant Date: 20230623
Implantable Lead Location: 753859
Implantable Lead Location: 753860
Implantable Pulse Generator Implant Date: 20230623
Lead Channel Impedance Value: 430 Ohm
Lead Channel Impedance Value: 430 Ohm
Lead Channel Pacing Threshold Amplitude: 0.625 V
Lead Channel Pacing Threshold Amplitude: 1.5 V
Lead Channel Pacing Threshold Pulse Width: 0.5 ms
Lead Channel Pacing Threshold Pulse Width: 0.5 ms
Lead Channel Sensing Intrinsic Amplitude: 11.4 mV
Lead Channel Sensing Intrinsic Amplitude: 5 mV
Lead Channel Setting Pacing Amplitude: 1.625
Lead Channel Setting Pacing Amplitude: 1.75 V
Lead Channel Setting Pacing Pulse Width: 0.5 ms
Lead Channel Setting Sensing Sensitivity: 2 mV
Pulse Gen Model: 2272
Pulse Gen Serial Number: 8092211

## 2022-02-08 ENCOUNTER — Telehealth: Payer: Self-pay | Admitting: Family Medicine

## 2022-02-08 NOTE — Telephone Encounter (Signed)
Please update patient.  I see in her records that she had DXA done 10/16/20.  I do not see the actual report of the bone density test, just the notation that she had it done.  If she is able to get a copy of the actual report that would be helpful.  Otherwise I think it makes sense to repeat it in September 2024 as that would be a 2-year follow-up.  I put a reminder in the EMR.  Thanks.

## 2022-02-09 NOTE — Telephone Encounter (Signed)
Spoke with patient about dexa scan. She is going to track down the most recent records. Patient thinks she had this done sooner then 9/22. She will find out and have report sent.

## 2022-02-10 NOTE — Progress Notes (Deleted)
FOLLOW UP Date of Service/Encounter:  02/10/22   Subjective:  [name] (DOB: 05-27-1945) is a 77 y.o. female who [facility] in re-evaluation of the following:  asthma, chronic rhinitis  History obtained from: chart review and {Persons; PED relatives w/patient:19415::"patient"}.  For Review, LV was on 11/09/21  with Dr.Hanadi Stanly seen for routine follow-up.  We diagnosed he with blepharitis and nonallergic conjunctivitis, advised her to see care with ophthalmologist.  We also encouraged her to seek care from ENT for hx of chronic sinusitis and previous sinus surgery.  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  Has been on Symbicort for many years.  Never hospitalized due to asthma.  Following up with a pulmonologist for asthma management.  -Spirometry showed moderate obstructive disease with significant bronchodilator response (73% ratio, FEV1 57% on pre and 65% on post) Chronic Rhinitis: moved from [place] [facility] about one year ago due to unexpected loss of husband. Has itchy eyes since March of 2023, using steroid eye drops and pataday without relief. Has not been evaluated by ophtalmologist.  Clarinex has not been helpful for her.  She was receiving [facility] at [facility] but is transferring care.  Had done a course of AIT many years ago while living in [place] Hx of sinus surgery years ago. Follows with ENT for chronic sinusitis.  - SPT and IDs on 11/09/21 negative Dermatitis red itchy rash on her face, following up with dermatology.  Has history of eczema and rosacea treated in the past with mometasone, Elocon, metronidazole gel.  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 Since last visit, evaluated by ENT on 01/20/22 LV for chronic sinusitis treated with ciprofloxacin.   Allergies as of 02/12/2022       Reactions   Azithromycin    Other  reaction(s): gi distress, GI intolerance, Other   Levofloxacin Hives, Rash   Sulfa Antibiotics Hives   Red patches on legs years ago         Medication List        Accurate as of February 10, 2022 10:01 AM. If you have any questions, ask your nurse or doctor.          acetaminophen 650 MG CR tablet Commonly known as: TYLENOL Take 650 mg by mouth every 8 (eight) hours as needed for pain.   albuterol 108 (90 Base) MCG/ACT inhaler Commonly known as: VENTOLIN HFA Inhale 2 puffs into the lungs every 6 (six) hours as needed for wheezing or shortness of breath.   alendronate 70 MG tablet Commonly known as: FOSAMAX Take 70 mg by mouth every Friday.   amLODipine 10 MG tablet Commonly known as: NORVASC Take 10 mg by mouth daily.   ascorbic acid 500 MG tablet Commonly known as: VITAMIN C Take 500 mg by mouth daily.   atorvastatin 20 MG tablet Commonly known as: LIPITOR Take 20 mg by mouth daily.   Azelastine HCl 137 MCG/SPRAY Soln Place 2 sprays into both nostrils 2 (two) times daily.   budesonide-formoterol 160-4.5 MCG/ACT inhaler Commonly known as: SYMBICORT Inhale 2 puffs into the lungs 2 (two) times daily. Rinse after use.   cetirizine 10 MG tablet Commonly known as: ZyrTEC Allergy Take 1 tablet (10 mg total) by mouth daily.   Cod Liver Oil Caps Take 1 capsule by mouth daily.   cyanocobalamin 1000 MCG tablet Commonly known as:  VITAMIN B12 Take 1,000 mcg by mouth daily.   dicyclomine 20 MG tablet Commonly known as: BENTYL Take 1 tablet (20 mg total) by mouth in the morning, at noon, and at bedtime.   dorzolamide 2 % ophthalmic solution Commonly known as: TRUSOPT SMARTSIG:In Eye(s)   fluticasone 50 MCG/ACT nasal spray Commonly known as: FLONASE Place 2 sprays into both nostrils 2 (two) times daily as needed for allergies or rhinitis.   GARLIC PO Take 1 tablet by mouth every other day.   hydrocortisone 2.5 % cream Apply 1 Application topically 2 (two)  times daily as needed (itching).   ketoconazole 2 % shampoo Commonly known as: NIZORAL Apply topically.   latanoprost 0.005 % ophthalmic solution Commonly known as: XALATAN Place 1 drop into both eyes at bedtime.   montelukast 10 MG tablet Commonly known as: SINGULAIR Take 1 tablet (10 mg total) by mouth daily.   multivitamin tablet Take 1 tablet by mouth daily.   pantoprazole 40 MG tablet Commonly known as: PROTONIX Take 40 mg by mouth daily.   prednisoLONE acetate 1 % ophthalmic suspension Commonly known as: PRED FORTE Place 1 drop into both eyes 3 (three) times daily.   PRESCRIPTION MEDICATION Take 5 tablets by mouth daily as needed (to prevent thrush). Clotriamazole troche 10mg  Tablet   triamcinolone ointment 0.1 % Commonly known as: KENALOG Apply 1 Application topically 2 (two) times daily.   valsartan 160 MG tablet Commonly known as: DIOVAN Take 160 mg by mouth 2 (two) times daily.   Vitamin D (Ergocalciferol) 1.25 MG (50000 UNIT) Caps capsule Commonly known as: DRISDOL Take 50,000 Units by mouth every Wednesday.   Zinc 50 MG Caps Take 1 capsule by mouth daily.       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  ROS: All others negative except as noted per HPI.    Objective:  There were no vitals taken for this visit. There is no height or weight on file to calculate BMI. Physical Exam: General Appearance:  Alert, cooperative, no distress, appears stated age  Head:  Normocephalic, without obvious abnormality, atraumatic  Eyes:  Conjunctiva clear, EOM's intact  Nose: Nares normal, {Blank multiple:19196:a:"***","hypertrophic turbinates","normal mucosa","no visible anterior polyps","septum midline"}  Throat: Lips, tongue normal; teeth and gums normal, {Blank multiple:19196:a:"***","normal posterior oropharynx","tonsils 2+","tonsils 3+","no tonsillar exudate","+ cobblestoning"}  Neck: Supple, symmetrical  Lungs:   {Blank multiple:19196:a:"***","clear to auscultation bilaterally","end-expiratory wheezing","wheezing throughout"}, Respirations unlabored, {Blank multiple:19196:a:"***","no coughing","intermittent dry coughing"}  Heart:  {Blank multiple:19196:a:"***","regular rate and rhythm","no murmur"}, Appears well perfused  Extremities: No edema  Skin: Skin color, texture, turgor normal, no rashes or lesions on visualized portions of skin  Neurologic: No gross deficits   Reviewed: ***  Spirometry:  Tracings reviewed. Her effort: {Blank single:19197::"Good reproducible efforts.","It was hard to get consistent  efforts and there is a question as to whether this reflects a maximal maneuver.","Poor effort, data can not be interpreted.","Variable effort-results affected.","decent for first attempt at spirometry."} FVC: ***L FEV1: ***L, ***% predicted FEV1/FVC ratio: ***% Interpretation: {Blank single:19197::"Spirometry consistent with mild obstructive disease","Spirometry consistent with moderate obstructive disease","Spirometry consistent with severe obstructive disease","Spirometry consistent with possible restrictive disease","Spirometry consistent with mixed obstructive and restrictive disease","Spirometry uninterpretable due to technique","Spirometry  consistent with normal pattern","No overt abnormalities noted given today's efforts"}.  Please see scanned spirometry results for details.  Skin Testing: {Blank single:19197::"Select foods","Environmental allergy panel","Environmental allergy panel and select foods","Food allergy panel","None","Deferred due to recent antihistamines use","deferred due to recent reaction"}. ***Adequate positive and negative controls Results discussed with patient/family.   {Blank single:19197::"Allergy testing results were read and interpreted by myself, documented by clinical staff."," "}  Assessment/Plan   ***  Sigurd Sos, MD  [facility]

## 2022-02-11 ENCOUNTER — Ambulatory Visit: Payer: Medicare HMO | Admitting: Internal Medicine

## 2022-02-12 ENCOUNTER — Ambulatory Visit: Payer: Medicare HMO | Admitting: Internal Medicine

## 2022-02-18 ENCOUNTER — Ambulatory Visit (INDEPENDENT_AMBULATORY_CARE_PROVIDER_SITE_OTHER): Payer: Medicare Other | Admitting: Family Medicine

## 2022-02-18 ENCOUNTER — Encounter: Payer: Self-pay | Admitting: Family Medicine

## 2022-02-18 ENCOUNTER — Telehealth: Payer: Self-pay

## 2022-02-18 VITALS — BP 136/64 | HR 54 | Temp 97.9°F | Ht 64.5 in | Wt 135.0 lb

## 2022-02-18 DIAGNOSIS — J329 Chronic sinusitis, unspecified: Secondary | ICD-10-CM

## 2022-02-18 DIAGNOSIS — M8580 Other specified disorders of bone density and structure, unspecified site: Secondary | ICD-10-CM

## 2022-02-18 DIAGNOSIS — E2839 Other primary ovarian failure: Secondary | ICD-10-CM | POA: Diagnosis not present

## 2022-02-18 MED ORDER — CIPROFLOXACIN HCL 500 MG PO TABS: 500.0000 mg | ORAL_TABLET | Freq: Two times a day (BID) | ORAL | 0 refills | Status: DC

## 2022-02-18 NOTE — Patient Instructions (Addendum)
Let us know if you don't get a call about scheduling your bone density test.  See if you can tell how long you have been on fosamax and let me know.  Restart cipro.  Stop if you have any rash or swelling.  I'll update ENT.   Take care.  Glad to see you.

## 2022-02-18 NOTE — Telephone Encounter (Signed)
[place] Night - Client Nonclinical Telephone Record  [facility] Client [facility] Night - Client Client Site [facility] - Night Provider Renford Dills - MD Contact Type Call Who Is Calling Patient / Member / Family / Caregiver Caller Name [place] Phone Number [phone] Patient Name [name] Patient DOB 04/17/1945 Call Type Message Only Information Provided Reason for Call Request for General Office Information Initial Comment She is running late for her appt at 8am, but is on way now. Disp. Time Disposition Final User 02/18/2022 7:54:03 AM General Information Provided Yes Donato Heinz Call Closed By: Donato Heinz Transaction Date/Time: 02/18/2022 7:51:35 AM (ET  Per chart review pt has already been seen with appt with Dr Damita Dunnings; nothing further needed.

## 2022-02-18 NOTE — Progress Notes (Signed)
URI symptoms  duration of symptoms: months, since ~10/23.   rhinorrhea: yes congestion:yes ear pain: sore throat: cough: yes, presumed from post nasal gtt.   myalgias: Prev green sputum, some this AM.    ========================= Prev ENT note d/w pt.   Prev culture at that time grew out Pseudomonas   Impression & Plans:  1) chronic sinusitis 2) history of endoscopic sinus surgery 3) chronic nonallergic rhinitis  Culture and sensitivity results were reviewed with Dr. Wilburn Cornelia. History of rash with Levaquin discussed he recommended a trial of ciprofloxacin. Discontinue immediately if any rash noted. Patient also cautioned to stop tizanidine while on medication. If unable to take ciprofloxacin may have to give consideration to tobramycin irrigating solution which will require prior approval. Could also consider infectious disease consult.   ========================= She got some better with prev cipro use but sx didn't resolve.  She tolerated med.  Allergy history discussed with patient.  D/w pt about fosamax use and prev DXA dating.  She is going to check on her duration of fosamax use.  See AVS.    Per HPI unless specifically indicated in ROS section   Meds, vitals, and allergies reviewed.   GEN: nad, alert and oriented HEENT: mucous membranes moist, TM w/o erythema, nasal epithelium injected, OP with cobblestoning, R>L max tender NECK: supple w/o LA CV: rrr. PULM: ctab, no inc wob ABD: soft, +bs EXT: no edema

## 2022-02-19 ENCOUNTER — Telehealth: Payer: Self-pay | Admitting: Family Medicine

## 2022-02-19 DIAGNOSIS — J45909 Unspecified asthma, uncomplicated: Secondary | ICD-10-CM

## 2022-02-19 NOTE — Telephone Encounter (Signed)
Called patient and she would like to see if she can get a referral to see pulmonology. She was previously followed by pulmonology 5-6 years ago. She would like to re-establish with a pulmonologist.

## 2022-02-19 NOTE — Telephone Encounter (Signed)
Pt called in requesting a call back want to discuss a referral with Pulmonary and ENT . Please advise # [phone]

## 2022-02-21 ENCOUNTER — Telehealth: Payer: Self-pay | Admitting: Family Medicine

## 2022-02-21 DIAGNOSIS — J329 Chronic sinusitis, unspecified: Secondary | ICD-10-CM | POA: Insufficient documentation

## 2022-02-21 NOTE — Assessment & Plan Note (Signed)
She got some better with prev cipro use but sx didn't resolve.  She tolerated med.  Allergy history discussed with patient. Restart cipro.  Stop if you have any rash or swelling.  I'll update ENT.

## 2022-02-21 NOTE — Telephone Encounter (Signed)
I put in the referral.  Thanks.  

## 2022-02-21 NOTE — Telephone Encounter (Signed)
Please send a copy of her most recent office visit note to:  [facility]  [place], [place] [id number]  [phone]

## 2022-02-21 NOTE — Assessment & Plan Note (Signed)
D/w pt about fosamax use and prev DXA dating.  She is going to check on her duration of fosamax use.  See AVS.

## 2022-02-22 ENCOUNTER — Other Ambulatory Visit: Payer: Self-pay | Admitting: Family Medicine

## 2022-02-22 NOTE — Telephone Encounter (Signed)
Paperwork printed and put in mail to be sent out.

## 2022-02-22 NOTE — Telephone Encounter (Signed)
Left message to return call to our office.  

## 2022-02-23 NOTE — Telephone Encounter (Signed)
Refill request for CLOTRIMAZOLE 10 MG TROCHE   LOV - 02/18/22 Next OV - not scheduled Last refill - 01/12/22 #70/0

## 2022-02-24 ENCOUNTER — Telehealth: Payer: Self-pay | Admitting: Family Medicine

## 2022-02-24 DIAGNOSIS — J329 Chronic sinusitis, unspecified: Secondary | ICD-10-CM

## 2022-02-24 NOTE — Telephone Encounter (Signed)
Patient called and stated the medication ciprofloxacin (CIPRO) 500 MG tablet  is giving red pimples on legs and she wanted to know what her next steps are and she was told to call back if it does. Call back number [phone].

## 2022-02-25 DIAGNOSIS — H401131 Primary open-angle glaucoma, bilateral, mild stage: Secondary | ICD-10-CM | POA: Diagnosis not present

## 2022-02-25 NOTE — Progress Notes (Signed)
Remote pacemaker transmission.   

## 2022-02-25 NOTE — Telephone Encounter (Signed)
Patient notified referral was done. She is going to call to schedule appt.

## 2022-02-25 NOTE — Telephone Encounter (Signed)
Stop cipro.  I listed it as an allergy.  I need her to follow up with ENT if not better.  Does she feel better in the meantime?  If any SOB or lip/tongue swelling, then needs ER eval/dial 911.  Thanks.

## 2022-02-25 NOTE — Telephone Encounter (Signed)
Advised patient to stop cipro and advised to see ENT. Patient states she needs a referral for ENT; wants to see Dr. Constance Holster.

## 2022-02-26 NOTE — Addendum Note (Signed)
Addended by: Tonia Ghent on: 02/26/2022 07:59 AM   Modules accepted: Orders

## 2022-02-26 NOTE — Telephone Encounter (Signed)
Called and informed pt that she can call and make the appointment as the referral has been put in.

## 2022-02-26 NOTE — Telephone Encounter (Signed)
Referral done.  Have her update [place] if she doesn't get a scheduled.  She should be able to go ahead and call for an appointment.  Thanks.

## 2022-03-02 NOTE — Progress Notes (Unsigned)
FOLLOW UP Date of Service/Encounter:  03/04/22   Subjective:  [name] (DOB: 21-Aug-1945) is a 77 y.o. female who returns to the [facility] on 03/04/2022 in re-evaluation of the following:  asthma, chronic rhinitis  History obtained from: chart review and patient.   For Review, LV was on 11/09/21  with Dr.Iam Lipson seen for routine follow-up.  We diagnosed he with blepharitis and nonallergic conjunctivitis, advised her to see care with ophthalmologist.  We also encouraged her to seek care from ENT for hx of chronic sinusitis and previous sinus surgery.   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  Has been on Symbicort for many years.  Never hospitalized due to asthma.  Following up with a pulmonologist for asthma management, but has not established care.  -Spirometry showed moderate obstructive disease with significant bronchodilator response (73% ratio, FEV1 57% on pre and 65% on post) Chronic Rhinitis: moved from [place] about one year ago due to unexpected loss of husband. Has itchy eyes since March of 2023, using steroid eye drops and pataday without relief. Has not been evaluated by ophtalmologist.  Clarinex has not been helpful for her.  She was receiving AIT at [facility] but is transferring care.  Had done a course of AIT many years ago while living in [place] Hx of sinus surgery years ago. Follows with ENT for chronic sinusitis. - SPT and IDs on 11/09/21 negative Dermatitis red itchy rash on her face, following up with dermatology.  Has history of eczema and rosacea treated in the past with mometasone, Elocon, metronidazole gel.   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 Since last visit, evaluated by ENT on 01/20/22 LV for chronic sinusitis treated with ciprofloxacin.  Appears she had a culture prior to this appointment which grew out pseudomonas.  She reports she is doing well. She is seeing a new eye doctor and has switched eye drops for glaucoma and this seems to have cleared up her eye symptoms. She does occasionally have a little patch of dry skin on her forehead and is using hydrocortisone which helps. Managed by dermatology. She does have an upcoming appointment to establish care with Pulmonary on February 12th.  She is using Symbicort 160 mcg 2 puffs twice a day.  She was told her oxygen was low at her primary. After this she started using her albuterol twice a day as she thought this might help.  However, she is asymptomatic when using.  She does report rinsing her mouth out with mouthwash after use of steroid inhalers. She does say her voice has been going in and out.  She has been unable to sing as she normally does.  She has been getting antibiotics for sinus infections.  Follows with ENT.  She also goes to see Dr. Constance Holster on February 12th.  She reports that at her last visit she was told there were white spots on the back of her throat.  However, no further treatment or assessment was made.   Allergies as of 03/04/2022       Reactions   Azithromycin    Other reaction(s): gi distress, GI intolerance, Other   Levofloxacin Hives, Rash   Sulfa Antibiotics Hives   Red patches on legs years ago    Ciprofloxacin    rash        Medication List  Accurate as of March 04, 2022 12:53 PM. If you have any questions, ask your nurse or doctor.          STOP taking these medications    alendronate 70 MG tablet Commonly known as: FOSAMAX Stopped by: Clemon Chambers, MD   prednisoLONE acetate 1 % ophthalmic suspension Commonly known as: PRED FORTE Stopped by: Clemon Chambers, MD       TAKE these medications    acetaminophen 650 MG CR tablet Commonly known as: TYLENOL Take 650 mg by mouth every 8 (eight) hours as needed for pain.    albuterol 108 (90 Base) MCG/ACT inhaler Commonly known as: VENTOLIN HFA Inhale 2 puffs into the lungs every 6 (six) hours as needed for wheezing or shortness of breath.   amLODipine 10 MG tablet Commonly known as: NORVASC Take 10 mg by mouth daily.   ascorbic acid 500 MG tablet Commonly known as: VITAMIN C Take 500 mg by mouth daily.   atorvastatin 20 MG tablet Commonly known as: LIPITOR Take 20 mg by mouth daily.   Azelastine HCl 137 MCG/SPRAY Soln Place 2 sprays into both nostrils 2 (two) times daily.   budesonide-formoterol 160-4.5 MCG/ACT inhaler Commonly known as: SYMBICORT Inhale 2 puffs into the lungs 2 (two) times daily. Rinse after use.   cetirizine 10 MG tablet Commonly known as: ZyrTEC Allergy Take 1 tablet (10 mg total) by mouth daily.   Cod Liver Oil Caps Take 1 capsule by mouth daily.   cyanocobalamin 1000 MCG tablet Commonly known as: VITAMIN B12 Take 1,000 mcg by mouth daily.   dicyclomine 20 MG tablet Commonly known as: BENTYL Take 1 tablet (20 mg total) by mouth in the morning, at noon, and at bedtime.   dorzolamide 2 % ophthalmic solution Commonly known as: TRUSOPT SMARTSIG:In Eye(s)   fluconazole 100 MG tablet Commonly known as: DIFLUCAN Take 2 tablets on day 1, then 1 tablet daily for the next 13 days (14 total days) Started by: Clemon Chambers, MD   fluticasone 50 MCG/ACT nasal spray Commonly known as: FLONASE Place 2 sprays into both nostrils 2 (two) times daily as needed for allergies or rhinitis.   GARLIC PO Take 1 tablet by mouth every other day.   hydrocortisone 2.5 % cream Apply 1 Application topically 2 (two) times daily as needed (itching).   ketoconazole 2 % shampoo Commonly known as: NIZORAL Apply topically.   latanoprost 0.005 % ophthalmic solution Commonly known as: XALATAN Place 1 drop into both eyes at bedtime.   montelukast 10 MG tablet Commonly known as: SINGULAIR Take 1 tablet (10 mg total) by mouth daily.    multivitamin tablet Take 1 tablet by mouth daily.   pantoprazole 40 MG tablet Commonly known as: PROTONIX Take 40 mg by mouth daily.   PRESCRIPTION MEDICATION Take 5 tablets by mouth daily as needed (to prevent thrush). Clotriamazole troche 10mg  Tablet   triamcinolone ointment 0.1 % Commonly known as: KENALOG Apply 1 Application topically 2 (two) times daily.   valsartan 160 MG tablet Commonly known as: DIOVAN Take 160 mg by mouth 2 (two) times daily.   Zinc 50 MG Caps Take 1 capsule by mouth daily.       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  ROS: All others negative except as noted per HPI.   Objective:  BP 120/60 (BP Location: Left Arm, Patient Position: Sitting, Cuff Size: Normal)   Pulse 70   Temp 97.8 F (36.6 C) (Temporal)   Resp 20   SpO2 99%  There is no height or weight on file to calculate BMI. Physical Exam: General Appearance:  Alert, cooperative, no distress, appears stated age  Head:  Normocephalic, without obvious abnormality, atraumatic  Eyes:  Conjunctiva clear, EOM's intact  Nose: Nares normal, normal mucosa  Throat: Lips, tongue normal; teeth and gums normal,  white pinpoint plaques scattered on posterior oropharynx  Neck: Supple, symmetrical  Lungs:    clear to auscultation bilaterally, Respirations unlabored, no coughing  Heart:  regular rate and rhythm and no murmur, Appears well perfused  Extremities: No edema  Skin: Skin color, texture, turgor normal, no rashes or lesions on visualized portions of skin  Neurologic: No gross deficits    Assessment/Plan  Recurrent sinusitis with concern for candidal esophagitis - previous pseudomonas + on sinus culture per ENT notes - immune screen today-unable to obtain lymph enumeration in clinic today; will postpone for next appointment, all other labs obtained - take fluconazole 200 mg (2 tablets on day 1) then 1 tablet daily for the next 13 days - follow up with ENT  Moderate Persistent Asthma:managed by Pulmonary - Controller Inhaler: Continue Symbicort 160 mcg 2 puffs twice a day; This Should Be Used Everyday - Rinse mouth out after use - Continue Singulair (Montelukast) 10mg  nightly. - Rescue Inhaler: Albuterol (Proair/Ventolin) 2 puffs . Use  every 4-6 hours as needed for chest tightness, wheezing, or coughing.  Can also use 15 minutes prior to exercise if you have symptoms with activity. - Asthma is not controlled if:  - Symptoms are occurring >2 times a week OR  - >2 times a month nighttime awakenings  - You are requiring systemic steroids (prednisone/steroid injections) more than once per year  - Your require hospitalization for your asthma.  - Please call the clinic to schedule a follow up if these symptoms arise  Chronic rhinitis-nonallergic -allergy skin testing was previously negative, intradermals also negative - Continue Astelin (Azelastine) 1-2 sprays in each nostril twice a day as needed.  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  It was a pleasure seeing you again today in clinic!  , MD  [facility]

## 2022-03-03 ENCOUNTER — Encounter: Payer: Self-pay | Admitting: Family Medicine

## 2022-03-03 NOTE — Telephone Encounter (Signed)
error 

## 2022-03-04 ENCOUNTER — Other Ambulatory Visit: Payer: Self-pay

## 2022-03-04 ENCOUNTER — Encounter: Payer: Self-pay | Admitting: Internal Medicine

## 2022-03-04 ENCOUNTER — Ambulatory Visit: Payer: Medicare Other | Admitting: Internal Medicine

## 2022-03-04 ENCOUNTER — Other Ambulatory Visit: Payer: Self-pay | Admitting: Internal Medicine

## 2022-03-04 VITALS — BP 120/60 | HR 70 | Temp 97.8°F | Resp 20

## 2022-03-04 DIAGNOSIS — J329 Chronic sinusitis, unspecified: Secondary | ICD-10-CM | POA: Diagnosis not present

## 2022-03-04 DIAGNOSIS — H1013 Acute atopic conjunctivitis, bilateral: Secondary | ICD-10-CM | POA: Diagnosis not present

## 2022-03-04 DIAGNOSIS — H10403 Unspecified chronic conjunctivitis, bilateral: Secondary | ICD-10-CM | POA: Diagnosis not present

## 2022-03-04 DIAGNOSIS — B370 Candidal stomatitis: Secondary | ICD-10-CM | POA: Diagnosis not present

## 2022-03-04 DIAGNOSIS — L309 Dermatitis, unspecified: Secondary | ICD-10-CM

## 2022-03-04 DIAGNOSIS — J4540 Moderate persistent asthma, uncomplicated: Secondary | ICD-10-CM | POA: Diagnosis not present

## 2022-03-04 DIAGNOSIS — J310 Chronic rhinitis: Secondary | ICD-10-CM | POA: Diagnosis not present

## 2022-03-04 DIAGNOSIS — J309 Allergic rhinitis, unspecified: Secondary | ICD-10-CM | POA: Diagnosis not present

## 2022-03-04 MED ORDER — FLUCONAZOLE 100 MG PO TABS: ORAL_TABLET | ORAL | 0 refills | Status: DC

## 2022-03-04 NOTE — Telephone Encounter (Signed)
Chronic rhinitis

## 2022-03-04 NOTE — Patient Instructions (Addendum)
Moderate Persistent Asthma:managed by Pulmonary - Controller Inhaler: Continue Symbicort 160 mcg 2 puffs twice a day; This Should Be Used Everyday - Rinse mouth out after use - Continue Singulair (Montelukast) 10mg  nightly. - Rescue Inhaler: Albuterol (Proair/Ventolin) 2 puffs . Use  every 4-6 hours as needed for chest tightness, wheezing, or coughing.  Can also use 15 minutes prior to exercise if you have symptoms with activity. - Asthma is not controlled if:  - Symptoms are occurring >2 times a week OR  - >2 times a month nighttime awakenings  - You are requiring systemic steroids (prednisone/steroid injections) more than once per year  - Your require hospitalization for your asthma.  - Please call the clinic to schedule a follow up if these symptoms arise  Chronic rhinitis-nonallergic -allergy skin testing was previously negative, intradermals also negative - Continue Astelin (Azelastine) 1-2 sprays in each nostril twice a day as needed.  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  It was a pleasure seeing you again today in clinic!  Sigurd Sos, MD [facility]

## 2022-03-04 NOTE — Telephone Encounter (Signed)
DX Code Needed please advise

## 2022-03-09 LAB — STREP PNEUMONIAE 23 SEROTYPES IGG
Pneumo Ab Type 1*: 0.4 ug/mL — ABNORMAL LOW (ref >1.3)
Pneumo Ab Type 12 (12F)*: <0.1 ug/mL — ABNORMAL LOW (ref >1.3)
Pneumo Ab Type 14*: 0.3 ug/mL — ABNORMAL LOW (ref >1.3)
Pneumo Ab Type 17 (17F)*: <0.1 ug/mL — ABNORMAL LOW (ref >1.3)
Pneumo Ab Type 19 (19F)*: 0.4 ug/mL — ABNORMAL LOW (ref >1.3)
Pneumo Ab Type 2*: 0.1 ug/mL — ABNORMAL LOW (ref >1.3)
Pneumo Ab Type 20*: <0.1 ug/mL — ABNORMAL LOW (ref >1.3)
Pneumo Ab Type 22 (22F)*: <0.1 ug/mL — ABNORMAL LOW (ref >1.3)
Pneumo Ab Type 23 (23F)*: <0.1 ug/mL — ABNORMAL LOW (ref >1.3)
Pneumo Ab Type 26 (6B)*: <0.1 ug/mL — ABNORMAL LOW (ref >1.3)
Pneumo Ab Type 3*: 0.4 ug/mL — ABNORMAL LOW (ref >1.3)
Pneumo Ab Type 34 (10A)*: <0.1 ug/mL — ABNORMAL LOW (ref >1.3)
Pneumo Ab Type 4*: <0.1 ug/mL — ABNORMAL LOW (ref >1.3)
Pneumo Ab Type 43 (11A)*: <0.1 ug/mL — ABNORMAL LOW (ref >1.3)
Pneumo Ab Type 5*: <0.1 ug/mL — ABNORMAL LOW (ref >1.3)
Pneumo Ab Type 51 (7F)*: <0.1 ug/mL — ABNORMAL LOW (ref >1.3)
Pneumo Ab Type 54 (15B)*: 0.4 ug/mL — ABNORMAL LOW (ref >1.3)
Pneumo Ab Type 56 (18C)*: <0.1 ug/mL — ABNORMAL LOW (ref >1.3)
Pneumo Ab Type 57 (19A)*: >33.6 ug/mL (ref >1.3)
Pneumo Ab Type 68 (9V)*: <0.1 ug/mL — ABNORMAL LOW (ref >1.3)
Pneumo Ab Type 70 (33F)*: 2.4 ug/mL (ref >1.3)
Pneumo Ab Type 8*: 0.7 ug/mL — ABNORMAL LOW (ref >1.3)
Pneumo Ab Type 9 (9N)*: <0.1 ug/mL — ABNORMAL LOW (ref >1.3)

## 2022-03-09 LAB — LYMPH ENUMERATION, BASIC & NK CELLS
% CD 3 Pos. Lymph.: 72.3 % (ref 57.5–86.2)
% CD 4 Pos. Lymph.: 60.5 % — ABNORMAL HIGH (ref 30.8–58.5)
% NK (CD56/16): 18.2 % (ref 1.4–19.4)
Ab NK (CD56/16): 400 /uL (ref 24–406)
Absolute CD 3: 1591 /uL (ref 622–2402)
Absolute CD 4 Helper: 1331 /uL (ref 359–1519)
Basophils Absolute: 0.1 10*3/uL (ref 0.0–0.2)
Basos: 1 %
CD19 % B Cell: 8.9 % (ref 3.3–25.4)
CD19 Abs: 196 /uL (ref 12–645)
CD4/CD8 Ratio: 4.8 — ABNORMAL HIGH (ref 0.92–3.72)
CD8 % Suppressor T Cell: 12.6 % (ref 12.0–35.5)
CD8 T Cell Abs: 277 /uL (ref 109–897)
EOS (ABSOLUTE): 0.2 10*3/uL (ref 0.0–0.4)
Eos: 2 %
Hematocrit: 36.2 % (ref 34.0–46.6)
Hemoglobin: 12.1 g/dL (ref 11.1–15.9)
Immature Grans (Abs): 0 10*3/uL (ref 0.0–0.1)
Immature Granulocytes: 0 %
Lymphocytes Absolute: 2.2 10*3/uL (ref 0.7–3.1)
Lymphs: 25 %
MCH: 30.6 pg (ref 26.6–33.0)
MCHC: 33.4 g/dL (ref 31.5–35.7)
MCV: 91 fL (ref 79–97)
Monocytes Absolute: 0.5 10*3/uL (ref 0.1–0.9)
Monocytes: 6 %
Neutrophils Absolute: 6 10*3/uL (ref 1.4–7.0)
Neutrophils: 66 %
Platelets: 262 10*3/uL (ref 150–450)
RBC: 3.96 x10E6/uL (ref 3.77–5.28)
RDW: 13.6 % (ref 11.7–15.4)
WBC: 9.1 10*3/uL (ref 3.4–10.8)

## 2022-03-09 LAB — IGG, IGA, IGM
IgA/Immunoglobulin A, Serum: 264 mg/dL (ref 64–422)
IgG (Immunoglobin G), Serum: 1874 mg/dL — ABNORMAL HIGH (ref 586–1602)
IgM (Immunoglobulin M), Srm: 179 mg/dL (ref 26–217)

## 2022-03-09 LAB — DIPHTHERIA / TETANUS ANTIBODY PANEL
Diphtheria Ab: 0.31 IU/mL (ref <0.10)
Tetanus Ab, IgG: 0.66 IU/mL (ref <0.10)

## 2022-03-09 LAB — COMPLEMENT, TOTAL: Compl, Total (CH50): >60 U/mL (ref >41)

## 2022-03-09 NOTE — Progress Notes (Signed)
Please let [name] know that so far her labs coming back are very reassuring except that she does not have adequate protection against strep pneumonia (a bacteria that can cause pneumonia, sinus infections, etc).  Can we find out when her last Pneumovax 23 vaccine was? And if has been more than 5 years, she will need to get one with her PCP (she is medicare so we are not allowed to give this).  I will recheck this lab 6 weeks after she gets revaccinated.

## 2022-03-10 ENCOUNTER — Telehealth: Payer: Self-pay | Admitting: Family Medicine

## 2022-03-10 NOTE — Telephone Encounter (Signed)
Please send the order for PNA 23 vaccine.  Thanks.

## 2022-03-10 NOTE — Telephone Encounter (Signed)
Patients allergy doctor asked her to ask Dr Damita Dunnings to write he a rx to recieve the prevnar 23 vaccination ,based on lab results that she completed at her allergy doctor last week.  WH#[phone]

## 2022-03-11 NOTE — Telephone Encounter (Signed)
Spoke with patient and advised she can get vaccine here or at her pharmacy. She requested order be sent to [facility] [address]. Order done and faxed.

## 2022-03-11 NOTE — Telephone Encounter (Signed)
Patient called back and would like to know now that the order has been sent where would she be set up at to get the  vaccine,here or her allergist?

## 2022-03-15 NOTE — Telephone Encounter (Signed)
Patient called in and stated that she is getting this vaccine done today. She was informed to let Dr. Damita Dunnings know when she gets it. Thank you!

## 2022-03-15 NOTE — Telephone Encounter (Signed)
Vaccine entered in EMR. Verified on [facility].

## 2022-03-22 DIAGNOSIS — J310 Chronic rhinitis: Secondary | ICD-10-CM | POA: Diagnosis not present

## 2022-03-22 DIAGNOSIS — J329 Chronic sinusitis, unspecified: Secondary | ICD-10-CM | POA: Diagnosis not present

## 2022-03-29 ENCOUNTER — Institutional Professional Consult (permissible substitution): Payer: Medicare Other | Admitting: Student

## 2022-03-29 DIAGNOSIS — J3489 Other specified disorders of nose and nasal sinuses: Secondary | ICD-10-CM | POA: Diagnosis not present

## 2022-03-29 DIAGNOSIS — J310 Chronic rhinitis: Secondary | ICD-10-CM | POA: Diagnosis not present

## 2022-03-29 DIAGNOSIS — J329 Chronic sinusitis, unspecified: Secondary | ICD-10-CM | POA: Diagnosis not present

## 2022-04-02 NOTE — Progress Notes (Unsigned)
Synopsis: Referred for asthma by Tonia Ghent, MD  Subjective:   PATIENT ID: [name] GENDER: female DOB: 1945-03-05, MRN: [id number]  No chief complaint on file.  76yF with history of HTN, OP, bradycardia sp PPM, CRS following with ENT, asthma on symbicort 160 and singulair previously seen by [facility] pulmonary, never smoker referred for asthma   At last visit with allergy continued on symbicort 160, singulair, astelin, flonase, given course of fluconazole for OP candidiasis  Seen by ENT 2/12, CT sinuses looked ok.   Otherwise pertinent review of systems is negative.  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 St Jude Medical Assurity MRI dual-chamber pacemaker  08/01/2021   Symptomatic bradycardia 08/01/2021     Family History  Problem Relation Age of Onset   Stroke Mother    Hypertension Mother    Breast cancer Sister    Hypertension Other    Colon cancer Neg Hx      Past Surgical History:  Procedure Laterality Date   CATARACT EXTRACTION     NASAL SINUS SURGERY     PACEMAKER IMPLANT N/A 07/31/2021   Procedure: PACEMAKER IMPLANT;  Surgeon: Constance Haw, MD;  Location: [place] [facility];  Service: Cardiovascular;  Laterality: N/A;   TUBAL LIGATION      Social History   Socioeconomic History   Marital status: Widowed    Spouse name: Not on file   Number of children: Not on file   Years of education: Not on file   Highest education level: Not on file  Occupational History   Not on file  Tobacco Use   Smoking status: Never   Smokeless tobacco: Not on file  Vaping Use   Vaping Use: Never used  Substance and Sexual Activity   Alcohol use: No   Drug use: No   Sexual activity: Not Currently  Other Topics Concern   Not on file  Social History Narrative   Widowed 2022.      Lived in [place] for 50 years.     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   Social Determinants of Health   Financial Resource Strain: Not on file  Food Insecurity: Not on file  Transportation Needs: Not on file  Physical Activity: Not on file  Stress: Not on file  Social Connections: Not on file  Intimate Partner Violence: Not on file     Allergies  Allergen Reactions   Azithromycin     Other reaction(s): gi distress, GI intolerance, Other   Levofloxacin Hives and Rash   Sulfa Antibiotics Hives    Red patches on legs years ago    Ciprofloxacin     rash     Outpatient Medications Prior to Visit  Medication Sig Dispense Refill   acetaminophen (TYLENOL) 650 MG CR tablet Take 650 mg by mouth every 8 (eight) hours as needed for pain.     albuterol (PROVENTIL HFA;VENTOLIN HFA) 108 (90 BASE) MCG/ACT inhaler Inhale 2 puffs into the lungs every 6 (six) hours as needed for wheezing or shortness of breath.     amLODipine (NORVASC) 10 MG tablet Take 10 mg by mouth daily.     atorvastatin (LIPITOR) 20 MG tablet Take 20 mg by mouth daily.     Azelastine HCl 137 MCG/SPRAY SOLN  USE 2 SPRAYS TWICE A DAY 30 mL 5   budesonide-formoterol (SYMBICORT) 160-4.5 MCG/ACT inhaler Inhale 2 puffs into the lungs 2 (two) times daily. Rinse after use. 1 each 5   cetirizine (ZYRTEC ALLERGY) 10 MG tablet Take 1 tablet (10 mg total) by mouth daily. 30 tablet 5   Cod Liver Oil CAPS Take 1 capsule by mouth daily.     dicyclomine (BENTYL) 20 MG tablet Take 1 tablet (20 mg total) by mouth in the morning, at noon, and at bedtime. 90 tablet 0   dorzolamide (TRUSOPT) 2 % ophthalmic solution SMARTSIG:In Eye(s)     fluconazole (DIFLUCAN) 100 MG tablet Take 2 tablets on day 1, then 1 tablet daily for the next 13 days (14 total days) 15 tablet 0   fluticasone (FLONASE) 50 MCG/ACT nasal spray Place 2 sprays into both nostrils 2 (two) times daily as needed for allergies or rhinitis. 16 g 5   GARLIC  PO Take 1 tablet by mouth every other day.     hydrocortisone 2.5 % cream Apply 1 Application topically 2 (two) times daily as needed (itching).     ketoconazole (NIZORAL) 2 % shampoo Apply topically.     latanoprost (XALATAN) 0.005 % ophthalmic solution Place 1 drop into both eyes at bedtime.     montelukast (SINGULAIR) 10 MG tablet Take 1 tablet (10 mg total) by mouth daily. 30 tablet 5   Multiple Vitamin (MULTIVITAMIN) tablet Take 1 tablet by mouth daily.     pantoprazole (PROTONIX) 40 MG tablet Take 40 mg by mouth daily.     PRESCRIPTION MEDICATION Take 5 tablets by mouth daily as needed (to prevent thrush). Clotriamazole troche '10mg'$  Tablet     triamcinolone ointment (KENALOG) 0.1 % Apply 1 Application topically 2 (two) times daily. (Patient not taking: Reported on 03/04/2022)     valsartan (DIOVAN) 160 MG tablet Take 160 mg by mouth 2 (two) times daily.     vitamin B-12 (CYANOCOBALAMIN) 1000 MCG tablet Take 1,000 mcg by mouth daily.     vitamin C (ASCORBIC ACID) 500 MG tablet Take 500 mg by mouth daily.     Zinc 50 MG CAPS Take 1 capsule by mouth daily.     No facility-administered medications prior to visit.       Objective:   Physical Exam:  General appearance: 77 y.o., female, NAD, conversant  Eyes: anicteric sclerae; PERRL, tracking appropriately HENT: NCAT; MMM Neck: Trachea midline; no lymphadenopathy, no JVD Lungs: CTAB, no crackles, no wheeze, with normal respiratory effort CV: RRR, no murmur  Abdomen: Soft, non-tender; non-distended, BS present  Extremities: No peripheral edema, warm Skin: Normal turgor and texture; no rash Psych: Appropriate affect Neuro: Alert and oriented to person and place, no focal deficit     There were no vitals filed for this visit.   on *** LPM *** RA BMI Readings from Last 3 Encounters:  02/18/22 22.81 kg/m  12/28/21 23.15 kg/m  11/09/21 22.71 kg/m   Wt Readings from Last 3 Encounters:  02/18/22 135 lb (61.2 kg)  12/28/21 137  lb (62.1 kg)  11/09/21 134 lb 6.4 oz (61 kg)     CBC    Component Value Date/Time   WBC 9.1 03/04/2022 1246   WBC 8.3 08/01/2021 0546   RBC 3.96 03/04/2022 1246   RBC 4.05 08/01/2021 0546   HGB 12.1 03/04/2022 1246   HCT 36.2 03/04/2022 1246   PLT 262 03/04/2022 1246   MCV 91 03/04/2022 1246   MCH 30.6  03/04/2022 1246   MCH 30.1 08/01/2021 0546   MCHC 33.4 03/04/2022 1246   MCHC 33.4 08/01/2021 0546   RDW 13.6 03/04/2022 1246   LYMPHSABS 2.2 03/04/2022 1246   MONOABS 0.8 08/01/2021 0546   EOSABS 0.2 03/04/2022 1246   BASOSABS 0.1 03/04/2022 1246    Eos 100-300  Chest Imaging: 04/02/20 CTA Chest reviewed by me with bronchial wall thickening, a few foci of scar vs distal mucus plugging LLL predominantly, 2m RUL nodule  CXR 08/01/21 unremarkable  Pulmonary Functions Testing Results:     No data to display         Donna Harman10/2/23 moderate obstruction  FeNO: ***  Pathology: ***  Echocardiogram: ***  Heart Catheterization: ***    Assessment & Plan:    Plan:      NMaryjane Hurter MD [facility] 04/02/2022 5:21 PM

## 2022-04-05 ENCOUNTER — Encounter: Payer: Self-pay | Admitting: Student

## 2022-04-05 ENCOUNTER — Ambulatory Visit: Payer: Medicare Other | Admitting: Student

## 2022-04-05 VITALS — BP 134/80 | HR 74 | Temp 97.8°F | Ht 64.5 in | Wt 138.0 lb

## 2022-04-05 DIAGNOSIS — B370 Candidal stomatitis: Secondary | ICD-10-CM | POA: Diagnosis not present

## 2022-04-05 DIAGNOSIS — J4540 Moderate persistent asthma, uncomplicated: Secondary | ICD-10-CM | POA: Diagnosis not present

## 2022-04-05 LAB — CBC WITH DIFFERENTIAL/PLATELET
Basophils Absolute: 0.1 10*3/uL (ref 0.0–0.1)
Basophils Relative: 0.8 % (ref 0.0–3.0)
Eosinophils Absolute: 0.2 10*3/uL (ref 0.0–0.7)
Eosinophils Relative: 1.9 % (ref 0.0–5.0)
HCT: 36.9 % (ref 36.0–46.0)
Hemoglobin: 12.6 g/dL (ref 12.0–15.0)
Lymphocytes Relative: 23.8 % (ref 12.0–46.0)
Lymphs Abs: 1.9 10*3/uL (ref 0.7–4.0)
MCHC: 34.1 g/dL (ref 30.0–36.0)
MCV: 91.6 fl (ref 78.0–100.0)
Monocytes Absolute: 0.5 10*3/uL (ref 0.1–1.0)
Monocytes Relative: 6.6 % (ref 3.0–12.0)
Neutro Abs: 5.3 10*3/uL (ref 1.4–7.7)
Neutrophils Relative %: 66.9 % (ref 43.0–77.0)
Platelets: 297 10*3/uL (ref 150.0–400.0)
RBC: 4.03 Mil/uL (ref 3.87–5.11)
RDW: 15.1 % (ref 11.5–15.5)
WBC: 7.9 10*3/uL (ref 4.0–10.5)

## 2022-04-05 MED ORDER — FLUCONAZOLE 100 MG PO TABS: 200.0000 mg | ORAL_TABLET | Freq: Every day | ORAL | 0 refills | Status: AC

## 2022-04-05 MED ORDER — STIOLTO RESPIMAT 2.5-2.5 MCG/ACT IN AERS: 2.0000 | INHALATION_SPRAY | Freq: Every day | RESPIRATORY_TRACT | 0 refills | Status: DC

## 2022-04-05 NOTE — Patient Instructions (Addendum)
-   labs today - stop symbicort while you are on fluconazole - start fluconazole 200 mg (two tablets) once daily for 2 weeks for thrush - Would try stiolto 2 puffs daily while you are on fluconazole. Stop symbicort while you're trying out stiolto. When you do resume symbicort, you need to rinse your mouth, brush your tongue and teeth after each use.  - when you have chest congestion, would use mucinex 600 mg -1200 mg twice daily, would try flutter valve 10 slow but firm puffs twice daily after each use of stiolto or symbicort - albuterol 1-2 puffs as needed and can use 5-20 minutes before heavy exertion - continue singulair 10 mg nightly, flonase, astelin

## 2022-04-06 LAB — IGE: IgE (Immunoglobulin E), Serum: <2 kU/L (ref <=114)

## 2022-04-07 DIAGNOSIS — L259 Unspecified contact dermatitis, unspecified cause: Secondary | ICD-10-CM | POA: Diagnosis not present

## 2022-04-09 LAB — ASPERGILLUS IGE PANEL
A. Amstel/Glaucu Class Interp: 0
A. Flavus Class Interp: 0
A. Fumigatus Class Interp: 0
A. Nidulans Class Interp: 0
A. Niger Class Interp: 0
A. Versicolor Class Interp: 0
Aspergillus amstel/glaucu IgE*: <0.35 kU/L (ref <0.35)
Aspergillus flavus IgE: <0.35 kU/L (ref <0.35)
Aspergillus fumigatus IgE: <0.1 kU/L (ref <0.35)
Aspergillus nidulans IgE: <0.35 kU/L (ref <0.35)
Aspergillus niger IgE: <0.1 kU/L (ref <0.35)
Aspergillus versicolor IgE: <0.1 kU/L (ref <0.35)

## 2022-05-03 ENCOUNTER — Encounter: Payer: Self-pay | Admitting: Family Medicine

## 2022-05-03 ENCOUNTER — Ambulatory Visit (INDEPENDENT_AMBULATORY_CARE_PROVIDER_SITE_OTHER): Payer: Medicare Other | Admitting: Family Medicine

## 2022-05-03 VITALS — BP 130/72 | HR 53 | Temp 97.9°F | Ht 64.5 in | Wt 138.0 lb

## 2022-05-03 DIAGNOSIS — K589 Irritable bowel syndrome without diarrhea: Secondary | ICD-10-CM

## 2022-05-03 MED ORDER — AMLODIPINE BESYLATE 10 MG PO TABS: 10.0000 mg | ORAL_TABLET | Freq: Every day | ORAL | 3 refills | Status: DC

## 2022-05-03 NOTE — Progress Notes (Unsigned)
Discussed dealing with the stress of dealing with the paperwork related to her husband's death, grieving that loss.  She is more anxious.  Son has legal troubles in [place], discussed.  She is episodically tearful.  She is still trying to be up and active, restarted with silver sneakers.  GI sx d/w pt- h/o IBS and sig stressors noted.  No SI/HI.  Abd discomfort episodically.  H/o constipation.  She is trying to manage sx with diet modification.  Still taking dicyclomine TID at baseline.  Baking soda helped with GI sx/indigestion.  D/w pt about FODMAP diet.    R shoulder pain.  Not bothersome now.  Some discomfort with int rotation, not ext rotation.    L groin pain, with getting out of bed in the AM.  No pain usually with walking unless right after getting up.     She needed refill on amlodipine, rx sent.    Meds, vitals, and allergies reviewed.   ROS: Per HPI unless specifically indicated in ROS section

## 2022-05-03 NOTE — Patient Instructions (Addendum)
I would try the FODMAP diet first.  Let me know how that goes and if you keep having troubles.   Take care.  Glad to see you.   ====================================   What can I eat and drink on a low-FODMAP diet? Below are some examples of foods that are low in FODMAPs.  ?Grains - White rice, brown rice, quinoa, cornmeal, and oats. Choose grains that are marked certified gluten-free.  ?Dairy products - Lactose-free dairy products like almond milk and oat milk. Certain cheeses like cheddar, [place], brie, mozzarella, parmesan, and feta.  ?Meats and proteins - Lean, tender, well-cooked meats, chicken, and fish. Eggs, tempeh, tofu, peanuts, pecans, macadamia nuts, walnuts, pine nuts, pumpkin seed, sesame seeds, sunflower seeds, and nut butters made with these nuts.  ?Fruits - Grapes, oranges, strawberries, blueberries, raspberries, pineapple, honeydew melon, cantaloupe, kiwi, limes, and starfruit.  ?Vegetables - Zucchini, cucumbers, eggplant, squash, potatoes, celery, carrots, bell peppers, bean sprouts, radishes, kale, spinach, bok choy, green beans, and lettuce.  ?Other foods - Canola and olive oils and dark chocolate. Use maple syrup, brown sugar, stevia, or table sugar as sweeteners.        What foods and drinks should I avoid on a low-FODMAP diet? Below are some examples of foods that are high in FODMAPs. Avoid these foods.  ?Grains to avoid - Wheat, barley, rye, and products made with these grains such as bread, cereal, biscuits, and crackers.  ?Dairy products to avoid - Dairy products with lactose like milk and milk products like custard, yogurt, buttermilk, soft cheese, whipped cream, cottage cheese, ice cream, and soy milk.  ?Meats and proteins to avoid - Pistachios, cashews, and nut butters made from these nuts. Hummus, dried peas, lentils, beans, and other legumes.  ?Fruits to avoid - Apples, pears, mangoes, cherries, apricots, nectarines, plums, watermelon, peaches,  blackberries, prunes, avocados, and fruit juices made with these fruits.  ?Vegetables to avoid - Onions, leeks, garlic, shallots, asparagus, sugar snap peas, mushrooms, cauliflower, artichokes, and green peas.  ?Other foods to avoid - High-fructose corn syrup, agave nectar, honey, chai and chamomile tea, soda, sports drinks, milk chocolate. Low-calorie sweeteners such as xylitol, which is found in sugar-free gum and mints. Added fibers in the form of chicory root or inulin.

## 2022-05-04 ENCOUNTER — Ambulatory Visit (INDEPENDENT_AMBULATORY_CARE_PROVIDER_SITE_OTHER): Payer: Medicare Other

## 2022-05-04 DIAGNOSIS — I441 Atrioventricular block, second degree: Secondary | ICD-10-CM | POA: Diagnosis not present

## 2022-05-05 NOTE — Assessment & Plan Note (Signed)
Likely exacerbated by multiple stressors and discussed options.  Reasonable to try FODMAP diet in the meantime and handout given to patient.  She can start with that and then let me know how she is doing.  She can update me as needed.  Okay for outpatient follow-up.

## 2022-05-06 LAB — CUP PACEART REMOTE DEVICE CHECK
Battery Remaining Longevity: 103 mo
Battery Remaining Percentage: 95 %
Battery Voltage: 3.01 V
Brady Statistic AP VP Percent: 34 %
Brady Statistic AP VS Percent: 2.3 %
Brady Statistic AS VP Percent: 59 %
Brady Statistic AS VS Percent: 5.2 %
Brady Statistic RA Percent Paced: 35 %
Brady Statistic RV Percent Paced: 92 %
Date Time Interrogation Session: 20240328121341
Implantable Lead Connection Status: 753985
Implantable Lead Connection Status: 753985
Implantable Lead Implant Date: 20230623
Implantable Lead Implant Date: 20230623
Implantable Lead Location: 753859
Implantable Lead Location: 753860
Implantable Pulse Generator Implant Date: 20230623
Lead Channel Impedance Value: 430 Ohm
Lead Channel Impedance Value: 430 Ohm
Lead Channel Pacing Threshold Amplitude: 0.625 V
Lead Channel Pacing Threshold Amplitude: 1.25 V
Lead Channel Pacing Threshold Pulse Width: 0.5 ms
Lead Channel Pacing Threshold Pulse Width: 0.5 ms
Lead Channel Sensing Intrinsic Amplitude: 12 mV
Lead Channel Sensing Intrinsic Amplitude: 4.5 mV
Lead Channel Setting Pacing Amplitude: 1.5 V
Lead Channel Setting Pacing Amplitude: 1.625
Lead Channel Setting Pacing Pulse Width: 0.5 ms
Lead Channel Setting Sensing Sensitivity: 2 mV
Pulse Gen Model: 2272
Pulse Gen Serial Number: 8092211

## 2022-05-11 ENCOUNTER — Telehealth: Payer: Self-pay | Admitting: Student

## 2022-05-11 DIAGNOSIS — J4540 Moderate persistent asthma, uncomplicated: Secondary | ICD-10-CM | POA: Diagnosis not present

## 2022-05-11 MED ORDER — STIOLTO RESPIMAT 2.5-2.5 MCG/ACT IN AERS: 2.0000 | INHALATION_SPRAY | Freq: Every day | RESPIRATORY_TRACT | 11 refills | Status: DC

## 2022-05-11 NOTE — Telephone Encounter (Signed)
Spoke with the pt  She unfortunately did not receive her flutter valve we ordered through DME at last visit  We were able to give her one today- instructed on use and verbalized understanding  She reports that the Stiolto was helping with her breathing and requested rx be sent to pharm  I have sent this to preferred pharm  Nothing further needed

## 2022-05-11 NOTE — Telephone Encounter (Signed)
Pt. Came in office to get Flutter valve and a prescription for Tiotropium Bromide-Olodaterol (STIOLTO RESPIMAT)  she was only given samples on last visit and made follow up apt to see Dr.

## 2022-05-30 NOTE — Progress Notes (Unsigned)
Synopsis: Referred for asthma by Joaquim Nam, MD  Subjective:   PATIENT ID: [name] GENDER: female DOB: 21-Aug-1945, MRN: [id number]  No chief complaint on file.  77yF with history of HTN, OP, bradycardia sp PPM, CRS following with ENT, asthma on symbicort 160 and singulair previously seen by [facility] pulmonary, never smoker referred for asthma  At last visit with allergy continued on symbicort 160, singulair, astelin, flonase, given course of fluconazole for OP candidiasis  Seen by ENT 2/12, CT sinuses looked ok.   She says that Dr. Para March suggested that she establish with pulmonary. She says she has chest congestion with rattling, a little wheezing, horrible sinus congestion and postnasal drainage. No fever. Last antibiotic for her sinuses was cipro she thinks about a month or 6 weeks ago.   She takes flonase - most days, astelin prn, singulair. She says she does do sinus navage most days.    Takes symbicort 2 puffs twice daily - she rinses mouth and brushes teeth and tongue most of the times that she uses it.   She has not had any courses of steroids this year to her knowledge and I don't see any prescribed in Epic.   No family history of lung disease  Moved to [place] 2 ya was in [place]. 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 Never smoked, no MJ/vaping. She has no pets.   Interval HPI Last visit started on stiolto, fluconazole for thrush, airway clearance. Took her a while to get flutter valve.   PFT  Otherwise pertinent review of systems is negative.  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 [employer] MRI dual-chamber pacemaker  08/01/2021   Symptomatic bradycardia 08/01/2021     Family History  Problem Relation Age of Onset    Stroke Mother    Hypertension Mother    Breast cancer Sister    Hypertension Other    Colon cancer Neg Hx      Past Surgical History:  Procedure Laterality Date   CATARACT EXTRACTION     NASAL SINUS SURGERY     PACEMAKER IMPLANT N/A 07/31/2021   Procedure: PACEMAKER IMPLANT;  Surgeon: Regan Lemming, MD;  Location: [facility];  Service: Cardiovascular;  Laterality: N/A;   TUBAL LIGATION      Social History   Socioeconomic History   Marital status: Widowed    Spouse name: Not on file   Number of children: Not on file   Years of education: Not on file   Highest education level: Not on file  Occupational History   Not on file  Tobacco Use   Smoking status: Never    Passive exposure: Past   Smokeless tobacco: Not on file  Vaping Use   Vaping Use: Never used  Substance and Sexual Activity   Alcohol use: No   Drug use: No   Sexual activity: Not Currently  Other Topics Concern   Not on file  Social History Narrative   Widowed 2022.     Lived in [place] for 50 years.     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   Social Determinants of Health   Financial Resource Strain: Not on file  Food Insecurity: Not on file  Transportation  Needs: Not on file  Physical Activity: Not on file  Stress: Not on file  Social Connections: Not on file  Intimate Partner Violence: Not on file     Allergies  Allergen Reactions   Azithromycin     Other reaction(s): gi distress, GI intolerance, Other   Levofloxacin Hives and Rash   Sulfa Antibiotics Hives    Red patches on legs years ago    Ciprofloxacin     rash     Outpatient Medications Prior to Visit  Medication Sig Dispense Refill   acetaminophen (TYLENOL) 650 MG CR tablet Take 650 mg by mouth every 8 (eight) hours as needed for pain.     albuterol (PROVENTIL HFA;VENTOLIN HFA) 108 (90 BASE) MCG/ACT inhaler Inhale 2 puffs into the lungs every 6 (six) hours as needed for wheezing or shortness  of breath.     amLODipine (NORVASC) 10 MG tablet Take 1 tablet (10 mg total) by mouth daily. 90 tablet 3   atorvastatin (LIPITOR) 20 MG tablet Take 20 mg by mouth daily.     Azelastine HCl 137 MCG/SPRAY SOLN USE 2 SPRAYS TWICE A DAY 30 mL 5   cetirizine (ZYRTEC ALLERGY) 10 MG tablet Take 1 tablet (10 mg total) by mouth daily. 30 tablet 5   dicyclomine (BENTYL) 20 MG tablet Take 1 tablet (20 mg total) by mouth in the morning, at noon, and at bedtime. 90 tablet 0   dorzolamide (TRUSOPT) 2 % ophthalmic solution SMARTSIG:In Eye(s)     fluticasone (FLONASE) 50 MCG/ACT nasal spray Place 2 sprays into both nostrils 2 (two) times daily as needed for allergies or rhinitis. 16 g 5   GARLIC PO Take 1 tablet by mouth every other day.     hydrocortisone 2.5 % cream Apply 1 Application topically 2 (two) times daily as needed (itching).     ketoconazole (NIZORAL) 2 % shampoo Apply topically.     latanoprost (XALATAN) 0.005 % ophthalmic solution Place 1 drop into both eyes at bedtime.     montelukast (SINGULAIR) 10 MG tablet Take 1 tablet (10 mg total) by mouth daily. 30 tablet 5   Multiple Vitamin (MULTIVITAMIN) tablet Take 1 tablet by mouth daily.     pantoprazole (PROTONIX) 40 MG tablet Take 40 mg by mouth daily.     PRESCRIPTION MEDICATION Take 5 tablets by mouth daily as needed (to prevent thrush). Clotriamazole troche  Tablet     Tiotropium Bromide-Olodaterol (STIOLTO RESPIMAT) 2.5-2.5 MCG/ACT AERS Inhale 2 puffs into the lungs daily. 5.9 g 0   Tiotropium Bromide-Olodaterol (STIOLTO RESPIMAT) 2.5-2.5 MCG/ACT AERS Inhale 2 puffs into the lungs daily. 4 g 11   valsartan (DIOVAN) 160 MG tablet Take 160 mg by mouth 2 (two) times daily.     vitamin B-12 (CYANOCOBALAMIN) 1000 MCG tablet Take 1,000 mcg by mouth daily.     vitamin C (ASCORBIC ACID) 500 MG tablet Take 500 mg by mouth daily.     Zinc 50 MG CAPS Take 1 capsule by mouth daily.     No facility-administered medications prior to visit.        Objective:   Physical Exam:  General appearance: 77 y.o., female, NAD, conversant  Eyes: anicteric sclerae; PERRL, tracking appropriately HENT: NCAT; +whitish plaques over posterior oropharynx Neck: Trachea midline; no lymphadenopathy, no JVD Lungs: CTAB, no crackles, no wheeze, with normal respiratory effort CV: RRR, no murmur  Abdomen: Soft, non-tender; non-distended, BS present  Extremities: No peripheral edema, warm Skin: Normal turgor and texture; no rash  Psych: Appropriate affect Neuro: Alert and oriented to person and place, no focal deficit     There were no vitals filed for this visit.    on \\RA  BMI Readings from Last 3 Encounters:  05/03/22 23.32 kg/m  04/05/22 23.32 kg/m  02/18/22 22.81 kg/m   Wt Readings from Last 3 Encounters:  05/03/22 138 lb (62.6 kg)  04/05/22 138 lb (62.6 kg)  02/18/22 135 lb (61.2 kg)     CBC    Component Value Date/Time   WBC 7.9 04/05/2022 1024   RBC 4.03 04/05/2022 1024   HGB 12.6 04/05/2022 1024   HGB 12.1 03/04/2022 1246   HCT 36.9 04/05/2022 1024   HCT 36.2 03/04/2022 1246   PLT 297.0 04/05/2022 1024   PLT 262 03/04/2022 1246   MCV 91.6 04/05/2022 1024   MCV 91 03/04/2022 1246   MCH 30.6 03/04/2022 1246   MCH 30.1 08/01/2021 0546   MCHC 34.1 04/05/2022 1024   RDW 15.1 04/05/2022 1024   RDW 13.6 03/04/2022 1246   LYMPHSABS 1.9 04/05/2022 1024   LYMPHSABS 2.2 03/04/2022 1246   MONOABS 0.5 04/05/2022 1024   EOSABS 0.2 04/05/2022 1024   EOSABS 0.2 03/04/2022 1246   BASOSABS 0.1 04/05/2022 1024   BASOSABS 0.1 03/04/2022 1246    Eos 100-300  Chest Imaging: 04/02/20 CTA Chest reviewed by me with bronchial wall thickening, a few foci of scar vs distal mucus plugging LLL predominantly, 5mm RUL nodule  CXR 08/01/21 unremarkable  Pulmonary Functions Testing Results:     No data to display         Cleda Daub 11/09/21 moderate obstruction    Echocardiogram 07/31/21:    1. There is a dynamic mid LV  gradient ( peak gradient of 15 mmHg with  mean gradient of 7 mmHg). Left ventricular ejection fraction, by  estimation, is 60 to 65%. The left ventricle has normal function. The left  ventricle has no regional wall motion  abnormalities. Left ventricular diastolic parameters were normal.   2. Right ventricular systolic function is normal. The right ventricular  size is normal. There is normal pulmonary artery systolic pressure.   3. Left atrial size was mild to moderately dilated.   4. The mitral valve is normal in structure. Mild mitral valve  regurgitation.   5. The aortic valve is calcified. Aortic valve regurgitation is trivial.       Assessment & Plan:   # Moderate persistent asthma not in exacerbation # chronic productive cough, mucus plugging   Chronic cough may be multifactorial with a lot of reported postnasal drainage, asthma with bronchiolectasis, GERD.   # AR/CRS CT sinuses looks better than the symptoms she reports  # Oropharyngeal candidiasis Ongoing despite recent 2 week course of fluconazole  Plan: - labs today - stop symbicort while you are on fluconazole - start fluconazole 200 mg (two tablets) once daily for 2 weeks for thrush - Would try stiolto 2 puffs daily while you are on fluconazole. Stop symbicort while you're trying out stiolto. When you do resume symbicort, you need to rinse your mouth, brush your tongue and teeth after each use.  - when you have chest congestion, would use mucinex 600 mg -1200 mg twice daily, would try flutter valve 10 slow but firm puffs twice daily after each use of stiolto or symbicort - albuterol 1-2 puffs as needed and can use 5-20 minutes before heavy exertion - continue singulair 10 mg nightly, flonase, astelin - next visit discuss RUL nodule, surveillance  ct chest   RTC 3 months with PFT   Omar Person, MD [facility] 05/30/2022 6:19 PM

## 2022-05-31 ENCOUNTER — Ambulatory Visit: Payer: Medicare Other | Admitting: Student

## 2022-05-31 ENCOUNTER — Encounter: Payer: Self-pay | Admitting: Student

## 2022-05-31 VITALS — BP 128/66 | HR 70 | Temp 98.0°F | Ht 64.5 in | Wt 139.0 lb

## 2022-05-31 DIAGNOSIS — M130 Polyarthritis, unspecified: Secondary | ICD-10-CM

## 2022-05-31 DIAGNOSIS — R053 Chronic cough: Secondary | ICD-10-CM | POA: Diagnosis not present

## 2022-05-31 DIAGNOSIS — R911 Solitary pulmonary nodule: Secondary | ICD-10-CM

## 2022-05-31 MED ORDER — FLUTICASONE PROPIONATE 50 MCG/ACT NA SUSP: 2.0000 | Freq: Every day | NASAL | 11 refills | Status: DC

## 2022-05-31 MED ORDER — ALBUTEROL SULFATE HFA 108 (90 BASE) MCG/ACT IN AERS: 2.0000 | INHALATION_SPRAY | Freq: Four times a day (QID) | RESPIRATORY_TRACT | 11 refills | Status: DC | PRN

## 2022-05-31 NOTE — Patient Instructions (Signed)
-   Continue stiolto 2 puffs daily, would remain off symbicort - when you have chest congestion, would use mucinex 600 mg -1200 mg twice daily, would try flutter valve 10 slow but firm puffs twice daily after each use of stiolto or symbicort - albuterol 1-2 puffs as needed and can use 5-20 minutes before heavy exertion - try to use your flonase 1-2 sprays each nostril daily, continue singulair 10 mg nightly, astelin, antihistamine

## 2022-06-07 ENCOUNTER — Encounter: Payer: Self-pay | Admitting: Internal Medicine

## 2022-06-07 ENCOUNTER — Ambulatory Visit: Payer: Medicare Other | Admitting: Internal Medicine

## 2022-06-07 ENCOUNTER — Other Ambulatory Visit: Payer: Self-pay

## 2022-06-07 VITALS — BP 116/72 | HR 61 | Temp 97.9°F | Resp 18 | Wt 137.1 lb

## 2022-06-07 DIAGNOSIS — J329 Chronic sinusitis, unspecified: Secondary | ICD-10-CM

## 2022-06-07 DIAGNOSIS — J4540 Moderate persistent asthma, uncomplicated: Secondary | ICD-10-CM

## 2022-06-07 DIAGNOSIS — J310 Chronic rhinitis: Secondary | ICD-10-CM | POA: Diagnosis not present

## 2022-06-07 DIAGNOSIS — L309 Dermatitis, unspecified: Secondary | ICD-10-CM | POA: Diagnosis not present

## 2022-06-07 DIAGNOSIS — H10403 Unspecified chronic conjunctivitis, bilateral: Secondary | ICD-10-CM

## 2022-06-07 MED ORDER — IPRATROPIUM BROMIDE 0.06 % NA SOLN: 2.0000 | Freq: Four times a day (QID) | NASAL | 12 refills | Status: DC

## 2022-06-07 NOTE — Patient Instructions (Addendum)
Moderate Persistent Asthma: managed by Pulmonary - Controller Inhaler: Continue Stiolto 2 puffs once a day; This Should Be Used Everyday - Rinse mouth out after use - Continue Singulair (Montelukast) 10mg  nightly. - Rescue Inhaler: Albuterol (Proair/Ventolin) 2 puffs . Use  every 4-6 hours as needed for chest tightness, wheezing, or coughing.  Can also use 15 minutes prior to exercise if you have symptoms with activity. - Asthma is not controlled if:  - Symptoms are occurring >2 times a week OR  - >2 times a month nighttime awakenings  - You are requiring systemic steroids (prednisone/steroid injections) more than once per year  - Your require hospitalization for your asthma.  - Please call the clinic to schedule a follow up if these symptoms arise - Get Chest CT ordered by pulmonary  - Continue flutter valve as prescribed by pulmonary   Chronic rhinitis-nonallergic -allergy skin testing was previously negative, intradermals also negative - Continue Astelin (Azelastine) 1-2 sprays in each nostril twice a day as needed.  You may use this as needed for nasal congestion/itchy ears/itchy nose if desired - continue Flonase 1-2 sprays, each nostril daily as needed - Continue sinus rinses daily  Dont add budesonide to sinus rinses -  Start Atrovent (ipatopium) nasal spray 1-2 sprays in each nostril up to three times daily AS NEEDED for POST NASAL DRIP/RUNNY NOSE/DRAINAGE.  If you become too dry, use less often. 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  - Continue to follow up with ENT  Follow up:  6 months  Thank you so much for letting me partake in your care today.  Don't  hesitate to reach out if you have any additional concerns!  Ferol Luz, MD  [facility] [place], [place]

## 2022-06-07 NOTE — Progress Notes (Signed)
Follow Up Note  RE: [name] MRN: [id number] DOB: 06/30/1945 Date of Office Visit: 06/07/2022  Referring provider: Joaquim Nam, MD Primary care provider: Joaquim Nam, MD  Chief Complaint: Follow-up, Cough, and Sinus Problem  History of Present Illness: I had the pleasure of seeing [name] for a follow up visit at the [facility] on 06/07/2022. She is a 77 y.o. female, who is being followed for asthma, chronic rhinitis, chronic sinusitis. Her previous allergy office visit was on 03/04/22 with Dr. Maurine Minister. Today is a regular follow up visit.  History obtained from patient, chart review.  At last visit there was concern for recurrent sinusitis and candidal esophagitis.  Previously had Pseudomonas on sinus culture per ENT notes.  She was started on fluconazole for 13 days and immune workup was done which showed an adequate strep pneumococcal titers.  Lymph enumeration was not able to be drawn in clinic at that time.  She was seen by ENT on 2/12, with clear sinus CT.  She was transition to [employer] 2 puffs daily due to oral candidasis  and referred to rheumatology for polyarthritis.  CT chest ordered for pulmonary nodule (all done by pulm on 05/31/22)  Today she reports  Since switching to stiolto and flutter valve which has helped cough.   Still complaining of productive cough and sinus pressure.  For coughing spasm hot water and help alleviate symptoms  She is moved montelukast in the morning and zyrtec at night  She is using astelin and nasacort twice a day  She has add mucinex 600-1200mg  twice a day for drainage which helps. However drainage irritates her throat which can effect her singing.  She also been adding budesonide to her sinus rinses twice daily.  This was previously prescribed by a physician in [place] and she had had leftovers. Dermatitis is well-controlled.    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  Has been on Symbicort for many years.  Never hospitalized due to asthma.  Following up with a pulmonologist for asthma management, but has not established care.  -Spirometry showed moderate obstructive disease with significant bronchodilator response (73% ratio, FEV1 57% on pre and 65% on post) Chronic Rhinitis: moved from [place] about one year ago due to unexpected loss of husband. Has itchy eyes since March of 2023, using steroid eye drops and pataday without relief. Has not been evaluated by ophtalmologist.  Clarinex has not been helpful for her.  She was receiving [facility] at [facility] but is transferring care.  Had done a course of AIT many years ago while living in [place] Hx of sinus surgery years ago. Follows with ENT for chronic sinusitis. - SPT and IDs on 11/09/21 negative Dermatitis red itchy rash on her face, following up with dermatology.  Has history of eczema and rosacea treated in the past with mometasone, Elocon, metronidazole gel.   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 female with: Moderate persistent asthma without complication  Nonallergic rhinitis  Facial dermatitis  Chronic conjunctivitis of both eyes, unspecified chronic conjunctivitis type  Recurrent sinusitis   Plan: Patient Instructions  Moderate Persistent Asthma: managed by Pulmonary - Controller Inhaler: Continue Stiolto 2 puffs once a day; This Should Be Used Everyday - Rinse mouth out after use - Continue Singulair (Montelukast)  10mg  nightly. - Rescue Inhaler: Albuterol (Proair/Ventolin) 2 puffs . Use  every 4-6 hours as needed for chest tightness, wheezing, or coughing.  Can also use 15 minutes prior to exercise if you have symptoms with activity. - Asthma is not controlled if:  - Symptoms are occurring >2 times a week OR  - >2 times a month nighttime  awakenings  - You are requiring systemic steroids (prednisone/steroid injections) more than once per year  - Your require hospitalization for your asthma.  - Please call the clinic to schedule a follow up if these symptoms arise - Get Chest CT ordered by pulmonary  - Continue flutter valve as prescribed by pulmonary   Chronic rhinitis-nonallergic -allergy skin testing was previously negative, intradermals also negative - Continue Astelin (Azelastine) 1-2 sprays in each nostril twice a day as needed.  You may use this as needed for nasal congestion/itchy ears/itchy nose if desired - continue Flonase 1-2 sprays, each nostril daily as needed - Continue sinus rinses daily  Dont add budesonide to sinus rinses -  Start Atrovent (ipatopium) nasal spray 1-2 sprays in each nostril up to three times daily AS NEEDED for POST NASAL DRIP/RUNNY NOSE/DRAINAGE.  If you become too dry, use less often. 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  - Continue to follow up with ENT  Follow up:  6 months  Thank you so much for letting me partake in your care today.  Don't hesitate to reach out if you have any additional concerns!  Ferol Luz, MD  [facility] [place] [place]      Meds ordered this encounter  Medications   ipratropium (ATROVENT) 0.06 % nasal spray    Sig: Place 2 sprays into both nostrils 4 (four) times daily.    Dispense:  15 mL    Refill:  12    Lab Orders  No laboratory test(s) ordered today   Diagnostics: None done   Medication List:  Current Outpatient Medications  Medication Sig Dispense Refill   ipratropium (ATROVENT) 0.06 % nasal  spray Place 2 sprays into both nostrils 4 (four) times daily. 15 mL 12   acetaminophen (TYLENOL) 650 MG CR tablet Take 650 mg by mouth every 8 (eight) hours as needed for pain.     albuterol (VENTOLIN HFA) 108 (90 Base) MCG/ACT inhaler Inhale 2 puffs into the lungs every 6 (six) hours as needed for wheezing or shortness of breath. 8 g 11   amLODipine (NORVASC) 10 MG tablet Take 1 tablet (10 mg total) by mouth daily. 90 tablet 3   atorvastatin (LIPITOR) 20 MG tablet Take 20 mg by mouth daily.     Azelastine HCl 137 MCG/SPRAY SOLN USE 2 SPRAYS TWICE A DAY 30 mL 5   cetirizine (ZYRTEC ALLERGY) 10 MG tablet Take 1 tablet (10 mg total) by mouth daily. 30 tablet 5   Chlorphen-Pseudoephed-APAP (CORICIDIN D PO) Take by mouth.     dicyclomine (BENTYL) 20 MG tablet Take 1 tablet (20 mg total) by mouth in the morning, at noon, and at bedtime. 90 tablet 0   dorzolamide (TRUSOPT) 2 % ophthalmic solution SMARTSIG:In Eye(s)     fluticasone (FLONASE) 50 MCG/ACT nasal spray Place  2 sprays into both nostrils daily. 16 g 11   GARLIC PO Take 1 tablet by mouth every other day.     hydrocortisone 2.5 % cream Apply 1 Application topically 2 (two) times daily as needed (itching).     ketoconazole (NIZORAL) 2 % shampoo Apply topically.     latanoprost (XALATAN) 0.005 % ophthalmic solution Place 1 drop into both eyes at bedtime.     montelukast (SINGULAIR) 10 MG tablet Take 1 tablet (10 mg total) by mouth daily. 30 tablet 5   Multiple Vitamin (MULTIVITAMIN) tablet Take 1 tablet by mouth daily.     pantoprazole (PROTONIX) 40 MG tablet Take 40 mg by mouth daily.     Tiotropium Bromide-Olodaterol (STIOLTO RESPIMAT) 2.5-2.5 MCG/ACT AERS Inhale 2 puffs into the lungs daily. 4 g 11   valsartan (DIOVAN) 160 MG tablet Take 160 mg by mouth 2 (two) times daily.     vitamin B-12 (CYANOCOBALAMIN) 1000 MCG tablet Take 1,000 mcg by mouth daily.     vitamin C (ASCORBIC ACID) 500 MG tablet Take 500 mg by mouth daily.     Zinc 50 MG  CAPS Take 1 capsule by mouth daily.     No current facility-administered medications for this visit.   Allergies: Allergies  Allergen Reactions   Azithromycin     Other reaction(s): gi distress, GI intolerance, Other   Levofloxacin Hives and Rash   Sulfa Antibiotics Hives    Red patches on legs years ago    Ciprofloxacin     rash   I reviewed her past medical history, social history, family history, and environmental history and no significant changes have been reported from her previous visit.  ROS: All others negative except as noted per HPI.   Objective: BP 116/72 (BP Location: Left Arm, Patient Position: Sitting, Cuff Size: Normal)   Pulse 61   Temp 97.9 F (36.6 C) (Temporal)   Resp 18   Wt 137 lb 1.6 oz (62.2 kg)   SpO2 99%   BMI 23.17 kg/m  Body mass index is 23.17 kg/m. General Appearance:  Alert, cooperative, no distress, appears stated age  Head:  Normocephalic, without obvious abnormality, atraumatic  Eyes:  Conjunctiva clear, EOM's intact  Nose: Nares normal,  erythematous nasal mucosa, scant clear rhinnorhea, no visible anterior polyps, and septum midline  Throat: Lips, tongue normal; teeth and gums normal, + cobblestoning  Neck: Supple, symmetrical  Lungs:   clear to auscultation bilaterally, Respirations unlabored, no coughing  Heart:  regular rate and rhythm and no murmur, Appears well perfused  Extremities: No edema  Skin: Skin color, texture, turgor normal, no rashes or lesions on visualized portions of skin  Neurologic: No gross deficits   Previous notes and tests were reviewed. The plan was reviewed with the patient/family, and all questions/concerned were addressed.  It was my pleasure to see Jilleen today and participate in her care. Please feel free to contact me with any questions or concerns.  Sincerely,  Ferol Luz, MD  Allergy & Immunology  [facility] Office: [phone]

## 2022-06-10 ENCOUNTER — Telehealth: Payer: Self-pay | Admitting: Physician Assistant

## 2022-06-10 NOTE — Telephone Encounter (Signed)
Ok to schedule first visit with me.  Please schedule new patient follow up with Dr. Corliss Skains.

## 2022-06-10 NOTE — Telephone Encounter (Signed)
[name] called to schedule a new patient appointment with Dr. Corliss Skains. After learning when new patient scheduling begins, she requested to see another provider to be seen sooner. I informed her approval would be needed by [name] before scheduling due to referral process.

## 2022-06-15 NOTE — Progress Notes (Signed)
Remote pacemaker transmission.   

## 2022-06-17 ENCOUNTER — Ambulatory Visit (HOSPITAL_COMMUNITY)
Admission: RE | Admit: 2022-06-17 | Discharge: 2022-06-17 | Disposition: A | Discharge status: Home / Self Care | Payer: Medicare Other | Source: Ambulatory Visit | Attending: Student | Admitting: Student

## 2022-06-17 DIAGNOSIS — J9809 Other diseases of bronchus, not elsewhere classified: Secondary | ICD-10-CM | POA: Diagnosis not present

## 2022-06-17 DIAGNOSIS — R911 Solitary pulmonary nodule: Secondary | ICD-10-CM | POA: Insufficient documentation

## 2022-06-22 NOTE — Progress Notes (Signed)
Laxmi Choung T. Tyesha Joffe, MD, CAQ Sports Medicine [facility] [address] [place], [address]  Phone: [phone]  FAX: [phone]  [name] - 77 y.o. female  MRN [id number]  Date of Birth: Dec 23, 1945  Date: 06/23/2022  PCP: Joaquim Nam, MD  Referral: Joaquim Nam, MD  Chief Complaint  Patient presents with   Wrist Pain    Left x 1 month   Subjective:   [name] is a 77 y.o. very pleasant female patient with Body mass index is 23.39 kg/m. who presents with the following:  Patient presents with ongoing wrist pain.  She is primarily having pain at the dorsum of the left thumb.  This is really more in the region of the wrist at the distal radius and just distal to this.  She does not have any pain in the true wrist joint, or in the bone at the distal radius or ulna.  No pain in any of the digits.  No injuries or falling at all.   May have RA - has an upcoming Rheum appointment.  Pulm scheduled an appointment     Review of Systems is noted in the HPI, as appropriate  Objective:   BP 110/70 (BP Location: Left Arm, Patient Position: Sitting, Cuff Size: Normal)   Pulse 97   Temp 97.7 F (36.5 C) (Temporal)   Ht 5' 4.5" (1.638 m)   Wt 138 lb 6 oz (62.8 kg)   SpO2 98%   BMI 23.39 kg/m   GEN: No acute distress; alert,appropriate. PULM: Breathing comfortably in no respiratory distress PSYCH: Normally interactive.    EXTR: No clubbing/cyanosis/edema Normal gait  Hand: L Ecchymosis or edema: neg ROM wrist/hand/digits/elbow: full  Carpals, MCP's, digits: NT Distal Ulna and Radius: Tender to palpation at the first dorsal compartment Supination lift test: neg Ecchymosis or edema: neg Cysts/nodules: neg Finkelstein's test: POSITIVE Snuffbox tenderness: neg Scaphoid tubercle: NT Hook of Hamate: NT Resisted supination: NT Full composite fist Grip, all digits: 5/5 str No tenosynovitis Axial load test:  neg Phalen's: neg Tinel's: neg Atrophy: neg  Hand sensation: intact   Laboratory and Imaging Data:  Assessment and Plan:     ICD-10-CM   1. De Quervain's tenosynovitis, left  M65.4      Classic tenosynovitis of the 1st dorsal compartment.  This anatomy was reviewed with the patient.  Ice bid for 2-3 days at a minimum Place the thumb in a thumb spica splint Hand exercises in a week or so - stress ball then opening hand with rubber bands  If continues to do poorly, may need to inject the 1st dorsal sheath   For now, topical Voltaren.  Dr[employer] One speech-to-text software was used for transcription in this dictation.  Possible transcriptional errors can occur using Animal nutritionist.   Signed,  Elpidio Galea. Shylin Keizer, MD   Outpatient Encounter Medications as of 06/23/2022  Medication Sig   acetaminophen (TYLENOL) 650 MG CR tablet Take 650 mg by mouth every 8 (eight) hours as needed for pain.   albuterol (VENTOLIN HFA) 108 (90 Base) MCG/ACT inhaler Inhale 2 puffs into the lungs every 6 (six) hours as needed for wheezing or shortness of breath.   amLODipine (NORVASC) 10 MG tablet Take 1 tablet (10 mg total) by mouth daily.   atorvastatin (LIPITOR) 20 MG tablet Take 20 mg by mouth daily.   Azelastine HCl 137 MCG/SPRAY SOLN USE 2 SPRAYS TWICE A DAY   cetirizine (  ZYRTEC ALLERGY) 10 MG tablet Take 1 tablet (10 mg total) by mouth daily.   Chlorphen-Pseudoephed-APAP (CORICIDIN D PO) Take by mouth.   dicyclomine (BENTYL) 20 MG tablet Take 1 tablet (20 mg total) by mouth in the morning, at noon, and at bedtime.   dorzolamide (TRUSOPT) 2 % ophthalmic solution SMARTSIG:In Eye(s)   fluticasone (FLONASE) 50 MCG/ACT nasal spray Place 2 sprays into both nostrils daily.   GARLIC PO Take 1 tablet by mouth every other day.   hydrocortisone 2.5 % cream Apply 1 Application topically 2 (two) times daily as needed (itching).   ipratropium (ATROVENT) 0.06 % nasal spray Place 2 sprays into both  nostrils 4 (four) times daily.   ketoconazole (NIZORAL) 2 % shampoo Apply topically.   latanoprost (XALATAN) 0.005 % ophthalmic solution Place 1 drop into both eyes at bedtime.   montelukast (SINGULAIR) 10 MG tablet Take 1 tablet (10 mg total) by mouth daily.   Multiple Vitamin (MULTIVITAMIN) tablet Take 1 tablet by mouth daily.   pantoprazole (PROTONIX) 40 MG tablet Take 40 mg by mouth daily.   Tiotropium Bromide-Olodaterol (STIOLTO RESPIMAT) 2.5-2.5 MCG/ACT AERS Inhale 2 puffs into the lungs daily.   vitamin B-12 (CYANOCOBALAMIN) 1000 MCG tablet Take 1,000 mcg by mouth daily.   vitamin C (ASCORBIC ACID) 500 MG tablet Take 500 mg by mouth daily.   Zinc 50 MG CAPS Take 1 capsule by mouth daily.   [DISCONTINUED] valsartan (DIOVAN) 160 MG tablet Take 160 mg by mouth 2 (two) times daily.   No facility-administered encounter medications on file as of 06/23/2022.

## 2022-06-23 ENCOUNTER — Encounter: Payer: Self-pay | Admitting: Family Medicine

## 2022-06-23 ENCOUNTER — Ambulatory Visit (INDEPENDENT_AMBULATORY_CARE_PROVIDER_SITE_OTHER): Payer: Medicare Other | Admitting: Family Medicine

## 2022-06-23 VITALS — BP 110/70 | HR 97 | Temp 97.7°F | Ht 64.5 in | Wt 138.4 lb

## 2022-06-23 DIAGNOSIS — M654 Radial styloid tenosynovitis [de Quervain]: Secondary | ICD-10-CM

## 2022-06-23 NOTE — Patient Instructions (Signed)
Voltaren 1% gel, over the counter ?You can apply up to 4 times a day ? ?This can be applied to any joint: knee, wrist, fingers, elbows, shoulders, feet and ankles. ?Can apply to any tendon: tennis elbow, achilles, tendon, rotator cuff or any other tendon. ? ?Minimal is absorbed in the bloodstream: ok with oral anti-inflammatory or a blood thinner. ? ?Cost is about 9 dollars  ?

## 2022-06-24 ENCOUNTER — Encounter: Payer: Self-pay | Admitting: Internal Medicine

## 2022-06-24 ENCOUNTER — Ambulatory Visit (INDEPENDENT_AMBULATORY_CARE_PROVIDER_SITE_OTHER): Payer: Medicare Other | Admitting: Internal Medicine

## 2022-06-24 VITALS — BP 124/62 | HR 94 | Temp 97.6°F | Ht 64.5 in | Wt 136.0 lb

## 2022-06-24 DIAGNOSIS — J0141 Acute recurrent pansinusitis: Secondary | ICD-10-CM | POA: Insufficient documentation

## 2022-06-24 MED ORDER — AMOXICILLIN-POT CLAVULANATE 875-125 MG PO TABS: 1.0000 | ORAL_TABLET | Freq: Two times a day (BID) | ORAL | 1 refills | Status: DC

## 2022-06-24 NOTE — Assessment & Plan Note (Signed)
Chronic problems  Also with lung disease and bad allergies CT last week showed bronchial thickening and findings suspicious for aspiration (but history doesn't suggest that) Will treat with augmentin 875 bid x 7 days (but also give refill just in case) Will need to review CT results with pulmonary

## 2022-06-24 NOTE — Progress Notes (Signed)
Subjective:    Patient ID: [name], female    DOB: April 29, 1945, 77 y.o.   MRN: [id number]  HPI Here due to sinus problems  Has sinus infection---tries navage Head is pounding, eye hurt Cough--worsens through the day Sees ENT for chronic sinusitis  Current infection for 2 weeks or more Drainage and post nasal drip Chest gets congested  Has stiolto for breathing--that has helped but still does use rescue inhaler. Some SOB No fever, chills or sweats Coughs up thick yellow stuff. Green nasal discharge  Current Outpatient Medications on File Prior to Visit  Medication Sig Dispense Refill   acetaminophen (TYLENOL) 650 MG CR tablet Take 650 mg by mouth every 8 (eight) hours as needed for pain.     albuterol (VENTOLIN HFA) 108 (90 Base) MCG/ACT inhaler Inhale 2 puffs into the lungs every 6 (six) hours as needed for wheezing or shortness of breath. 8 g 11   amLODipine (NORVASC) 10 MG tablet Take 1 tablet (10 mg total) by mouth daily. 90 tablet 3   atorvastatin (LIPITOR) 20 MG tablet Take 20 mg by mouth daily.     Azelastine HCl 137 MCG/SPRAY SOLN USE 2 SPRAYS TWICE A DAY 30 mL 5   cetirizine (ZYRTEC ALLERGY) 10 MG tablet Take 1 tablet (10 mg total) by mouth daily. 30 tablet 5   Chlorphen-Pseudoephed-APAP (CORICIDIN D PO) Take by mouth.     dicyclomine (BENTYL) 20 MG tablet Take 1 tablet (20 mg total) by mouth in the morning, at noon, and at bedtime. 90 tablet 0   dorzolamide (TRUSOPT) 2 % ophthalmic solution SMARTSIG:In Eye(s)     fluticasone (FLONASE) 50 MCG/ACT nasal spray Place 2 sprays into both nostrils daily. 16 g 11   GARLIC PO Take 1 tablet by mouth every other day.     hydrocortisone 2.5 % cream Apply 1 Application topically 2 (two) times daily as needed (itching).     ipratropium (ATROVENT) 0.06 % nasal spray Place 2 sprays into both nostrils 4 (four) times daily. 15 mL 12   ketoconazole (NIZORAL) 2 % shampoo Apply topically.     latanoprost (XALATAN) 0.005 % ophthalmic  solution Place 1 drop into both eyes at bedtime.     montelukast (SINGULAIR) 10 MG tablet Take 1 tablet (10 mg total) by mouth daily. 30 tablet 5   Multiple Vitamin (MULTIVITAMIN) tablet Take 1 tablet by mouth daily.     pantoprazole (PROTONIX) 40 MG tablet Take 40 mg by mouth daily.     Tiotropium Bromide-Olodaterol (STIOLTO RESPIMAT) 2.5-2.5 MCG/ACT AERS Inhale 2 puffs into the lungs daily. 4 g 11   vitamin B-12 (CYANOCOBALAMIN) 1000 MCG tablet Take 1,000 mcg by mouth daily.     vitamin C (ASCORBIC ACID) 500 MG tablet Take 500 mg by mouth daily.     Zinc 50 MG CAPS Take 1 capsule by mouth daily.     No current facility-administered medications on file prior to visit.    Allergies  Allergen Reactions   Azithromycin     Other reaction(s): gi distress, GI intolerance, Other   Levofloxacin Hives and Rash   Sulfa Antibiotics Hives    Red patches on legs years ago    Ciprofloxacin     rash    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 [facility]  [employer] MRI dual-chamber pacemaker  08/01/2021   Symptomatic bradycardia 08/01/2021    Past Surgical History:  Procedure Laterality Date   CATARACT EXTRACTION     NASAL SINUS SURGERY     PACEMAKER IMPLANT N/A 07/31/2021   Procedure: PACEMAKER IMPLANT;  Surgeon: Regan Lemming, MD;  Location: [facility] INVASIVE CV [facility];  Service: Cardiovascular;  Laterality: N/A;   TUBAL LIGATION      Family History  Problem Relation Age of Onset   Stroke Mother    Hypertension Mother    Breast cancer Sister    Hypertension Other    Colon cancer Neg Hx     Social History   Socioeconomic History   Marital status: Widowed    Spouse name: Not on file   Number of children: Not on file   Years of education: Not on file   Highest education level: Some college, no degree   Occupational History   Not on file  Tobacco Use   Smoking status: Never    Passive exposure: Past   Smokeless tobacco: Not on file  Vaping Use   Vaping Use: Never used  Substance and Sexual Activity   Alcohol use: No   Drug use: No   Sexual activity: Not Currently  Other Topics Concern   Not on file  Social History Narrative   Widowed 2022.     Lived in [place] for 50 years.     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   Social Determinants of Health   Financial Resource Strain: Low Risk  (06/22/2022)   Overall Financial Resource Strain (CARDIA)    Difficulty of Paying Living Expenses: Not hard at all  Food Insecurity: No Food Insecurity (06/22/2022)   Hunger Vital Sign    Worried About Running Out of Food in the Last Year: Never true    Ran Out of Food in the Last Year: Never true  Transportation Needs: No Transportation Needs (06/22/2022)   PRAPARE - Administrator, Civil Service (Medical): No    Lack of Transportation (Non-Medical): No  Physical Activity: Insufficiently Active (06/22/2022)   Exercise Vital Sign    Days of Exercise per Week: 3 days    Minutes of Exercise per Session: 40 min  Stress: Stress Concern Present (06/22/2022)   [employer] of Occupational Health - Occupational Stress Questionnaire    Feeling of Stress : To some extent  Social Connections: Moderately Integrated (06/22/2022)   Social Connection and Isolation Panel [NHANES]    Frequency of Communication with Friends and Family: Not on file    Frequency of Social Gatherings with Friends and Family: More than three times a week    Attends Religious Services: More than 4 times per year    Active Member of [employer] or Organizations: Yes    Attends Banker Meetings: More than 4 times per year    Marital Status: Widowed  Catering manager Violence: Not on file   Review of Systems Some nausea but no vomiting Eating okay    Objective:   Physical  Exam Constitutional:      Appearance: Normal appearance.  HENT:     Head:     Comments: Mild maxillary and frontal tenderness    Right Ear: Tympanic membrane and ear canal normal.     Left Ear: Tympanic membrane and ear canal normal.     Nose: Congestion present.     Mouth/Throat:     Pharynx: No oropharyngeal exudate  or posterior oropharyngeal erythema.  Pulmonary:     Effort: Pulmonary effort is normal.     Breath sounds: No rales.     Comments: Not tight Very slight wheeze Slightly decreased breath sounds Musculoskeletal:     Cervical back: Neck supple.  Lymphadenopathy:     Cervical: No cervical adenopathy.  Neurological:     Mental Status: She is alert.            Assessment & Plan:

## 2022-07-12 NOTE — Progress Notes (Deleted)
Office Visit Note  Patient: [name]             Date of Birth: 1945/12/01           MRN: [id number]             PCP: Joaquim Nam, MD Referring: Omar Person, MD Visit Date: 07/26/2022 Occupation: @GUAROCC @  Subjective:  No chief complaint on file.   History of Present Illness: [name] is a 77 y.o. female ***     Activities of Daily Living:  Patient reports morning stiffness for *** {minute/hour:19697}.   Patient {ACTIONS;DENIES/REPORTS:21021675::"Denies"} nocturnal pain.  Difficulty dressing/grooming: {ACTIONS;DENIES/REPORTS:21021675::"Denies"} Difficulty climbing stairs: {ACTIONS;DENIES/REPORTS:21021675::"Denies"} Difficulty getting out of chair: {ACTIONS;DENIES/REPORTS:21021675::"Denies"} Difficulty using hands for taps, buttons, cutlery, and/or writing: {ACTIONS;DENIES/REPORTS:21021675::"Denies"}  No Rheumatology ROS completed.   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   Anxiety 04/22/2020   Lumbar sprain 04/11/2017   Subluxation of patellofemoral joint 08/26/2016   Osteoarthritis of knee 08/26/2016   Migraine without status migrainosus, not intractable 10/30/2013   Asthma 01/01/2008   Depression 01/08/2003   Raynaud's syndrome without gangrene 01/26/2002   Irritable bowel syndrome 03/20/1999    Past Medical History:  Diagnosis Date   HLD (hyperlipidemia)    HTN (hypertension) 08/01/2021    IBS (irritable bowel syndrome)    Osteoporosis    Sinusitis    Status post placement of cardiac pacemaker 07/31/21 St Jude Medical Assurity MRI dual-chamber pacemaker  08/01/2021   Symptomatic bradycardia 08/01/2021    Family History  Problem Relation Age of Onset   Stroke Mother    Hypertension Mother    Breast cancer Sister    Hypertension Other    Colon cancer Neg Hx    Past Surgical History:  Procedure Laterality Date   CATARACT EXTRACTION     NASAL SINUS SURGERY     PACEMAKER IMPLANT N/A 07/31/2021   Procedure: PACEMAKER IMPLANT;  Surgeon: Regan Lemming, MD;  Location: [facility];  Service: Cardiovascular;  Laterality: N/A;   TUBAL LIGATION     Social History   Social History Narrative   Widowed 2022.     Lived in [place] for 50 years.     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   Immunization History  Administered Date(s) Administered   COVID-19, mRNA, vaccine(Comirnaty)12 years and older 11/28/2021   Fluad Quad(high Dose 65+) 12/28/2021   Influenza Split 12/23/2003, 11/21/2006, 10/20/2007, 10/21/2008   Moderna Covid-19 Vaccine Bivalent Booster 40yrs & up 01/19/2021   Moderna Sars-Covid-2 Vaccination 03/10/2019, 04/07/2019, 06/22/2020   Pneumococcal Conjugate-13 12/24/2015   Pneumococcal Polysaccharide-23 07/21/2015, 11/07/2015, 12/19/2018, 03/15/2022   Td (Adult),5 Lf Tetanus Toxid, Preservative Free 02/21/2004     Objective: Vital Signs: There were no vitals taken for this visit.   Physical Exam   Musculoskeletal Exam: ***  CDAI Exam: CDAI Score: -- Patient Global: --; Provider Global: -- Swollen: --; Tender: -- Joint Exam 07/26/2022   No joint exam has  been documented for this visit   There is currently no information documented on the homunculus. Go to the Rheumatology activity and complete the homunculus joint exam.  Investigation: No additional findings.  Imaging: CT Chest Wo Contrast  Result Date:  06/22/2022 CLINICAL DATA:  High cancer risk pulmonary nodule.  Former smoker. EXAM: CT CHEST WITHOUT CONTRAST TECHNIQUE: Multidetector CT imaging of the chest was performed following the standard protocol without IV contrast. RADIATION DOSE REDUCTION: This exam was performed according to the departmental dose-optimization program which includes automated exposure control, adjustment of the mA and/or kV according to patient size and/or use of iterative reconstruction technique. COMPARISON:  PA and lateral chest 08/01/2021, CTA chest 04/02/2020. FINDINGS: Cardiovascular: Small chronic pericardial effusion collecting to the right, unchanged. The cardiac size is normal. There are trace calcifications in the coronary arteries. There is spray artifact from a left chest power source and bipolar pacemaker wiring in the right heart. There is mild aortic tortuosity and atherosclerosis. There is no aortic aneurysm. The unenhanced great vessels are normal caliber without visible plaque. The pulmonary arteries and veins are normal in caliber. Mediastinum/Nodes: No enlarged mediastinal or axillary lymph nodes. Thyroid gland, trachea, and esophagus demonstrate no significant findings. Lungs/Pleura: There is no pleural effusion, thickening or pneumothorax. There is a calcified granuloma in the right lower lobe. There are bilateral posterior diaphragmatic fat herniations, unchanged. There is a stable 6 mm rounded noncalcified right upper lobe nodule on 6:38. This is considered benign at this point given over 2 years of stability. Continued low dose lung cancer annual screening CT is recommended if warranted per Lung-RADS guidelines. Biapical pleural-parenchymal scar-like opacities are chronically noted as well. Diffuse bronchial thickening is noted in the lower lung zones. There is scattered subsegmental bronchial and multifocal bronchiolar plugging in the basal segments of both lower lobes and in the medial base of the right  middle. There is new demonstration of patchy tree-in-bud interstitial consolidation in the right middle lobe base and in the right-greater-than-left lower lobe basal segments, findings which are concerning for aspiration pneumonitis. There is a small chronic consolidation in the lateral basal left lower lobe which was seen previously. Remaining lungs are clear. These findings are seen in the upper lobes. Upper Abdomen: There is a 1.2 cm cyst laterally in the right lobe on 2:152. This was previously larger having previously measured 2.1 cm. There is a 1.6 cm cyst in the left lobe which previously measured 1.1 cm. There is a partially visible 2.1 cm cyst medially in the left kidney. The visualized portions of this cyst are uncomplicated. There are no acute findings in the upper abdomen. Musculoskeletal: There is a severe chronic compression fracture of the T3 vertebral body with slight posteroinferior retropulsion, unchanged. A calcified central disc extrusion at T6-7 with caudal extension in the epidural space is again noted with mild encroachment on the ventral cord surface and was seen previously. There is osteopenia and mild thoracic kyphosis. Mild degenerative change of the spine noted. There is a new upper plate central compression deformity of the L2 vertebral body, without significant anterior or posterior height loss but with about 50% central height loss, but this appears chronic or at least not recent. No focal abnormality is seen in the chest wall. IMPRESSION: 1. Stable 6 mm right upper lobe nodule x2 years. Continue annual low-dose screening chest CT if warranted per Lung-RADS guidelines. 2. Diffuse bronchial thickening with scattered subsegmental bronchial and multifocal bronchiolar plugging in the basal segments of both lower  lobes and in the medial base of the right middle lobe. 3. New patchy tree-in-bud interstitial consolidation in the right middle lobe base and right-greater-than-left lower lobe  basal segments, findings concerning for aspiration pneumonitis. Aspiration precautions recommended if not already being done. 4. Aortic and coronary artery atherosclerosis. 5. Small chronic pericardial effusion. 6. Chronic severe T3 compression fracture with slight posteroinferior retropulsion. 7. New (since 2022) upper plate compression deformity of the L2 vertebral body with about 50% central height loss, but this appears chronic or at least not recent. 8. Hepatic and renal cysts. 9. Osteopenia and degenerative change. 10. Chronic calcified central disc extrusion at T6-7 with mild encroachment on the ventral cord surface. Electronically Signed   By: Almira Bar M.D.   On: 06/22/2022 00:08    Recent Labs: Lab Results  Component Value Date   WBC 7.9 04/05/2022   HGB 12.6 04/05/2022   PLT 297.0 04/05/2022   NA 138 12/28/2021   K 4.1 12/28/2021   CL 102 12/28/2021   CO2 28 12/28/2021   GLUCOSE 95 12/28/2021   BUN 14 12/28/2021   CREATININE 0.62 12/28/2021   BILITOT 0.2 (L) 04/03/2020   ALKPHOS 38 04/03/2020   AST 31 04/03/2020   ALT 32 04/03/2020   PROT 6.0 (L) 04/03/2020   ALBUMIN 3.0 (L) 04/03/2020   CALCIUM 10.0 12/28/2021   GFRAA  10/02/2006    >60        The eGFR has been calculated using the MDRD equation. This calculation has not been validated in all clinical    Speciality Comments: No specialty comments available.  Procedures:  No procedures performed Allergies: Azithromycin, Levofloxacin, Sulfa antibiotics, and Ciprofloxacin   Assessment / Plan:     Visit Diagnoses: Polyarthralgia - Polyarthritis-pannus over PIPs  Uveitis  Raynaud's syndrome without gangrene  Hx of migraines  Primary hypertension  AV block  Acute recurrent pansinusitis  History of asthma  History of IBS  History of gastroesophageal reflux (GERD)  Anxiety and depression  History of hyperlipidemia  Status post placement of cardiac pacemaker 07/31/21 St Jude Medical Assurity MRI  dual-chamber pacemaker   Orders: No orders of the defined types were placed in this encounter.  No orders of the defined types were placed in this encounter.   Face-to-face time spent with patient was *** minutes. Greater than 50% of time was spent in counseling and coordination of care.  Follow-Up Instructions: No follow-ups on file.   Gearldine Bienenstock, PA-C  Note - This record has been created using Dragon software.  Chart creation errors have been sought, but may not always  have been located. Such creation errors do not reflect on  the standard of medical care.

## 2022-07-15 ENCOUNTER — Other Ambulatory Visit: Payer: Self-pay | Admitting: Family Medicine

## 2022-07-16 ENCOUNTER — Other Ambulatory Visit: Payer: Self-pay | Admitting: Family Medicine

## 2022-07-16 DIAGNOSIS — K589 Irritable bowel syndrome without diarrhea: Secondary | ICD-10-CM

## 2022-07-16 DIAGNOSIS — K582 Mixed irritable bowel syndrome: Secondary | ICD-10-CM

## 2022-07-16 MED ORDER — VALSARTAN 160 MG PO TABS: 160.0000 mg | ORAL_TABLET | Freq: Two times a day (BID) | ORAL | 2 refills | Status: DC

## 2022-07-16 NOTE — Telephone Encounter (Signed)
Sent. Thanks.  Please verify that she was taking 160mg  twice a day.  If not, then let me know.

## 2022-07-16 NOTE — Telephone Encounter (Signed)
Patient called in to follow up on this request. Informed her that it wasn't sent in. She stated that she is needing this for her BP and it helps keep it down. Thank you!

## 2022-07-16 NOTE — Addendum Note (Signed)
Addended by: Joaquim Nam on: 07/16/2022 01:47 PM   Modules accepted: Orders

## 2022-07-16 NOTE — Telephone Encounter (Signed)
Prescription is correct.

## 2022-07-16 NOTE — Telephone Encounter (Signed)
I refused this rx request as it was not on her med list but patient states she takes it. Okay to fill?

## 2022-07-22 ENCOUNTER — Telehealth: Payer: Self-pay | Admitting: Family Medicine

## 2022-07-22 MED ORDER — AMOXICILLIN-POT CLAVULANATE 875-125 MG PO TABS: 1.0000 | ORAL_TABLET | Freq: Two times a day (BID) | ORAL | 0 refills | Status: DC

## 2022-07-22 NOTE — Telephone Encounter (Signed)
Patient called in and stated that she seen Dr. Alphonsus Sias for a sinus infection. She stated that she is still experiencing some congestion and runny nose. She was wanting to know what she should do now. Please advise. Thank you!

## 2022-07-22 NOTE — Telephone Encounter (Signed)
Called and spoke to pt. She said she did get better and has relapsed. I sent the rx as advised by Dr Alphonsus Sias.

## 2022-07-22 NOTE — Addendum Note (Signed)
Addended by: Eual Fines on: 07/22/2022 04:11 PM   Modules accepted: Orders

## 2022-07-26 ENCOUNTER — Encounter: Payer: Medicare Other | Admitting: Physician Assistant

## 2022-07-26 DIAGNOSIS — H209 Unspecified iridocyclitis: Secondary | ICD-10-CM

## 2022-07-26 DIAGNOSIS — Z8709 Personal history of other diseases of the respiratory system: Secondary | ICD-10-CM

## 2022-07-26 DIAGNOSIS — Z8669 Personal history of other diseases of the nervous system and sense organs: Secondary | ICD-10-CM

## 2022-07-26 DIAGNOSIS — M2550 Pain in unspecified joint: Secondary | ICD-10-CM

## 2022-07-26 DIAGNOSIS — I4430 Unspecified atrioventricular block: Secondary | ICD-10-CM

## 2022-07-26 DIAGNOSIS — Z8639 Personal history of other endocrine, nutritional and metabolic disease: Secondary | ICD-10-CM

## 2022-07-26 DIAGNOSIS — Z8719 Personal history of other diseases of the digestive system: Secondary | ICD-10-CM

## 2022-07-26 DIAGNOSIS — J0141 Acute recurrent pansinusitis: Secondary | ICD-10-CM

## 2022-07-26 DIAGNOSIS — F32A Depression, unspecified: Secondary | ICD-10-CM

## 2022-07-26 DIAGNOSIS — I10 Essential (primary) hypertension: Secondary | ICD-10-CM

## 2022-07-26 DIAGNOSIS — Z950 Presence of cardiac pacemaker: Secondary | ICD-10-CM

## 2022-07-26 DIAGNOSIS — I7300 Raynaud's syndrome without gangrene: Secondary | ICD-10-CM

## 2022-08-03 ENCOUNTER — Ambulatory Visit: Payer: Medicare HMO

## 2022-08-05 ENCOUNTER — Telehealth: Payer: Self-pay

## 2022-08-05 ENCOUNTER — Telehealth: Payer: Self-pay | Admitting: Family Medicine

## 2022-08-05 DIAGNOSIS — J0141 Acute recurrent pansinusitis: Secondary | ICD-10-CM

## 2022-08-05 LAB — CUP PACEART REMOTE DEVICE CHECK
Battery Remaining Longevity: 100 mo
Battery Remaining Percentage: 92 %
Battery Voltage: 2.99 V
Brady Statistic AP VP Percent: 37 %
Brady Statistic AP VS Percent: 1.6 %
Brady Statistic AS VP Percent: 56 %
Brady Statistic AS VS Percent: 3.5 %
Brady Statistic RA Percent Paced: 38 %
Brady Statistic RV Percent Paced: 94 %
Date Time Interrogation Session: 20240627153614
Implantable Lead Connection Status: 753985
Implantable Lead Connection Status: 753985
Implantable Lead Implant Date: 20230623
Implantable Lead Implant Date: 20230623
Implantable Lead Location: 753859
Implantable Lead Location: 753860
Implantable Pulse Generator Implant Date: 20230623
Lead Channel Impedance Value: 430 Ohm
Lead Channel Impedance Value: 430 Ohm
Lead Channel Pacing Threshold Amplitude: 0.625 V
Lead Channel Pacing Threshold Amplitude: 1.5 V
Lead Channel Pacing Threshold Pulse Width: 0.5 ms
Lead Channel Pacing Threshold Pulse Width: 0.5 ms
Lead Channel Sensing Intrinsic Amplitude: 12 mV
Lead Channel Sensing Intrinsic Amplitude: 3.9 mV
Lead Channel Setting Pacing Amplitude: 1.625
Lead Channel Setting Pacing Amplitude: 1.75 V
Lead Channel Setting Pacing Pulse Width: 0.5 ms
Lead Channel Setting Sensing Sensitivity: 2 mV
Pulse Gen Model: 2272
Pulse Gen Serial Number: 8092211

## 2022-08-05 NOTE — Telephone Encounter (Signed)
Pt called stating she saw Dr. Alphonsus Sias on 5/16 for sinus issues. Pt states she still has ongoing sinus issues & requested a referral to Dr. Serena Colonel - [facility] [phone] Call back # [phone]

## 2022-08-05 NOTE — Telephone Encounter (Signed)
Transmission received.

## 2022-08-06 ENCOUNTER — Encounter: Payer: Self-pay | Admitting: *Deleted

## 2022-08-06 NOTE — Telephone Encounter (Signed)
I put in the referral but if she can't be seen soon there then offer OV here.  Thanks.

## 2022-08-06 NOTE — Telephone Encounter (Signed)
Patient notified that referral was done and has OV here 08/09/22 at 12 pm.

## 2022-08-09 ENCOUNTER — Ambulatory Visit (INDEPENDENT_AMBULATORY_CARE_PROVIDER_SITE_OTHER): Payer: Medicare Other | Admitting: Family Medicine

## 2022-08-09 ENCOUNTER — Encounter: Payer: Self-pay | Admitting: Family Medicine

## 2022-08-09 VITALS — BP 118/80 | HR 79 | Temp 98.4°F | Ht 64.5 in | Wt 138.0 lb

## 2022-08-09 DIAGNOSIS — M8580 Other specified disorders of bone density and structure, unspecified site: Secondary | ICD-10-CM | POA: Diagnosis not present

## 2022-08-09 DIAGNOSIS — J0141 Acute recurrent pansinusitis: Secondary | ICD-10-CM

## 2022-08-09 DIAGNOSIS — F419 Anxiety disorder, unspecified: Secondary | ICD-10-CM

## 2022-08-09 DIAGNOSIS — L659 Nonscarring hair loss, unspecified: Secondary | ICD-10-CM

## 2022-08-09 MED ORDER — DOXYCYCLINE HYCLATE 100 MG PO TABS
100.0000 mg | ORAL_TABLET | Freq: Two times a day (BID) | ORAL | 0 refills | Status: DC
Start: 2022-08-09 — End: 2022-08-20

## 2022-08-09 MED ORDER — FLUOXETINE HCL 10 MG PO TABS
10.0000 mg | ORAL_TABLET | Freq: Every day | ORAL | 3 refills | Status: DC
Start: 2022-08-09 — End: 2023-08-26

## 2022-08-09 NOTE — Patient Instructions (Addendum)
Take care.  Glad to see you.  See if you can tell how long you have been on fosamax and let me know.   You can call for a bone density test: [facility] [address] [place] [address] [place].   I would restart prozac in the meantime.  It should take a few weeks but that should help.  Let me know if that isn't helping.   I would change to doxycycline in the meantime.  Let me know if you can't get scheduled to see ENT.

## 2022-08-09 NOTE — Progress Notes (Unsigned)
Anxiety.  Stressors with her son d/w pt.  He is in [place] and she isn't getting to see him.  Losing her hair- has seen dermatology- had seen Dr. Sharyn Lull.  Referral placed.  Worrying, tearful.    Prev was on FLUoxetine when her husband passed.  It helped.  No SI/HI.    She has less rhinorrhea today compared to prior. Done with abx in the meantime. Facial pain is better in the meantime but still with some sinus pressure. Pressure behind the TMs with sneezing.  She has ENT pending.    She had CT done but hasn't has PFTs done yet.    Meds, vitals, and allergies reviewed.   ROS: Per HPI unless specifically indicated in ROS section   L wrist in brace.       I need to check her records re: alendronate.   See if you can tell how long you have been on fosamax and let me know.   Patch of hair loss on the L posterior superior scalp.   Discolored nasal discharge with sinuses ttp x4.

## 2022-08-10 ENCOUNTER — Telehealth: Payer: Self-pay | Admitting: Family Medicine

## 2022-08-10 DIAGNOSIS — M8580 Other specified disorders of bone density and structure, unspecified site: Secondary | ICD-10-CM

## 2022-08-10 NOTE — Telephone Encounter (Signed)
Patient contacted the office with information to give Dr. Para March, stated she found the time frame in which she took the medication Fosamax. States she began taking this 10/06/2016 and stopped around March 2022. Says she took 70 mg once weekly.

## 2022-08-11 NOTE — Assessment & Plan Note (Signed)
Start doxycycline routine cautions and update me as needed.

## 2022-08-11 NOTE — Assessment & Plan Note (Signed)
Restart fluoxetine with routine cautions given to patient.  She agrees to plan.  Okay for outpatient follow-up.

## 2022-08-11 NOTE — Assessment & Plan Note (Signed)
History of.  She is going to check check to see how long she has been on alendronate.  I asked her to call about her follow-up bone density test.

## 2022-08-12 NOTE — Telephone Encounter (Signed)
I would get the bone density test done and then go from there.  Thanks for the update.  I noted it in her chart.

## 2022-08-13 ENCOUNTER — Ambulatory Visit (INDEPENDENT_AMBULATORY_CARE_PROVIDER_SITE_OTHER)
Admission: RE | Admit: 2022-08-13 | Discharge: 2022-08-13 | Disposition: A | Discharge status: Home / Self Care | Payer: Medicare Other | Source: Ambulatory Visit | Attending: Family Medicine | Admitting: Family Medicine

## 2022-08-13 ENCOUNTER — Other Ambulatory Visit: Payer: Medicare Other

## 2022-08-13 DIAGNOSIS — E2839 Other primary ovarian failure: Secondary | ICD-10-CM

## 2022-08-13 NOTE — Telephone Encounter (Signed)
Called patient and reviewed all information. Patient verbalized understanding. Will call if any further questions.  

## 2022-08-17 DIAGNOSIS — L639 Alopecia areata, unspecified: Secondary | ICD-10-CM | POA: Diagnosis not present

## 2022-08-17 DIAGNOSIS — L309 Dermatitis, unspecified: Secondary | ICD-10-CM | POA: Diagnosis not present

## 2022-08-17 NOTE — Progress Notes (Deleted)
Office Visit Note  Patient: [name]             Date of Birth: 1945-03-23           MRN: [id number]             PCP: Joaquim Nam, MD Referring: Omar Person, MD Visit Date: 08/31/2022 Occupation: @GUAROCC @  Subjective:  No chief complaint on file.   History of Present Illness: [name] is a 77 y.o. female ***     Activities of Daily Living:  Patient reports morning stiffness for *** {minute/hour:19697}.   Patient {ACTIONS;DENIES/REPORTS:21021675::"Denies"} nocturnal pain.  Difficulty dressing/grooming: {ACTIONS;DENIES/REPORTS:21021675::"Denies"} Difficulty climbing stairs: {ACTIONS;DENIES/REPORTS:21021675::"Denies"} Difficulty getting out of chair: {ACTIONS;DENIES/REPORTS:21021675::"Denies"} Difficulty using hands for taps, buttons, cutlery, and/or writing: {ACTIONS;DENIES/REPORTS:21021675::"Denies"}  No Rheumatology ROS completed.   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   Anxiety 04/22/2020   Lumbar sprain 04/11/2017   Subluxation of patellofemoral joint 08/26/2016   Osteoarthritis of knee 08/26/2016   Migraine without status migrainosus, not intractable 10/30/2013   Asthma 01/01/2008   Depression 01/08/2003   Raynaud's syndrome without gangrene 01/26/2002   Irritable bowel syndrome 03/20/1999    Past Medical History:  Diagnosis Date   HLD (hyperlipidemia)    HTN (hypertension) 08/01/2021    IBS (irritable bowel syndrome)    Osteoporosis    Sinusitis    Status post placement of cardiac pacemaker 07/31/21 St Jude Medical Assurity MRI dual-chamber pacemaker  08/01/2021   Symptomatic bradycardia 08/01/2021    Family History  Problem Relation Age of Onset   Stroke Mother    Hypertension Mother    Breast cancer Sister    Hypertension Other    Colon cancer Neg Hx    Past Surgical History:  Procedure Laterality Date   CATARACT EXTRACTION     NASAL SINUS SURGERY     PACEMAKER IMPLANT N/A 07/31/2021   Procedure: PACEMAKER IMPLANT;  Surgeon: Regan Lemming, MD;  Location: [facility];  Service: Cardiovascular;  Laterality: N/A;   TUBAL LIGATION     Social History   Social History Narrative   Widowed 2022.     Lived in [place] for 50 years.     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   Immunization History  Administered Date(s) Administered   COVID-19, mRNA, vaccine(Comirnaty)12 years and older 11/28/2021   Fluad Quad(high Dose 65+) 12/28/2021   Influenza Split 12/23/2003, 11/21/2006, 10/20/2007, 10/21/2008   Moderna Covid-19 Vaccine Bivalent Booster 62yrs & up 01/19/2021   Moderna Sars-Covid-2 Vaccination 03/10/2019, 04/07/2019, 01/19/2021   Pneumococcal Conjugate-13 12/24/2015   Pneumococcal Polysaccharide-23 07/21/2015, 11/07/2015, 12/19/2018, 03/15/2022   Td (Adult),5 Lf Tetanus Toxid, Preservative Free 02/21/2004     Objective: Vital Signs: There were no vitals taken for this visit.   Physical Exam   Musculoskeletal Exam: ***  CDAI Exam: CDAI Score: -- Patient Global: --; Provider Global: -- Swollen: --; Tender: -- Joint Exam 08/31/2022   No joint exam has  been documented for this visit   There is currently no information documented on the homunculus. Go to the Rheumatology activity and complete the homunculus joint exam.  Investigation: No additional findings.  Imaging: DG Bone Density  Result Date:  08/16/2022 Table formatting from the original result was not included. Date of study: 08/13/2022 Exam: DUAL X-RAY ABSORPTIOMETRY (DXA) FOR BONE MINERAL DENSITY (BMD) Instrument: [employer] Requesting Provider: PCP Indication: follow up for low BMD Comparison: none (please note that it is not possible to compare data from different instruments) Clinical data: Pt is a 77 y.o. female without previous history of fracture. On calcium and vitamin D. Results:  Lumbar spine L1-L3 (L2, L4) Femoral neck (FN) 33% distal radius T-score +0.2 RFN: -1.3 LFN: -1.7 n/a Assessment: the BMD is low according to the [facility] classification for osteoporosis (see below). Fracture risk: moderate FRAX score: 10 year major osteoporotic risk: 8.0%. 10 year hip fracture risk: 2.2%. The thresholds for treatment are 20% and 3%, respectively. Comments: the technical quality of the study is good, however, L3 and L4 vertebrae had to be excluded from analysis due to degenerative changes. Evaluation for secondary causes should be considered if clinically indicated. Recommend optimizing calcium (1200 mg/day) and vitamin D (800 IU/day) intake. Followup: Repeat BMD is appropriate after 2 years. WHO criteria for diagnosis of osteoporosis in postmenopausal women and in men 13 y/o or older: - normal: T-score -1.0 to + 1.0 - osteopenia/low bone density: T-score between -2.5 and -1.0 - osteoporosis: T-score below -2.5 - severe osteoporosis: T-score below -2.5 with history of fragility fracture Note: although not part of the WHO classification, the presence of a fragility fracture, regardless of the T-score, should be considered diagnostic of osteoporosis, provided other causes for the fracture have been excluded. Treatment: The National Osteoporosis Foundation recommends that treatment be considered in postmenopausal women and men age 34 or older with: 1. Hip or vertebral (clinical or morphometric) fracture 2. T-score of - 2.5 or lower at the spine or hip  3. 10-year fracture probability by FRAX of at least 20% for a major osteoporotic fracture and 3% for a hip fracture Carlus Pavlov, MD  Endocrinology   CUP PACEART REMOTE DEVICE CHECK  Result Date: 08/05/2022 Scheduled remote reviewed. Normal device function.  14 AMS, 4-16sec in duration, FF oversensing Next remote 91 days. LA, CVRS   Recent Labs: Lab Results  Component Value Date   WBC 7.9 04/05/2022   HGB 12.6 04/05/2022   PLT 297.0 04/05/2022   NA 138 12/28/2021   K 4.1 12/28/2021   CL 102 12/28/2021   CO2 28 12/28/2021   GLUCOSE 95 12/28/2021   BUN 14 12/28/2021   CREATININE 0.62 12/28/2021   BILITOT 0.2 (L) 04/03/2020   ALKPHOS 38 04/03/2020   AST 31 04/03/2020   ALT 32 04/03/2020   PROT 6.0 (L) 04/03/2020   ALBUMIN 3.0 (L) 04/03/2020   CALCIUM 10.0 12/28/2021   GFRAA  10/02/2006    >60        The eGFR has been calculated using the MDRD equation. This calculation has not been validated in all clinical    Speciality Comments: No specialty comments available.  Procedures:  No procedures performed Allergies: Azithromycin, Levofloxacin, Sulfa antibiotics, and Ciprofloxacin   Assessment / Plan:     Visit Diagnoses: Polyarthritis - Pannus over PIPs  Raynaud's syndrome without gangrene  Primary hypertension  AV block  Hx of migraines  History of asthma  History of IBS  History of gastroesophageal  reflux (GERD)  Status post placement of cardiac pacemaker 07/31/21 St Jude Medical Assurity MRI dual-chamber pacemaker   History of hyperlipidemia  Anxiety and depression  Orders: No orders of the defined types were placed in this encounter.  No orders of the defined types were placed in this encounter.   Face-to-face time spent with patient was *** minutes. Greater than 50% of time was spent in counseling and coordination of care.  Follow-Up Instructions: No follow-ups on file.   Gearldine Bienenstock, PA-C  Note - This record has been created using  Dragon software.  Chart creation errors have been sought, but may not always  have been located. Such creation errors do not reflect on  the standard of medical care.

## 2022-08-19 ENCOUNTER — Telehealth: Payer: Self-pay

## 2022-08-19 NOTE — Telephone Encounter (Signed)
Patient called and states she still has the sinus infection. The only sx she would give me was that she is still getting green mucus out of her nose. She finished her 3 rd of antibiotics and needs to know what to do now?

## 2022-08-19 NOTE — Telephone Encounter (Signed)
When is she seeing ENT?  Please let me know as that will affect the plan. Thanks.

## 2022-08-19 NOTE — Telephone Encounter (Signed)
Patient said she has a hard time getting in with her ENT and wanted to know if there was something else she needs to do

## 2022-08-20 MED ORDER — AMOXICILLIN-POT CLAVULANATE 875-125 MG PO TABS: 1.0000 | ORAL_TABLET | Freq: Two times a day (BID) | ORAL | 0 refills | Status: DC

## 2022-08-20 NOTE — Telephone Encounter (Signed)
I routed this to referrals to see if her ENT appointment can get moved up.    Did she improve on augmentin in 07/2022?  If so, then it may make sense to restart that in the meantime.  I sent that rx in the meantime.  Thanks.

## 2022-08-20 NOTE — Addendum Note (Signed)
Addended by: Joaquim Nam on: 08/20/2022 02:00 PM   Modules accepted: Orders

## 2022-08-20 NOTE — Telephone Encounter (Signed)
We will have to call Monday morning to follow up on this referral. Looks like notes from ENT in Feb 2024 showed that ENT has been trying to reach the patient.

## 2022-08-20 NOTE — Telephone Encounter (Signed)
Spoke with patient and she does not remember if she called ENT to try and schedule appt nor did they contact her. She stated the augmentin did help mostly but still has this green mucus that keeps coming up. Advised her that rx was sent in to her pharmacy.

## 2022-08-23 NOTE — Telephone Encounter (Signed)
Per Care Everywhere, there are two phone encounters with [facility] [facility] ([facility]) where they were unable to reach the patient.   The last communication was 04/01/2022   Telephone Encounter - Susy Frizzle, MD - 04/01/2022 9:09 AM EST No answer and her voicemail box is full.  Electronically signed by: Susy Frizzle, MD 04/01/22 [phone]   I sent the patient a Mychart message last week with the contact information of who she needed to contact to schedule her appt. They may need a new referral if they will not accept the old referral.   Please see Mychart message and/or referral for updates.

## 2022-08-24 NOTE — Telephone Encounter (Signed)
Spoke with patient about her referral. She will call them after she finishes her abx.

## 2022-08-27 NOTE — Progress Notes (Deleted)
Office Visit Note  Patient: [name]             Date of Birth: 06/23/1945           MRN: [id number]             PCP: Joaquim Nam, MD Referring: Joaquim Nam, MD Visit Date: 09/10/2022 Occupation: @GUAROCC @  Subjective:  No chief complaint on file.   History of Present Illness: [name] is a 77 y.o. female ***     Activities of Daily Living:  Patient reports morning stiffness for *** {minute/hour:19697}.   Patient {ACTIONS;DENIES/REPORTS:21021675::"Denies"} nocturnal pain.  Difficulty dressing/grooming: {ACTIONS;DENIES/REPORTS:21021675::"Denies"} Difficulty climbing stairs: {ACTIONS;DENIES/REPORTS:21021675::"Denies"} Difficulty getting out of chair: {ACTIONS;DENIES/REPORTS:21021675::"Denies"} Difficulty using hands for taps, buttons, cutlery, and/or writing: {ACTIONS;DENIES/REPORTS:21021675::"Denies"}  No Rheumatology ROS completed.   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   Anxiety 04/22/2020   Lumbar sprain 04/11/2017   Subluxation of patellofemoral joint 08/26/2016   Osteoarthritis of knee 08/26/2016   Migraine without status migrainosus, not intractable 10/30/2013   Asthma 01/01/2008   Depression 01/08/2003   Raynaud's syndrome without gangrene 01/26/2002   Irritable bowel syndrome 03/20/1999    Past Medical History:  Diagnosis Date   HLD (hyperlipidemia)    HTN (hypertension) 08/01/2021    IBS (irritable bowel syndrome)    Osteoporosis    Sinusitis    Status post placement of cardiac pacemaker 07/31/21 [employer] Jude Medical [employer]urity MRI dual-chamber pacemaker  08/01/2021   Symptomatic bradycardia 08/01/2021    Family History  Problem Relation Age of Onset   Stroke Mother    Hypertension Mother    Breast cancer Sister    Hypertension Other    Colon cancer Neg Hx    Past Surgical History:  Procedure Laterality Date   CATARACT EXTRACTION     NASAL SINUS SURGERY     PACEMAKER IMPLANT N/A 07/31/2021   Procedure: PACEMAKER IMPLANT;  Surgeon: Regan Lemming, MD;  Location: [facility];  Service: Cardiovascular;  Laterality: N/A;   TUBAL LIGATION     Social History   Social History Narrative   Widowed 2022.     Lived in [place] for 50 years.     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   Immunization History  Administered Date(s) Administered   COVID-19, mRNA, vaccine(Comirnaty)12 years and older 11/28/2021   Fluad Quad(high Dose 65+) 12/28/2021   Influenza Split 12/23/2003, 11/21/2006, 10/20/2007, 10/21/2008   Moderna Covid-19 Vaccine Bivalent Booster 31yrs & up 01/19/2021   Moderna Sars-Covid-2 Vaccination 03/10/2019, 04/07/2019, 01/19/2021   Pneumococcal Conjugate-13 12/24/2015   Pneumococcal Polysaccharide-23 07/21/2015, 11/07/2015, 12/19/2018, 03/15/2022   Td (Adult),5 Lf Tetanus Toxid, Preservative Free 02/21/2004     Objective: Vital Signs: There were no vitals taken for this visit.   Physical Exam   Musculoskeletal Exam: ***  CDAI Exam: CDAI Score: -- Patient Global: --; Provider Global: -- Swollen: --; Tender: -- Joint Exam 09/10/2022   No joint exam has  been documented for this visit   There is currently no information documented on the homunculus. Go to the Rheumatology activity and complete the homunculus joint exam.  Investigation: No additional findings.  Imaging: DG Bone Density  Result Date:  08/16/2022 Table formatting from the original result was not included. Date of study: 08/13/2022 Exam: DUAL X-RAY ABSORPTIOMETRY (DXA) FOR BONE MINERAL DENSITY (BMD) Instrument: [employer] Requesting Provider: PCP Indication: follow up for low BMD Comparison: none (please note that it is not possible to compare data from different instruments) Clinical data: Pt is a 77 y.o. female without previous history of fracture. On calcium and vitamin D. Results:  Lumbar spine L1-L3 (L2, L4) Femoral neck (FN) 33% distal radius T-score +0.2 RFN: -1.3 LFN: -1.7 n/a Assessment: the BMD is low according to the [facility] classification for osteoporosis (see below). Fracture risk: moderate FRAX score: 10 year major osteoporotic risk: 8.0%. 10 year hip fracture risk: 2.2%. The thresholds for treatment are 20% and 3%, respectively. Comments: the technical quality of the study is good, however, L3 and L4 vertebrae had to be excluded from analysis due to degenerative changes. Evaluation for secondary causes should be considered if clinically indicated. Recommend optimizing calcium (1200 mg/day) and vitamin D (800 IU/day) intake. Followup: Repeat BMD is appropriate after 2 years. WHO criteria for diagnosis of osteoporosis in postmenopausal women and in men 81 y/o or older: - normal: T-score -1.0 to + 1.0 - osteopenia/low bone density: T-score between -2.5 and -1.0 - osteoporosis: T-score below -2.5 - severe osteoporosis: T-score below -2.5 with history of fragility fracture Note: although not part of the WHO classification, the presence of a fragility fracture, regardless of the T-score, should be considered diagnostic of osteoporosis, provided other causes for the fracture have been excluded. Treatment: The National Osteoporosis Foundation recommends that treatment be considered in postmenopausal women and men age 59 or older with: 1. Hip or vertebral (clinical or morphometric) fracture 2. T-score of - 2.5 or lower at the spine or hip  3. 10-year fracture probability by FRAX of at least 20% for a major osteoporotic fracture and 3% for a hip fracture Carlus Pavlov, MD [facility]   CUP PACEART REMOTE DEVICE CHECK  Result Date: 08/05/2022 Scheduled remote reviewed. Normal device function.  14 AMS, 4-16sec in duration, FF oversensing Next remote 91 days. LA, CVRS   Recent Labs: Lab Results  Component Value Date   WBC 7.9 04/05/2022   HGB 12.6 04/05/2022   PLT 297.0 04/05/2022   NA 138 12/28/2021   K 4.1 12/28/2021   CL 102 12/28/2021   CO2 28 12/28/2021   GLUCOSE 95 12/28/2021   BUN 14 12/28/2021   CREATININE 0.62 12/28/2021   BILITOT 0.2 (L) 04/03/2020   ALKPHOS 38 04/03/2020   AST 31 04/03/2020   ALT 32 04/03/2020   PROT 6.0 (L) 04/03/2020   ALBUMIN 3.0 (L) 04/03/2020   CALCIUM 10.0 12/28/2021   GFRAA  10/02/2006    >60        The eGFR has been calculated using the MDRD equation. This calculation has not been validated in all clinical   September 01, 2022 RF negative, anti-CCP negative, ESR 11, CRP<3.0, uric acid 3.1, vitamin D 41 Speciality Comments: No specialty comments available.  Procedures:  No procedures performed Allergies: Azithromycin, Levofloxacin, Sulfa antibiotics, and Ciprofloxacin   Assessment / Plan:     Visit Diagnoses: No diagnosis found.  Orders: No orders of the defined types were placed in this encounter.  No orders of the defined types were placed in this encounter.   Face-to-face time spent with patient was *** minutes. Greater than 50% of time was spent in counseling and coordination of care.  Follow-Up Instructions: No follow-ups on file.   Pollyann Savoy, MD  Note - This record has been created using Animal nutritionist.  Chart creation errors have been sought, but may not always  have been located. Such creation errors do not reflect on  the standard of medical care.

## 2022-08-31 ENCOUNTER — Encounter: Payer: Medicare Other | Admitting: Physician Assistant

## 2022-08-31 DIAGNOSIS — I7300 Raynaud's syndrome without gangrene: Secondary | ICD-10-CM

## 2022-08-31 DIAGNOSIS — Z8669 Personal history of other diseases of the nervous system and sense organs: Secondary | ICD-10-CM

## 2022-08-31 DIAGNOSIS — Z950 Presence of cardiac pacemaker: Secondary | ICD-10-CM

## 2022-08-31 DIAGNOSIS — I10 Essential (primary) hypertension: Secondary | ICD-10-CM

## 2022-08-31 DIAGNOSIS — Z8639 Personal history of other endocrine, nutritional and metabolic disease: Secondary | ICD-10-CM

## 2022-08-31 DIAGNOSIS — M130 Polyarthritis, unspecified: Secondary | ICD-10-CM

## 2022-08-31 DIAGNOSIS — F419 Anxiety disorder, unspecified: Secondary | ICD-10-CM

## 2022-08-31 DIAGNOSIS — Z8719 Personal history of other diseases of the digestive system: Secondary | ICD-10-CM

## 2022-08-31 DIAGNOSIS — Z8709 Personal history of other diseases of the respiratory system: Secondary | ICD-10-CM

## 2022-08-31 DIAGNOSIS — I4430 Unspecified atrioventricular block: Secondary | ICD-10-CM

## 2022-09-01 ENCOUNTER — Ambulatory Visit: Payer: Medicare Other | Attending: Physician Assistant | Admitting: Physician Assistant

## 2022-09-01 ENCOUNTER — Ambulatory Visit: Payer: Medicare Other

## 2022-09-01 ENCOUNTER — Ambulatory Visit (INDEPENDENT_AMBULATORY_CARE_PROVIDER_SITE_OTHER): Payer: Medicare Other

## 2022-09-01 ENCOUNTER — Encounter: Payer: Self-pay | Admitting: Physician Assistant

## 2022-09-01 VITALS — BP 117/75 | HR 65 | Resp 14 | Ht 64.0 in | Wt 136.4 lb

## 2022-09-01 DIAGNOSIS — M8589 Other specified disorders of bone density and structure, multiple sites: Secondary | ICD-10-CM | POA: Diagnosis not present

## 2022-09-01 DIAGNOSIS — Z8781 Personal history of (healed) traumatic fracture: Secondary | ICD-10-CM

## 2022-09-01 DIAGNOSIS — M79672 Pain in left foot: Secondary | ICD-10-CM

## 2022-09-01 DIAGNOSIS — I7300 Raynaud's syndrome without gangrene: Secondary | ICD-10-CM

## 2022-09-01 DIAGNOSIS — F419 Anxiety disorder, unspecified: Secondary | ICD-10-CM

## 2022-09-01 DIAGNOSIS — M79642 Pain in left hand: Secondary | ICD-10-CM | POA: Diagnosis not present

## 2022-09-01 DIAGNOSIS — M130 Polyarthritis, unspecified: Secondary | ICD-10-CM

## 2022-09-01 DIAGNOSIS — Z8669 Personal history of other diseases of the nervous system and sense organs: Secondary | ICD-10-CM

## 2022-09-01 DIAGNOSIS — H209 Unspecified iridocyclitis: Secondary | ICD-10-CM | POA: Diagnosis not present

## 2022-09-01 DIAGNOSIS — M79671 Pain in right foot: Secondary | ICD-10-CM | POA: Diagnosis not present

## 2022-09-01 DIAGNOSIS — F32A Depression, unspecified: Secondary | ICD-10-CM

## 2022-09-01 DIAGNOSIS — Z8639 Personal history of other endocrine, nutritional and metabolic disease: Secondary | ICD-10-CM | POA: Diagnosis not present

## 2022-09-01 DIAGNOSIS — Z8719 Personal history of other diseases of the digestive system: Secondary | ICD-10-CM | POA: Diagnosis not present

## 2022-09-01 DIAGNOSIS — I4430 Unspecified atrioventricular block: Secondary | ICD-10-CM

## 2022-09-01 DIAGNOSIS — R001 Bradycardia, unspecified: Secondary | ICD-10-CM

## 2022-09-01 DIAGNOSIS — M79641 Pain in right hand: Secondary | ICD-10-CM

## 2022-09-01 DIAGNOSIS — E559 Vitamin D deficiency, unspecified: Secondary | ICD-10-CM

## 2022-09-01 DIAGNOSIS — Z8709 Personal history of other diseases of the respiratory system: Secondary | ICD-10-CM | POA: Diagnosis not present

## 2022-09-01 DIAGNOSIS — I10 Essential (primary) hypertension: Secondary | ICD-10-CM

## 2022-09-01 DIAGNOSIS — Z950 Presence of cardiac pacemaker: Secondary | ICD-10-CM

## 2022-09-01 LAB — CBC WITH DIFFERENTIAL/PLATELET
Absolute Monocytes: 639 cells/uL (ref 200–950)
Basophils Relative: 0.9 %
Eosinophils Absolute: 153 cells/uL (ref 15–500)
Eosinophils Relative: 1.7 %
HCT: 36.9 % (ref 35.0–45.0)
Hemoglobin: 12.2 g/dL (ref 11.7–15.5)
Lymphs Abs: 1782 cells/uL (ref 850–3900)
MCH: 31 pg (ref 27.0–33.0)
MCHC: 33.1 g/dL (ref 32.0–36.0)
MPV: 9.1 fL (ref 7.5–12.5)
Monocytes Relative: 7.1 %
Neutrophils Relative %: 70.5 %
Platelets: 258 10*3/uL (ref 140–400)
RBC: 3.93 10*6/uL (ref 3.80–5.10)

## 2022-09-01 NOTE — Progress Notes (Signed)
Office Visit Note  Patient: [name]             Date of Birth: 18-Jul-1945           MRN: [id number]             PCP: Joaquim Nam, MD Referring: Omar Person, MD Visit Date: 09/01/2022 Occupation: @GUAROCC @  Subjective:  Pain in both hands intermittently   History of Present Illness: [name] is a 77 y.o. female who presents today for a new patient consultation.  Patient reports that she moved from [place] about 2 years ago.  She states that she was previously evaluated by rheumatologist about 40 years ago and had an initial consultation.  According to the patient she was told at that time that her arthritis was mild and did not require treatment.  She has not been evaluated again by any rheumatologist.  Patient states that in the past she has seen an orthopedist for evaluation of pain in her right knee.  She was previously wearing a brace on the right knee for support which was helpful but she has discontinued.  Patient states that her biggest concern has been intermittent pain in both hands.  She states that she has episodic pain and stiffness involving both hands but it is not a daily occurrence.  She states she has also had intermittent discomfort in the left hip joint down in the groin.  Patient states that about 1 month ago she started to take omega-3 XL which she feels has helped with her joint pain and inflammation.  She has acetaminophen on hand to take if her pain is severe. 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 She denies any known family history of rheumatoid arthritis but has 1 niece with lupus.  She denies any personal history of psoriasis.  Patient denies being diagnosed with uveitis in the past.    Activities of Daily Living:  Patient reports morning stiffness for 0 minutes.   Patient Denies nocturnal pain.  Difficulty  dressing/grooming: Denies Difficulty climbing stairs: Denies Difficulty getting out of chair: Denies Difficulty using hands for taps, buttons, cutlery, and/or writing: Denies  Review of Systems  Constitutional:  Negative for fatigue.  HENT:  Negative for mouth sores, mouth dryness and nose dryness.   Eyes:  Positive for dryness. Negative for pain and visual disturbance.  Respiratory:  Positive for cough and shortness of breath. Negative for hemoptysis and difficulty breathing.   Cardiovascular:  Negative for chest pain, palpitations, hypertension and swelling in legs/feet.  Gastrointestinal:  Positive for diarrhea. Negative for blood in stool and constipation.  Endocrine: Negative for increased urination.  Genitourinary:  Negative for painful urination.  Musculoskeletal:  Positive for joint pain, joint pain, joint swelling, myalgias, muscle tenderness and myalgias. Negative for muscle weakness and morning stiffness.  Skin:  Positive for hair loss. Negative for color change, pallor, rash, nodules/bumps, skin tightness, ulcers and sensitivity to sunlight.  Allergic/Immunologic: Positive for susceptible to infections.  Neurological:  Positive for headaches. Negative for dizziness, numbness and weakness.  Hematological:  Negative for swollen glands.  Psychiatric/Behavioral:  Positive for depressed mood. Negative for sleep disturbance. The patient is not nervous/anxious.     PMFS History:  Patient Active Problem List   Diagnosis Date Noted   Acute recurrent pansinusitis 06/24/2022   Sinusitis 02/21/2022   Advance care  planning 12/30/2021   HLD (hyperlipidemia) 12/30/2021   Osteopenia 12/30/2021   Uveitis 12/28/2021   PVD (posterior vitreous detachment) 12/28/2021   Hypertensive retinopathy 12/28/2021   Glaucoma 12/28/2021   Acid reflux 12/28/2021   Status post placement of cardiac pacemaker 07/31/21 [employer] MRI dual-chamber pacemaker  08/01/2021   Symptomatic  bradycardia 08/01/2021   HTN (hypertension) 08/01/2021   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   Anxiety 04/22/2020   Lumbar sprain 04/11/2017   Subluxation of patellofemoral joint 08/26/2016   Osteoarthritis of knee 08/26/2016   Migraine without status migrainosus, not intractable 10/30/2013   Asthma 01/01/2008   Depression 01/08/2003   Raynaud's syndrome without gangrene 01/26/2002   Irritable bowel syndrome 03/20/1999    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 St Jude Medical Assurity MRI dual-chamber pacemaker  08/01/2021   Symptomatic bradycardia 08/01/2021    Family History  Problem Relation Age of Onset   Stroke Mother    Hypertension Mother    Breast cancer Sister    Kidney failure Son    Heart disease Son        heart transplant   Other Son        open heart surgery at 4 months old   Colon cancer Neg Hx    Past Surgical History:  Procedure Laterality Date   CATARACT EXTRACTION     NASAL SINUS SURGERY     several   PACEMAKER IMPLANT N/A 07/31/2021   Procedure: PACEMAKER IMPLANT;  Surgeon: Regan Lemming, MD;  Location: [facility];  Service: Cardiovascular;  Laterality: N/A;   toenail removal      left great toenail   TUBAL LIGATION     Social History   Social History Narrative   Widowed 2022.     Lived in [place] for 50 years.     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   Immunization History  Administered Date(s) Administered   COVID-19, mRNA, vaccine(Comirnaty)12 years and older 11/28/2021   Fluad Quad(high Dose 65+) 12/28/2021   Influenza Split 12/23/2003, 11/21/2006, 10/20/2007, 10/21/2008   Moderna Covid-19 Vaccine Bivalent Booster 67yrs & up 01/19/2021   Moderna Sars-Covid-2 Vaccination 03/10/2019, 04/07/2019, 01/19/2021   Pneumococcal Conjugate-13  12/24/2015   Pneumococcal Polysaccharide-23 07/21/2015, 11/07/2015, 12/19/2018, 03/15/2022   Td (Adult),5 Lf Tetanus Toxid, Preservative Free 02/21/2004     Objective: Vital Signs: BP 117/75 (BP Location: Right Arm, Patient Position: Sitting, Cuff Size: Normal)   Pulse 65   Resp 14   Ht 5\' 4"  (1.626 m)   Wt 136 lb 6.4 oz (61.9 kg)   BMI 23.41 kg/m    Physical Exam Vitals and nursing note reviewed.  Constitutional:      Appearance: She is well-developed.  HENT:     Head: Normocephalic and atraumatic.  Eyes:     Conjunctiva/sclera: Conjunctivae normal.  Cardiovascular:     Rate and Rhythm: Normal rate and regular rhythm.     Heart sounds: Normal heart sounds.  Pulmonary:     Effort: Pulmonary effort is normal.     Breath sounds: Normal breath sounds.  Abdominal:     General: Bowel sounds are normal.     Palpations: Abdomen is soft.  Musculoskeletal:     Cervical back: Normal range of motion.  Lymphadenopathy:  Cervical: No cervical adenopathy.  Skin:    General: Skin is warm and dry.     Capillary Refill: Capillary refill takes less than 2 seconds.  Neurological:     Mental Status: She is alert and oriented to person, place, and time.  Psychiatric:        Behavior: Behavior normal.      Musculoskeletal Exam: C-spine, thoracic spine, lumbar spine have good range of motion.  Some midline spinal tenderness in the lower lumbar region.  No SI joint tenderness.  Shoulder joints have good range of motion with no discomfort.  Elbow joints have good range of motion with no tenderness or inflammation.  Wrist joints have good range of motion with no tenderness or synovitis.  Severe PIP thickening in both hands.  Inflammation noted in the left fifth PIP joint.  Tenderness of the right fourth PIP joint.  Complete fist formation noted.  Hip joints have good range of motion with no groin pain currently.  Mild tenderness over bilateral trochanteric bursa.  Knee joints have good range of  motion with no warmth or effusion.  Ankle joints have good range of motion with no tenderness or joint swelling.  No evidence of Achilles tendinitis or plantar fasciitis.  Thickening and mild erythema noted over the first MTP joint bilaterally.  No synovitis over MTP joints.  CDAI Exam: CDAI Score: -- Patient Global: --; Provider Global: -- Swollen: 1 ; Tender: 1  Joint Exam 09/01/2022      Right  Left  PIP 4 (finger)   Tender     PIP 5 (finger)     Swollen      Investigation: No additional findings.  Imaging: DG Bone Density  Result Date: 08/16/2022 Table formatting from the original result was not included. Date of study: 08/13/2022 Exam: DUAL X-RAY ABSORPTIOMETRY (DXA) FOR BONE MINERAL DENSITY (BMD) Instrument: [employer] Requesting Provider: PCP Indication: follow up for low BMD Comparison: none (please note that it is not possible to compare data from different instruments) Clinical data: Pt is a 77 y.o. female without previous history of fracture. On calcium and vitamin D. Results:  Lumbar spine L1-L3 (L2, L4) Femoral neck (FN) 33% distal radius T-score +0.2 RFN: -1.3 LFN: -1.7 n/a Assessment: the BMD is low according to the [facility] classification for osteoporosis (see below). Fracture risk: moderate FRAX score: 10 year major osteoporotic risk: 8.0%. 10 year hip fracture risk: 2.2%. The thresholds for treatment are 20% and 3%, respectively. Comments: the technical quality of the study is good, however, L3 and L4 vertebrae had to be excluded from analysis due to degenerative changes. Evaluation for secondary causes should be considered if clinically indicated. Recommend optimizing calcium (1200 mg/day) and vitamin D (800 IU/day) intake. Followup: Repeat BMD is appropriate after 2 years. WHO criteria for diagnosis of osteoporosis in postmenopausal women and in men 74 y/o or older: - normal: T-score -1.0 to + 1.0 - osteopenia/low bone density: T-score between -2.5 and -1.0 - osteoporosis:  T-score below -2.5 - severe osteoporosis: T-score below -2.5 with history of fragility fracture Note: although not part of the WHO classification, the presence of a fragility fracture, regardless of the T-score, should be considered diagnostic of osteoporosis, provided other causes for the fracture have been excluded. Treatment: The National Osteoporosis Foundation recommends that treatment be considered in postmenopausal women and men age 65 or older with: 1. Hip or vertebral (clinical or morphometric) fracture 2. T-score of - 2.5 or lower at the spine or  hip 3. 10-year fracture probability by FRAX of at least 20% for a major osteoporotic fracture and 3% for a hip fracture Carlus Pavlov, MD [facility]   CUP PACEART REMOTE DEVICE CHECK  Result Date: 08/05/2022 Scheduled remote reviewed. Normal device function.  14 AMS, 4-16sec in duration, FF oversensing Next remote 91 days. LA, CVRS   Recent Labs: Lab Results  Component Value Date   WBC 7.9 04/05/2022   HGB 12.6 04/05/2022   PLT 297.0 04/05/2022   NA 138 12/28/2021   K 4.1 12/28/2021   CL 102 12/28/2021   CO2 28 12/28/2021   GLUCOSE 95 12/28/2021   BUN 14 12/28/2021   CREATININE 0.62 12/28/2021   BILITOT 0.2 (L) 04/03/2020   ALKPHOS 38 04/03/2020   AST 31 04/03/2020   ALT 32 04/03/2020   PROT 6.0 (L) 04/03/2020   ALBUMIN 3.0 (L) 04/03/2020   CALCIUM 10.0 12/28/2021   GFRAA  10/02/2006    >60        The eGFR has been calculated using the MDRD equation. This calculation has not been validated in all clinical    Speciality Comments: No specialty comments available.  Procedures:  No procedures performed Allergies: Azithromycin, Levofloxacin, Sulfa antibiotics, and Ciprofloxacin   Assessment / Plan:     Visit Diagnoses: Polyarthritis - Patient was previous evaluated by a rheumatologist in [place] about 40 years ago for evaluation of pain and inflammation in both hands.  At that time she was told that her arthritis  was mild and did not require treatment-did not confirm diagnosis of RA at that time.  She has never followed back up with a rheumatologist due to infrequent and mild symptoms. She has intermittent bouts of pain, stiffness, and intermittent inflammation in both hands especially in the PIP joints.  She has had to stop cake decorating and practicing calligraphy.  She takes acetaminophen sparingly for pain relief and about 1 month ago she started to take omega-3 XL which she feels has helped with her joint pain. She denies any known family history of rheumatoid arthritis but has a niece with lupus. Patient was under the care of [place] pulmonary Dr. Thora Lance for management of moderate asthma (no signs of ILD on recent chest CT).   Questionable history of uveitis--diagnosis listed on past medical history the patient denies ever being diagnosed with uveitis or iritis.  Patient is under the care of Dr. Gennaro Africa tried to obtain records prior to her visit today but did not receive any records to review.  Plan on retrying to obtain records for confirmation. On examination she has severe PIP thickening and prominence noted in both hands.  Tenderness over the right fourth PIP joint and inflammation noted in the left fifth PIP joint.  She was recently diagnosed with de Quervain's tenosynovitis of the left wrist and has been wearing a brace which has been helpful.  Plan to obtain baseline x-rays of both hands and both feet.  The following lab work will also be obtained today for further evaluation. Results will be discussed at [facility].  - Plan: Rheumatoid factor, Cyclic citrul peptide antibody, IgG, Sedimentation rate, C-reactive protein, Uric acid, XR Hand 2 View Right, XR Hand 2 View Left, CBC with Differential/Platelet, COMPLETE METABOLIC PANEL WITH GFR, XR Foot 2 Views Right, XR Foot 2 Views Left  Uveitis - Not confirmed.  History of glaucoma and hypertensive retinopathy. Under care of Dr. Gennaro Africa tried to obtain records  prior to her scheduled appointment today but never received records  to review.  No conjunctival injection.  We will attempt to call Dr. Telford Nab office to obtain records to review.    Osteopenia of multiple sites: DEXA updated on 08/13/22: LFN T-score -1.7. RFN -1.3.  vitamin D level will be checked today. Previously prescribed fosamax. No longer taking vitamin D supplement.  Reviewed chest CT results from 06/17/2022 which revealed chronic T3 compression fracture and a chronic L2 compression deformity.  We can further discuss treatment options in the future.   History of vertebral fracture: Upon chart review of the chest CT from 06/17/2022 noted chronic severe T3 compression fracture and a new upper plate compression deformity of the L2 vertebral body with about 50% central height loss--chronic.    Vitamin D deficiency -Vitamin D level will be checked today as requested. Plan: VITAMIN D 25 Hydroxy (Vit-D Deficiency, Fractures)  Other medical conditions are listed as follows:   Primary hypertension: Blood pressure was 117/75 today in the office.   AV block  Hx of migraines  History of asthma: Under care of Dr. Thora Lance.  Reviewed office visit note from 05/31/2022-moderate persistent asthma with chronic productive cough.  Chest CT updated on 06/17/2022.  History of IBS  History of esophageal reflux  Symptomatic bradycardia  Status post placement of cardiac pacemaker 07/31/21 St Jude Medical [employer]urity MRI dual-chamber pacemaker   History of hyperlipidemia  Anxiety and depression    Orders: Orders Placed This Encounter  Procedures   XR Hand 2 View Right   XR Hand 2 View Left   XR Foot 2 Views Right   XR Foot 2 Views Left   Rheumatoid factor   Cyclic citrul peptide antibody, IgG   Sedimentation rate   C-reactive protein   Uric acid   VITAMIN D 25 Hydroxy (Vit-D Deficiency, Fractures)   CBC with Differential/Platelet   COMPLETE METABOLIC PANEL WITH GFR   No orders of the defined types  were placed in this encounter.    Follow-Up Instructions: Return for [facility]PFU.   Gearldine Bienenstock, PA-C  Note - This record has been created using Dragon software.  Chart creation errors have been sought, but may not always  have been located. Such creation errors do not reflect on  the standard of medical care.

## 2022-09-02 LAB — COMPLETE METABOLIC PANEL WITH GFR
AG Ratio: 1.2 (calc) (ref 1.0–2.5)
ALT: 18 U/L (ref 6–29)
AST: 23 U/L (ref 10–35)
Albumin: 4.3 g/dL (ref 3.6–5.1)
Alkaline phosphatase (APISO): 72 U/L (ref 37–153)
BUN: 13 mg/dL (ref 7–25)
CO2: 30 mmol/L (ref 20–32)
Chloride: 102 mmol/L (ref 98–110)
Creat: 0.76 mg/dL (ref 0.60–1.00)
Globulin: 3.6 g/dL (calc) (ref 1.9–3.7)
Glucose, Bld: 89 mg/dL (ref 65–99)
Potassium: 4.7 mmol/L (ref 3.5–5.3)
Sodium: 138 mmol/L (ref 135–146)
Total Bilirubin: 0.3 mg/dL (ref 0.2–1.2)
Total Protein: 7.9 g/dL (ref 6.1–8.1)
eGFR: 80 mL/min/{1.73_m2} (ref >=60)

## 2022-09-02 LAB — URIC ACID: Uric Acid, Serum: 3.7 mg/dL (ref 2.5–7.0)

## 2022-09-02 LAB — VITAMIN D 25 HYDROXY (VIT D DEFICIENCY, FRACTURES): Vit D, 25-Hydroxy: 41 ng/mL (ref 30–100)

## 2022-09-02 LAB — SEDIMENTATION RATE: Sed Rate: 11 mm/h (ref 0–30)

## 2022-09-02 LAB — CBC WITH DIFFERENTIAL/PLATELET
Basophils Absolute: 81 cells/uL (ref 0–200)
MCV: 93.9 fL (ref 80.0–100.0)
Neutro Abs: 6345 cells/uL (ref 1500–7800)
RDW: 13.6 % (ref 11.0–15.0)
Total Lymphocyte: 19.8 %
WBC: 9 10*3/uL (ref 3.8–10.8)

## 2022-09-02 LAB — C-REACTIVE PROTEIN: CRP: <3 mg/L (ref <8.0)

## 2022-09-02 LAB — RHEUMATOID FACTOR: Rheumatoid fact SerPl-aCnc: <10 IU/mL (ref <14)

## 2022-09-02 NOTE — Progress Notes (Signed)
Vitamin D [id number] to continue maintenance dose of vitamin D.    Rest of lab work will be discussed at [facility]

## 2022-09-10 ENCOUNTER — Ambulatory Visit: Payer: Medicare Other | Admitting: Rheumatology

## 2022-09-10 DIAGNOSIS — Z8719 Personal history of other diseases of the digestive system: Secondary | ICD-10-CM

## 2022-09-10 DIAGNOSIS — Z8639 Personal history of other endocrine, nutritional and metabolic disease: Secondary | ICD-10-CM

## 2022-09-10 DIAGNOSIS — M19071 Primary osteoarthritis, right ankle and foot: Secondary | ICD-10-CM

## 2022-09-10 DIAGNOSIS — H209 Unspecified iridocyclitis: Secondary | ICD-10-CM

## 2022-09-10 DIAGNOSIS — I4430 Unspecified atrioventricular block: Secondary | ICD-10-CM

## 2022-09-10 DIAGNOSIS — Z950 Presence of cardiac pacemaker: Secondary | ICD-10-CM

## 2022-09-10 DIAGNOSIS — E559 Vitamin D deficiency, unspecified: Secondary | ICD-10-CM

## 2022-09-10 DIAGNOSIS — F419 Anxiety disorder, unspecified: Secondary | ICD-10-CM

## 2022-09-10 DIAGNOSIS — M8589 Other specified disorders of bone density and structure, multiple sites: Secondary | ICD-10-CM

## 2022-09-10 DIAGNOSIS — I10 Essential (primary) hypertension: Secondary | ICD-10-CM

## 2022-09-10 DIAGNOSIS — Z8669 Personal history of other diseases of the nervous system and sense organs: Secondary | ICD-10-CM

## 2022-09-10 DIAGNOSIS — Z8709 Personal history of other diseases of the respiratory system: Secondary | ICD-10-CM

## 2022-09-10 DIAGNOSIS — M19041 Primary osteoarthritis, right hand: Secondary | ICD-10-CM

## 2022-09-10 DIAGNOSIS — Z8781 Personal history of (healed) traumatic fracture: Secondary | ICD-10-CM

## 2022-09-14 NOTE — Progress Notes (Unsigned)
Office Visit Note  Patient: [name]             Date of Birth: 28-Nov-1945           MRN: [id number]             PCP: Joaquim Nam, MD Referring: Joaquim Nam, MD Visit Date: 09/15/2022 Occupation: @GUAROCC @  Subjective:  Discuss results    History of Present Illness: [name] is a 77 y.o. female with past medical history of osteoarthritis.  Patient presents today to discuss lab results and x-ray results from her initial office visit on 09/01/2022.  Patient continues to have pain in both hands and has having increased discomfort in her left wrist.  She is wearing a left wrist brace due to performing overuse activities yesterday.  Patient currently rates the pain in her hands a 2 out of 10.  She states that the pain in her hands does not prevent her from performing activities and she has not had any pain at night.  She does experience stiffness especially with repetitive or overuse activities.  She takes Tylenol as needed for moderate to severe pain relief.  Patient continues to have a chronic cough and is under the care of [facility].  She had an appointment today and will be establishing care with Dr. Isaiah Serge.  She also has an appointment with ENT tomorrow.  Activities of Daily Living:  Patient reports morning stiffness for 0 minutes.   Patient Denies nocturnal pain.  Difficulty dressing/grooming: Denies Difficulty climbing stairs: Denies Difficulty getting out of chair: Denies Difficulty using hands for taps, buttons, cutlery, and/or writing: Reports  Review of Systems  Constitutional:  Positive for fatigue.  HENT:  Positive for mouth dryness. Negative for mouth sores.   Eyes:  Negative for dryness.  Respiratory:  Negative for shortness of breath.   Cardiovascular:  Negative for chest pain and palpitations.  Gastrointestinal:  Positive for constipation and diarrhea. Negative for blood in stool.  Endocrine: Negative for increased urination.   Genitourinary:  Negative for involuntary urination.  Musculoskeletal:  Positive for joint pain, joint pain, myalgias, muscle weakness, muscle tenderness and myalgias. Negative for gait problem, joint swelling and morning stiffness.  Skin:  Positive for rash, hair loss and sensitivity to sunlight. Negative for color change.  Allergic/Immunologic: Positive for susceptible to infections.  Neurological:  Positive for headaches. Negative for dizziness.  Hematological:  Negative for swollen glands.  Psychiatric/Behavioral:  Negative for depressed mood and sleep disturbance. The patient is nervous/anxious.     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/31/21 [employer] MRI dual-chamber pacemaker  08/01/2021   Symptomatic bradycardia 08/01/2021   HTN (hypertension) 08/01/2021   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   Anxiety 04/22/2020   Lumbar sprain 04/11/2017   Subluxation of patellofemoral joint 08/26/2016   Osteoarthritis of knee 08/26/2016   Migraine without status migrainosus, not intractable 10/30/2013   Asthma 01/01/2008   Depression 01/08/2003   Raynaud's syndrome without gangrene 01/26/2002   Irritable bowel syndrome 03/20/1999    Past Medical  History:  Diagnosis Date   HLD (hyperlipidemia)    HTN (hypertension) 08/01/2021   IBS (irritable bowel syndrome)    Osteoporosis    Sinusitis    Status post placement of cardiac pacemaker 07/31/21 St Jude Medical [employer]urity MRI dual-chamber pacemaker  08/01/2021   Symptomatic bradycardia 08/01/2021    Family  History  Problem Relation Age of Onset   Stroke Mother    Hypertension Mother    Breast cancer Sister    Kidney failure Son    Heart disease Son        heart transplant   Other Son        open heart surgery at 37 months old   Colon cancer Neg Hx    Past Surgical History:  Procedure Laterality Date   CATARACT EXTRACTION     NASAL SINUS SURGERY     several   PACEMAKER IMPLANT N/A 07/31/2021   Procedure: PACEMAKER IMPLANT;  Surgeon: Regan Lemming, MD;  Location: [facility];  Service: Cardiovascular;  Laterality: N/A;   toenail removal      left great toenail   TUBAL LIGATION     Social History   Social History Narrative   Widowed 2022.     Lived in [place] for 50 years.     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   Immunization History  Administered Date(s) Administered   COVID-19, mRNA, vaccine(Comirnaty)12 years and older 11/28/2021   Fluad Quad(high Dose 65+) 12/28/2021   Influenza Split 12/23/2003, 11/21/2006, 10/20/2007, 10/21/2008   Moderna Covid-19 Vaccine Bivalent Booster 44yrs & up 01/19/2021   Moderna Sars-Covid-2 Vaccination 03/10/2019, 04/07/2019, 01/19/2021   Pneumococcal Conjugate-13 12/24/2015   Pneumococcal Polysaccharide-23 07/21/2015, 11/07/2015, 12/19/2018, 03/15/2022   Td (Adult),5 Lf Tetanus Toxid, Preservative Free 02/21/2004     Objective: Vital Signs: BP 99/60 (BP Location: Left Arm, Patient Position: Sitting, Cuff Size: Normal)   Pulse 64   Ht 5' 4.5" (1.638 m)   Wt 135 lb 3.2 oz (61.3 kg)   BMI 22.85 kg/m    Physical Exam Vitals and nursing note reviewed.  Constitutional:      Appearance: She is well-developed.  HENT:     Head: Normocephalic and atraumatic.  Eyes:     Conjunctiva/sclera: Conjunctivae normal.  Cardiovascular:     Rate and Rhythm: Normal rate and regular rhythm.     Heart sounds: Normal heart sounds.  Pulmonary:     Effort: Pulmonary effort is normal.     Breath sounds: Rales  present.  Abdominal:     General: Bowel sounds are normal.     Palpations: Abdomen is soft.  Musculoskeletal:     Cervical back: Normal range of motion.  Skin:    General: Skin is warm and dry.     Capillary Refill: Capillary refill takes less than 2 seconds.  Neurological:     Mental Status: She is alert and oriented to person, place, and time.  Psychiatric:        Behavior: Behavior normal.      Musculoskeletal Exam: C-spine, thoracic spin, and lumbar spine good range of motion.  Shoulder joints have good ROM.  Elbow joints have good ROM with no tenderness or joint swelling.  Painful ROM of the left wrist.  Severe PIP thickening and DIP prominence.  Mild tenderness of right 3rd MCP joint.  Inflammation involving multiple PIP joints as described below.  Hip joints have good with no groin pain. Fullness in the right knee. Left  knee has good ROM with no warmth or effusion.  Ankle joints have good ROM with no tenderness or joint swelling.   CDAI Exam: CDAI Score: -- Patient Global: --; Provider Global: -- Swollen: 6 ; Tender: 3  Joint Exam 09/15/2022      Right  Left  MCP 3   Tender     PIP 3 (finger)  Swollen   Swollen   PIP 4 (finger)  Swollen   Swollen Tender  PIP 5 (finger)     Swollen Tender  DIP 3 (finger)     Swollen      Investigation: No additional findings.  Imaging: XR Foot 2 Views Left  Result Date: 09/01/2022 First MTP, PIP and DIP narrowing was noted.  No intertarsal or tibiotalar joint space narrowing was noted.  Possible subtalar joint space narrowing was noted.  No erosive changes were noted. 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  XR Foot 2 Views Right  Result Date: 09/01/2022 Generalized osteopenia was noted.  First MTP narrowing and subluxation was noted.  PIP and DIP narrowing was noted.  No intertarsal or tibiotalar narrowing was noted.  Possible subtalar joint space narrowing was noted.  No erosive changes were noted.  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  XR Hand 2 View Left  Result Date: 09/01/2022 Severe CMC, PIP and DIP narrowing was noted.  Subluxation of second DIP joint and third PIP joints were noted.  No MCP, intercarpal radiocarpal joint space narrowing was noted.  Cystic changes were noted in the carpal bones. 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  XR Hand 2 View Right  Result Date: 09/01/2022 Severe CMC, PIP and DIP narrowing was noted.  Spurring of the fourth PIP joint was noted.  Third MCP narrowing was noted.  No intercarpal or radiocarpal joint space narrowing was noted.  Cystic changes were noted in the carpal bones. Impression: These findings are suggestive of osteoarthritis.  Third MCP narrowing could be seen in inflammatory arthritis or crystal induced arthropathy.   Recent Labs: Lab Results  Component Value Date   WBC 9.0 09/01/2022   HGB 12.2 09/01/2022   PLT 258 09/01/2022   NA 138 09/01/2022   K 4.7 09/01/2022   CL 102 09/01/2022   CO2 30 09/01/2022   GLUCOSE 89 09/01/2022   BUN 13 09/01/2022   CREATININE 0.76 09/01/2022   BILITOT 0.3 09/01/2022   ALKPHOS 38 04/03/2020   AST 23 09/01/2022   ALT 18 09/01/2022   PROT 7.9 09/01/2022   ALBUMIN 3.0 (L) 04/03/2020   CALCIUM 9.7 09/01/2022   GFRAA  10/02/2006    >60        The eGFR has been calculated using the MDRD equation. This calculation has not been validated in all clinical    Speciality Comments: No specialty comments available.  Procedures:  No procedures performed Allergies: Azithromycin, Levofloxacin, Sulfa antibiotics, and Ciprofloxacin   Assessment / Plan:     Visit Diagnoses: Primary osteoarthritis of both hands - X-rays-Right hand-osteoarthritis.Third MCP narrowing could be seen in inflammatory arthritis or crystal induced arthropathy.  Severe CMC, PIP, and DIP narrowing noted.  No erosive changes. X-rays and lab results from her  initial office visit were discussed today in detail.   Uric acid within normal limits, CRP within normal limits, sed rate within normal limits, anti-CCP negative, RF negative on 09/01/2022.   On examination the arthritis in her hands is consistent with inflammatory  osteoarthritis versus possible seronegative rheumatoid arthritis.  Most of her joint involvement is in the PIP joints but she also has some fullness in the right knee joint-no effusion noted.  I also had Dr. Corliss Skains examine the patients hands today in the clinic--we discussed the possible benefits of initiating a trial of medication to try to minimize active inflammation.   Discussed different treatment options today in detail including the use of meloxicam, colchicine, or plaquenil for management of chronic inflammatory arthritis.  Patient is apprehensive to add any medications at this time since her symptoms have been mild.  She currently rates the pain and stiffness in her hands a 2 out of 10.  The discomfort in her hands has not been interfering with her quality of life and has not been causing nocturnal pain.  She has not been experiencing any morning stiffness and has not had any difficulty with ADLs recently.  She takes Tylenol as needed for moderate to severe pain relief.  She is apprehensive to try any long-term NSAIDs and is not yet ready to initiate a trial of Plaquenil.  Discussed the indications, contraindications, potential side effects of Plaquenil today in detail.  She would like to hold off on the use of Plaquenil for now.  Patient plans on trying natural anti-inflammatories including turmeric, tart cherry, ginger, and omega-3.  Discussed the importance of joint protection and muscle strengthening.  Discussed the concern for erosive changes or increased joint narrowing over time.  She will notify us if she develops any new or worsening symptoms.  She will follow-up in the office in 2 to 3 months or sooner if needed.  Primary  osteoarthritis of both feet: Anti-CCP-, RF-, uric acid WNL, ESR WNL: X-rays and clinical findings consistent with osteoarthritis.  Good range of motion of both ankle joints with no tenderness or synovitis.  Chronic cough: Under care of [facility]-previous patient of Dr. Thora Lance for management of moderate asthma (no signs of ILD on recent chest CT).  --patient will be establishing care with Dr. Isaiah Serge. She is also scheduled to establish care with ENT tomorrow.   Osteopenia of multiple sites: DEXA updated on 08/13/22: LFN T-score -1.7. RFN -1.3.  Vitamin D WNL-41 on 09/01/22.  Reviewed chest CT results from 06/17/2022 which revealed chronic T3 compression fracture and a chronic L2 compression deformity.  We can further discuss treatment options in the future.  She is currently taking Fosamax 70 mg 1 tablet by mouth once weekly.  History of vertebral fracture: Upon chart review of the chest CT from 06/17/2022 noted chronic severe T3 compression fracture and a new upper plate compression deformity of the L2 vertebral body with about 50% central height loss--chronic.     Raynaud's syndrome without gangrene: Not currently symptomatic.   Other medical conditions are listed as follows:   Primary hypertension: BP was 99/60 today in the office.   AV block  Hx of migraines  History of asthma  History of IBS  History of gastroesophageal reflux (GERD)  Symptomatic bradycardia  Status post placement of cardiac pacemaker 07/31/21 St Jude Medical Assurity MRI dual-chamber pacemaker   History of hyperlipidemia  Anxiety and depression  Orders: No orders of the defined types were placed in this encounter.  No orders of the defined types were placed in this encounter.   Follow-Up Instructions: Return in about 3 months (around 12/16/2022) for Osteoarthritis.   Gearldine Bienenstock, PA-C  Note - This record has been created using Dragon software.  Chart creation  errors have been sought, but may not  always  have been located. Such creation errors do not reflect on  the standard of medical care.

## 2022-09-15 ENCOUNTER — Encounter: Payer: Self-pay | Admitting: Physician Assistant

## 2022-09-15 ENCOUNTER — Ambulatory Visit: Payer: Medicare Other | Attending: Physician Assistant | Admitting: Physician Assistant

## 2022-09-15 ENCOUNTER — Encounter: Payer: Self-pay | Admitting: Primary Care

## 2022-09-15 ENCOUNTER — Ambulatory Visit (INDEPENDENT_AMBULATORY_CARE_PROVIDER_SITE_OTHER): Payer: Medicare Other | Admitting: Pulmonary Disease

## 2022-09-15 ENCOUNTER — Ambulatory Visit: Payer: Medicare Other | Admitting: Primary Care

## 2022-09-15 VITALS — BP 128/64 | HR 71 | Temp 98.4°F | Ht 64.5 in | Wt 137.0 lb

## 2022-09-15 VITALS — BP 99/60 | HR 64 | Ht 64.5 in | Wt 135.2 lb

## 2022-09-15 DIAGNOSIS — Z8709 Personal history of other diseases of the respiratory system: Secondary | ICD-10-CM

## 2022-09-15 DIAGNOSIS — M8589 Other specified disorders of bone density and structure, multiple sites: Secondary | ICD-10-CM | POA: Diagnosis not present

## 2022-09-15 DIAGNOSIS — R001 Bradycardia, unspecified: Secondary | ICD-10-CM | POA: Diagnosis not present

## 2022-09-15 DIAGNOSIS — F32A Depression, unspecified: Secondary | ICD-10-CM

## 2022-09-15 DIAGNOSIS — R059 Cough, unspecified: Secondary | ICD-10-CM | POA: Insufficient documentation

## 2022-09-15 DIAGNOSIS — R911 Solitary pulmonary nodule: Secondary | ICD-10-CM | POA: Diagnosis not present

## 2022-09-15 DIAGNOSIS — I7300 Raynaud's syndrome without gangrene: Secondary | ICD-10-CM | POA: Diagnosis not present

## 2022-09-15 DIAGNOSIS — R0982 Postnasal drip: Secondary | ICD-10-CM | POA: Insufficient documentation

## 2022-09-15 DIAGNOSIS — Z8781 Personal history of (healed) traumatic fracture: Secondary | ICD-10-CM

## 2022-09-15 DIAGNOSIS — M19071 Primary osteoarthritis, right ankle and foot: Secondary | ICD-10-CM

## 2022-09-15 DIAGNOSIS — I4430 Unspecified atrioventricular block: Secondary | ICD-10-CM

## 2022-09-15 DIAGNOSIS — R053 Chronic cough: Secondary | ICD-10-CM | POA: Diagnosis not present

## 2022-09-15 DIAGNOSIS — I10 Essential (primary) hypertension: Secondary | ICD-10-CM | POA: Diagnosis not present

## 2022-09-15 DIAGNOSIS — M19041 Primary osteoarthritis, right hand: Secondary | ICD-10-CM | POA: Diagnosis not present

## 2022-09-15 DIAGNOSIS — Z8639 Personal history of other endocrine, nutritional and metabolic disease: Secondary | ICD-10-CM | POA: Diagnosis not present

## 2022-09-15 DIAGNOSIS — M19042 Primary osteoarthritis, left hand: Secondary | ICD-10-CM

## 2022-09-15 DIAGNOSIS — Z8719 Personal history of other diseases of the digestive system: Secondary | ICD-10-CM | POA: Diagnosis not present

## 2022-09-15 DIAGNOSIS — Z950 Presence of cardiac pacemaker: Secondary | ICD-10-CM

## 2022-09-15 DIAGNOSIS — M19072 Primary osteoarthritis, left ankle and foot: Secondary | ICD-10-CM

## 2022-09-15 DIAGNOSIS — R051 Acute cough: Secondary | ICD-10-CM | POA: Insufficient documentation

## 2022-09-15 DIAGNOSIS — F419 Anxiety disorder, unspecified: Secondary | ICD-10-CM

## 2022-09-15 DIAGNOSIS — Z8669 Personal history of other diseases of the nervous system and sense organs: Secondary | ICD-10-CM | POA: Diagnosis not present

## 2022-09-15 LAB — PULMONARY FUNCTION TEST
DL/VA % pred: 100 %
DL/VA: 4.07 ml/min/mmHg/L
DLCO cor % pred: 61 %
DLCO cor: 11.88 ml/min/mmHg
DLCO unc % pred: 59 %
DLCO unc: 11.42 ml/min/mmHg
FEF 25-75 Post: 1.07 L/sec
FEF 25-75 Pre: 0.69 L/sec
FEF2575-%Change-Post: 55 %
FEF2575-%Pred-Post: 71 %
FEF2575-%Pred-Pre: 46 %
FEV1-%Change-Post: 9 %
FEV1-%Pred-Post: 69 %
FEV1-%Pred-Pre: 62 %
FEV1-Post: 1.4 L
FEV1-Pre: 1.27 L
FEV1FVC-%Change-Post: 6 %
FEV1FVC-%Pred-Pre: 92 %
FEV6-%Change-Post: 2 %
FEV6-%Pred-Post: 73 %
FEV6-%Pred-Pre: 71 %
FEV6-Post: 1.88 L
FEV6-Pre: 1.83 L
FEV6FVC-%Change-Post: 0 %
FEV6FVC-%Pred-Post: 104 %
FEV6FVC-%Pred-Pre: 104 %
FVC-%Change-Post: 2 %
FVC-%Pred-Post: 69 %
FVC-%Pred-Pre: 68 %
FVC-Post: 1.9 L
FVC-Pre: 1.85 L
Post FEV1/FVC ratio: 74 %
Post FEV6/FVC ratio: 99 %
Pre FEV1/FVC ratio: 69 %
Pre FEV6/FVC Ratio: 99 %
RV % pred: 97 %
RV: 2.33 L
TLC % pred: 83 %
TLC: 4.26 L

## 2022-09-15 MED ORDER — GUAIFENESIN ER 600 MG PO TB12: 600.0000 mg | ORAL_TABLET | Freq: Two times a day (BID) | ORAL | 1 refills | Status: DC | PRN

## 2022-09-15 NOTE — Assessment & Plan Note (Addendum)
-   Multifactorial due to asthma with bronchiolectasis and PND. CT chest in May 2024 showed diffuse bronchial thickening with mucus plugging, new patchy tree-in-bud consolidation R>L lower lobe segment concerning for aspiration pneumonitis.  Pulmonary function testing today consistent with moderate obstructive airway disease without significant bronchodilator response.  Moderate diffusion defect.  She is off ICS due to thrush symptoms. Maintained on Stiolto Respimat two puffs daily. Patient was able to provide a sputum sample today. Avised she take mucinex 600mg  twice daily to loosen congestion and use flutter valve three times daily. She has an apt with ENT tomorrow, holding off on abx at this time. Aspiration precautions reviewed. Continue PPI. Consider swallow study. FU first available with Dr. Isaiah Serge to establish as new patient (formerly followed by Dr. Thora Lance).

## 2022-09-15 NOTE — Progress Notes (Signed)
@Patient  ID: [name], female    DOB: 1945-09-15, 77 y.o.   MRN: [id number]  Chief Complaint  Patient presents with   Follow-up    Cough with chest congestion x several months.  Sinus pressure and congestion.    Referring provider: Joaquim Nam, MD  HPI: 77 year old female, never smoked.  Past medical history significant for hypertension, migraine headache, Raynaud's syndrome, asthma, recurrent pansinusitis, IBS, osteopenia.  Former patient of Dr. Thora Lance, last seen on 05/31/2022 for chronic cough.  09/15/2022 Patient presents today for acute office visit due to cough.  Patient had pulmonary function testing today.  She has a chronic cough, particularly worse the last month. Cough is occasionally productive with yellow mucus. Associated sinus congestion/post nasal drip symptoms. She has minimal wheezing and chest tightness. Steroids caused thrush. She takes Stiolto Respimat and montelukast 10mg  at bedtime. She is not currently taking mucinex. She uses ipratropium OR fluticasone once a day. She will be seeing ENT tomorrow. Denies difficulty swallowing or overt aspiration. CT chest in May 2024 showed diffuse bronchial thickening with mucus plugging, new patchy tree-in-bud interstitial consolidation R>L lower lobe concerning for aspiration pneumonitis. Stable 6mm upper lobe nodule x 2 years    Pulmonary function testing - 09/15/2022 PFT>> FVC 1.90 (69), FEV1 1.40 (69%), ratio 74, DLCO 11.42 (59%) - Moderate obstructive lung disease without significant BD response. Moderate diffusion defect   Allergies  Allergen Reactions   Azithromycin     Other reaction(s): gi distress, GI intolerance, Other   Levofloxacin Hives and Rash   Sulfa Antibiotics Hives    Red patches on legs years ago    Ciprofloxacin     rash    Immunization History  Administered Date(s) Administered   COVID-19, mRNA, vaccine(Comirnaty)12 years and older 11/28/2021   Fluad Quad(high Dose 65+) 12/28/2021    Influenza Split 12/23/2003, 11/21/2006, 10/20/2007, 10/21/2008   Moderna Covid-19 Vaccine Bivalent Booster 25yrs & up 01/19/2021   Moderna Sars-Covid-2 Vaccination 03/10/2019, 04/07/2019, 01/19/2021   Pneumococcal Conjugate-13 12/24/2015   Pneumococcal Polysaccharide-23 07/21/2015, 11/07/2015, 12/19/2018, 03/15/2022   Td (Adult),5 Lf Tetanus Toxid, Preservative Free 02/21/2004    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     albuterol (VENTOLIN HFA) 108 (90 Base) MCG/ACT inhaler Inhale 2 puffs into the lungs every 6 (six) hours as needed for wheezing or shortness of breath. 8 g 11   alendronate (FOSAMAX) 70 MG tablet Take 70 mg by mouth once a week. Take with a full glass of water on an empty stomach.     amLODipine (NORVASC) 10 MG tablet Take 1 tablet (10 mg total) by mouth daily. 90 tablet 3   atorvastatin (LIPITOR) 20 MG tablet Take 20 mg by mouth daily.     Azelastine HCl 137 MCG/SPRAY SOLN USE 2 SPRAYS TWICE A DAY 30 mL 5   cetirizine (ZYRTEC ALLERGY)  10 MG tablet Take 1 tablet (10 mg total) by mouth daily. 30 tablet 5   Chlorphen-Pseudoephed-APAP (CORICIDIN D PO) Take by mouth.     dicyclomine (BENTYL) 20 MG tablet TAKE 1 TABLET BY MOUTH THREE TIMES A DAY AS NEEDED 270 tablet 1   dorzolamide (TRUSOPT) 2 % ophthalmic solution SMARTSIG:In Eye(s)     FLUoxetine (PROZAC) 10 MG tablet  Take 1 tablet (10 mg total) by mouth daily. 90 tablet 3   fluticasone (FLONASE) 50 MCG/ACT nasal spray Place 2 sprays into both nostrils daily. 16 g 11   GARLIC PO Take 1 tablet by mouth every other day.     hydrocortisone 2.5 % cream Apply 1 Application topically 2 (two) times daily as needed (itching).     ipratropium (ATROVENT) 0.06 % nasal spray Place 2 sprays into both nostrils 4 (four) times daily. 15 mL 12   ketoconazole (NIZORAL) 2 % shampoo Apply topically.     latanoprost (XALATAN) 0.005 % ophthalmic solution Place 1 drop into both eyes at bedtime.     montelukast (SINGULAIR) 10 MG tablet Take 1 tablet (10 mg total) by mouth daily. 30 tablet 5   Multiple Vitamin (MULTIVITAMIN) tablet Take 1 tablet by mouth daily.     pantoprazole (PROTONIX) 40 MG tablet TAKE 1 TABLET BY MOUTH EVERY DAY 90 tablet 1   Tiotropium Bromide-Olodaterol (STIOLTO RESPIMAT) 2.5-2.5 MCG/ACT AERS Inhale 2 puffs into the lungs daily. 4 g 11   UNABLE TO FIND Med Name: omega XL     valsartan (DIOVAN) 160 MG tablet Take 1 tablet (160 mg total) by mouth 2 (two) times daily. 180 tablet 2   vitamin B-12 (CYANOCOBALAMIN) 1000 MCG tablet Take 1,000 mcg by mouth daily.     vitamin C (ASCORBIC ACID) 500 MG tablet Take 500 mg by mouth daily.     Zinc 50 MG CAPS Take 1 capsule by mouth daily.     amoxicillin-clavulanate (AUGMENTIN) 875-125 MG tablet Take 1 tablet by mouth 2 (two) times daily. (Patient not taking: Reported on 09/01/2022) 20 tablet 0   No facility-administered medications prior to visit.      Review of Systems  Review of Systems  Constitutional: Negative.   HENT:  Positive for congestion, postnasal drip and sinus pressure.   Respiratory:  Positive for cough and wheezing.      Physical Exam  BP 128/64 (BP Location: Left Arm, Patient Position: Sitting, Cuff Size: Normal)   Pulse 71   Temp 98.4 F (36.9 C) (Oral)   Ht 5' 4.5" (1.638 m)   Wt 137 lb (62.1 kg)   SpO2 98%   BMI 23.15 kg/m  Physical  Exam Constitutional:      Appearance: Normal appearance.  HENT:     Head: Normocephalic and atraumatic.     Mouth/Throat:     Mouth: Mucous membranes are moist.     Pharynx: Oropharynx is clear.  Cardiovascular:     Rate and Rhythm: Normal rate and regular rhythm.  Pulmonary:     Effort: Pulmonary effort is normal.     Breath sounds: Rales present. No wheezing or rhonchi.     Comments: Distant rales left middle lobe Musculoskeletal:        General: Normal range of motion.  Skin:    General: Skin is warm and dry.  Neurological:     General: No focal deficit present.     Mental Status: She is alert and oriented to person, place, and time. Mental status is at  baseline.  Psychiatric:        Mood and Affect: Mood normal.        Behavior: Behavior normal.        Thought Content: Thought content normal.        Judgment: Judgment normal.      Lab Results:  CBC    Component Value Date/Time   WBC 9.0 09/01/2022 1201   RBC 3.93 09/01/2022 1201   HGB 12.2 09/01/2022 1201   HGB 12.1 03/04/2022 1246   HCT 36.9 09/01/2022 1201   HCT 36.2 03/04/2022 1246   PLT 258 09/01/2022 1201   PLT 262 03/04/2022 1246   MCV 93.9 09/01/2022 1201   MCV 91 03/04/2022 1246   MCH 31.0 09/01/2022 1201   MCHC 33.1 09/01/2022 1201   RDW 13.6 09/01/2022 1201   RDW 13.6 03/04/2022 1246   LYMPHSABS 1,782 09/01/2022 1201   LYMPHSABS 2.2 03/04/2022 1246   MONOABS 0.5 04/05/2022 1024   EOSABS 153 09/01/2022 1201   EOSABS 0.2 03/04/2022 1246   BASOSABS 81 09/01/2022 1201   BASOSABS 0.1 03/04/2022 1246    BMET    Component Value Date/Time   NA 138 09/01/2022 1201   K 4.7 09/01/2022 1201   CL 102 09/01/2022 1201   CO2 30 09/01/2022 1201   GLUCOSE 89 09/01/2022 1201   BUN 13 09/01/2022 1201   CREATININE 0.76 09/01/2022 1201   CALCIUM 9.7 09/01/2022 1201   GFRNONAA >60 08/01/2021 0546   GFRAA  10/02/2006 1540    >60        The eGFR has been calculated using the MDRD equation. This  calculation has not been validated in all clinical    BNP No results found for: "BNP"  ProBNP No results found for: "PROBNP"  Imaging: XR Foot 2 Views Left  Result Date: 09/01/2022 First MTP, PIP and DIP narrowing was noted.  No intertarsal or tibiotalar joint space narrowing was noted.  Possible subtalar joint space narrowing was noted.  No erosive changes were noted. 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  XR Foot 2 Views Right  Result Date: 09/01/2022 Generalized osteopenia was noted.  First MTP narrowing and subluxation was noted.  PIP and DIP narrowing was noted.  No intertarsal or tibiotalar narrowing was noted.  Possible subtalar joint space narrowing was noted.  No erosive changes were noted. 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  XR Hand 2 View Left  Result Date: 09/01/2022 Severe CMC, PIP and DIP narrowing was noted.  Subluxation of second DIP joint and third PIP joints were noted.  No MCP, intercarpal radiocarpal joint space narrowing was noted.  Cystic changes were noted in the carpal bones. 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  XR Hand 2 View Right  Result Date: 09/01/2022 Severe CMC, PIP and DIP narrowing was noted.  Spurring of the fourth PIP joint was noted.  Third MCP narrowing was noted.  No intercarpal or radiocarpal joint space narrowing was noted.  Cystic changes were noted in the carpal bones. Impression: These findings are suggestive of osteoarthritis.  Third MCP narrowing could be seen in inflammatory arthritis or crystal induced arthropathy.    Assessment & Plan:   Chronic cough - Multifactorial due to asthma, PND and bronchiolitis. CT chest in May 2024 showed diffuse bronchial thickening with mucus plugging, new patchy tree-in-bud consolidation R>L lower lobe segment concerning for aspiration pneumonitis.  Pulmonary function testing today consistent with  moderate obstructive airway disease without significant bronchodilator response.  Moderate diffusion defect.  She is off ICS due to thrush symptoms. Maintained on Stiolto Respimat two puffs daily. Patient was able to provide a sputum sample today. Avised she take mucinex 600mg  twice daily to loosen congestion and use flutter valve three times daily. She has an apt with ENT tomorrow, holding off on abx at this time. Aspiration precautions reviewed. Continue PPI. Consider swallow study. FU first available with Dr. Isaiah Serge to establish as new patient (formerly followed by Dr. Thora Lance).   Asthma - Chronic cough. No overt wheezing on exam. Off ICS due to thrush - Continue Sitolto Respimat two puff daily, Montelukast 10mg  at bedtime and prn Albuterol hfa  Post-nasal drip - Continue Atrovent/fluticasone nasal spray  - Seeing ENT tomorrow   Pulmonary nodule - Stable 6mm upper lobe nodule x 2 years      Glenford Bayley, NP 09/15/2022

## 2022-09-15 NOTE — Patient Instructions (Addendum)
Stable lung nodule, some mucus plugging and evidence of possible aspiration   Recommendations: - Continue Stiolto respimat two puffs morning  - Continue nasal sprays and montelukast as directed  - Start mucinex 600mg  twice a day - Use Flutter valve two- three times a day (5-10 breaths) - Speak with ENT about getting a swallow study   Orders: - Sputum sample   Follow-up: - First available with Dr. Isaiah Serge (30 min visit- former Dr. Thora Lance patient)

## 2022-09-15 NOTE — Patient Instructions (Signed)
Full PFT performed today. °

## 2022-09-15 NOTE — Progress Notes (Signed)
Full PFT performed today. °

## 2022-09-15 NOTE — Assessment & Plan Note (Signed)
-   Stable 6mm upper lobe nodule x 2 years

## 2022-09-15 NOTE — Assessment & Plan Note (Signed)
-   Continue Atrovent/fluticasone nasal spray  - Seeing ENT tomorrow

## 2022-09-15 NOTE — Assessment & Plan Note (Addendum)
-   Chronic cough. No overt wheezing on exam. Off ICS due to thrush - Continue Sitolto Respimat two puff daily, Montelukast 10mg  at bedtime and prn Albuterol hfa

## 2022-09-16 DIAGNOSIS — J329 Chronic sinusitis, unspecified: Secondary | ICD-10-CM | POA: Diagnosis not present

## 2022-09-16 DIAGNOSIS — K219 Gastro-esophageal reflux disease without esophagitis: Secondary | ICD-10-CM | POA: Diagnosis not present

## 2022-09-16 DIAGNOSIS — Z9889 Other specified postprocedural states: Secondary | ICD-10-CM | POA: Diagnosis not present

## 2022-09-20 ENCOUNTER — Other Ambulatory Visit: Payer: Self-pay | Admitting: Primary Care

## 2022-09-20 MED ORDER — AMOXICILLIN-POT CLAVULANATE 875-125 MG PO TABS: 1.0000 | ORAL_TABLET | Freq: Two times a day (BID) | ORAL | 0 refills | Status: DC

## 2022-09-20 NOTE — Progress Notes (Signed)
Sputum culture positive for Pseudomonas. Patient has several drug allergies including ciprofloxacin, culture is sensitive to Zosyn. We will send in RX Augmentin.

## 2022-10-04 DIAGNOSIS — L639 Alopecia areata, unspecified: Secondary | ICD-10-CM | POA: Diagnosis not present

## 2022-10-08 ENCOUNTER — Telehealth: Payer: Self-pay | Admitting: Family Medicine

## 2022-10-08 NOTE — Telephone Encounter (Signed)
Prescription Request  10/08/2022  LOV: 08/09/2022  What is the name of the medication or equipment? alendronate (FOSAMAX) 70 MG tablet   Have you contacted your pharmacy to request a refill? No   Which pharmacy would you like this sent to?  [id number] [place], [place] - [address] [place] [address] Phone: [phone] Fax: [phone]    Patient notified that their request is being sent to the clinical staff for review and that they should receive a response within 2 business days.   Please advise at [facility] [phone]

## 2022-10-08 NOTE — Telephone Encounter (Signed)
Rx is in EMR as historical; okay to refill?

## 2022-10-11 ENCOUNTER — Other Ambulatory Visit: Payer: Self-pay | Admitting: Internal Medicine

## 2022-10-11 MED ORDER — ALENDRONATE SODIUM 70 MG PO TABS: 70.0000 mg | ORAL_TABLET | ORAL | 3 refills | Status: DC

## 2022-10-11 NOTE — Telephone Encounter (Signed)
Sent. Thanks.   

## 2022-10-12 ENCOUNTER — Other Ambulatory Visit: Payer: Self-pay

## 2022-10-12 MED ORDER — AZELASTINE HCL 137 MCG/SPRAY NA SOLN: 2.0000 | Freq: Two times a day (BID) | NASAL | 0 refills | Status: DC | PRN

## 2022-10-12 NOTE — Telephone Encounter (Signed)
Courtesy refill sent into pharmacy.  

## 2022-10-14 ENCOUNTER — Other Ambulatory Visit: Payer: Self-pay | Admitting: Family Medicine

## 2022-10-14 NOTE — Telephone Encounter (Signed)
Both given a year ago by either historical or another provider. Ok to refill?

## 2022-10-15 NOTE — Telephone Encounter (Signed)
Sent both.  Thanks.

## 2022-10-21 ENCOUNTER — Ambulatory Visit (INDEPENDENT_AMBULATORY_CARE_PROVIDER_SITE_OTHER): Payer: Medicare Other

## 2022-10-21 VITALS — Ht 64.5 in | Wt 139.0 lb

## 2022-10-21 DIAGNOSIS — Z1231 Encounter for screening mammogram for malignant neoplasm of breast: Secondary | ICD-10-CM | POA: Diagnosis not present

## 2022-10-21 DIAGNOSIS — Z0000 Encounter for general adult medical examination without abnormal findings: Secondary | ICD-10-CM | POA: Diagnosis not present

## 2022-10-21 NOTE — Patient Instructions (Signed)
Ms. Zehner , Thank you for taking time to come for your Medicare Wellness Visit. I appreciate your ongoing commitment to your health goals. Please review the following plan we discussed and let me know if I can assist you in the future.   Referrals/Orders/Follow-Ups/Clinician Recommendations: Aim for 30 minutes of exercise or brisk walking, 6-8 glasses of water, and 5 servings of fruits and vegetables each day.   This is a list of the screening recommended for you and due dates:  Health Maintenance  Topic Date Due   Hepatitis C Screening  Never done   Zoster (Shingles) Vaccine (1 of 2) Never done   DTaP/Tdap/Td vaccine (1 - Tdap) 02/22/2004   Medicare Annual Wellness Visit  05/27/2021   Flu Shot  09/09/2022   COVID-19 Vaccine (6 - 2023-24 season) 10/10/2022   Pneumonia Vaccine  Completed   DEXA scan (bone density measurement)  Completed   HPV Vaccine  Aged Out    Advanced directives: (Copy Requested) Please bring a copy of your health care power of attorney and living will to the office to be added to your chart at your convenience.  Next Medicare Annual Wellness Visit scheduled for next year: Yes

## 2022-10-21 NOTE — Progress Notes (Signed)
Subjective:   [name] is a 77 y.o. female who presents for Medicare Annual (Subsequent) preventive examination.  Visit Complete: Virtual  I connected with  [name] on 10/21/22 by a audio enabled telemedicine application and verified that I am speaking with the correct person using two identifiers.  Patient Location: Home  Provider Location: Home Office  I discussed the limitations of evaluation and management by telemedicine. The patient expressed understanding and agreed to proceed.  Vital Signs: Because this visit was a virtual/telehealth visit, some criteria may be missing or patient reported. Any vitals not documented were not able to be obtained and vitals that have been documented are patient reported.    Review of Systems      Cardiac Risk Factors include: advanced age (>56men, >63 women);hypertension;dyslipidemia     Objective:    Today's Vitals   10/21/22 1051  Weight: 139 lb (63 kg)  Height: 5' 4.5" (1.638 m)   Body mass index is 23.49 kg/m.     10/21/2022   10:56 AM 07/31/2021    3:21 AM 07/31/2021    3:00 AM 10/28/2020   11:11 PM 09/04/2020   10:51 PM 04/01/2020   12:28 PM 04/01/2020   12:00 PM  Advanced Directives  Does Patient Have a Medical Advance Directive? Yes  No No No  No  Type of Estate agent of [place];Living will        Copy of Healthcare Power of Attorney in Chart? No - copy requested        Would patient like information on creating a medical advance directive?  No - Patient declined  No - Patient declined No - Patient declined No - Patient declined     Current Medications (verified) Outpatient Encounter Medications as of 10/21/2022  Medication Sig   acetaminophen (TYLENOL) 650 MG CR tablet Take 650 mg by mouth every 8 (eight) hours as needed for pain.   albuterol (VENTOLIN HFA) 108 (90 Base) MCG/ACT inhaler Inhale 2 puffs into the lungs every 6 (six) hours as needed for wheezing or shortness of breath.    alendronate (FOSAMAX) 70 MG tablet Take 1 tablet (70 mg total) by mouth once a week. Take with a full glass of water on an empty stomach.   amLODipine (NORVASC) 10 MG tablet Take 1 tablet (10 mg total) by mouth daily.   amoxicillin-clavulanate (AUGMENTIN) 875-125 MG tablet Take 1 tablet by mouth 2 (two) times daily.   atorvastatin (LIPITOR) 20 MG tablet TAKE 1 TABLET BY MOUTH EVERY DAY   Azelastine HCl 137 MCG/SPRAY SOLN USE 2 SPRAYS NASALLY TWICE A DAY   cetirizine (ZYRTEC ALLERGY) 10 MG tablet Take 1 tablet (10 mg total) by mouth daily. (Patient taking differently: Take 10 mg by mouth as needed.)   Chlorphen-Pseudoephed-APAP (CORICIDIN D PO) Take by mouth.   dicyclomine (BENTYL) 20 MG tablet TAKE 1 TABLET BY MOUTH THREE TIMES A DAY AS NEEDED   dorzolamide (TRUSOPT) 2 % ophthalmic solution SMARTSIG:In Eye(s)   FLUoxetine (PROZAC) 10 MG tablet Take 1 tablet (10 mg total) by mouth daily.   fluticasone (FLONASE) 50 MCG/ACT nasal spray Place 2 sprays into both nostrils daily.   GARLIC PO Take 1 tablet by mouth every other day.   guaiFENesin (MUCINEX) 600 MG 12 hr tablet Take 1 tablet (600 mg total) by mouth 2 (two) times daily as needed for to loosen phlegm or cough.   hydrocortisone 2.5 % cream Apply 1 Application topically 2 (two) times daily  as needed (itching).   ipratropium (ATROVENT) 0.06 % nasal spray Place 2 sprays into both nostrils 4 (four) times daily.   ketoconazole (NIZORAL) 2 % shampoo Apply topically.   latanoprost (XALATAN) 0.005 % ophthalmic solution Place 1 drop into both eyes at bedtime.   montelukast (SINGULAIR) 10 MG tablet TAKE 1 TABLET BY MOUTH DAILY AS DIRECTED   Multiple Vitamin (MULTIVITAMIN) tablet Take 1 tablet by mouth daily.   pantoprazole (PROTONIX) 40 MG tablet TAKE 1 TABLET BY MOUTH EVERY DAY   Tiotropium Bromide-Olodaterol (STIOLTO RESPIMAT) 2.5-2.5 MCG/ACT AERS Inhale 2 puffs into the lungs daily.   UNABLE TO FIND Med Name: omega XL   valsartan (DIOVAN) 160 MG  tablet Take 1 tablet (160 mg total) by mouth 2 (two) times daily.   vitamin B-12 (CYANOCOBALAMIN) 1000 MCG tablet Take 1,000 mcg by mouth daily.   vitamin C (ASCORBIC ACID) 500 MG tablet Take 500 mg by mouth daily.   Zinc 50 MG CAPS Take 1 capsule by mouth daily.   No facility-administered encounter medications on file as of 10/21/2022.    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  08/01/2021   Symptomatic bradycardia 08/01/2021   Past Surgical History:  Procedure Laterality Date   CATARACT EXTRACTION     NASAL SINUS SURGERY     several   PACEMAKER IMPLANT N/A 07/31/2021   Procedure: PACEMAKER IMPLANT;  Surgeon: Regan Lemming, MD;  Location: [facility];  Service: Cardiovascular;  Laterality: N/A;   toenail removal      left great toenail   TUBAL LIGATION     Family History  Problem Relation Age of Onset   Stroke Mother    Hypertension Mother    Breast cancer Sister    Kidney failure Son    Heart disease Son        heart transplant   Other Son        open heart surgery at 69 months old   Colon cancer Neg Hx    Social History   Socioeconomic History   Marital status: Widowed    Spouse name: Not on file   Number of children: Not on file   Years of education: Not on file   Highest education level: Some college, no degree  Occupational History   Not on file  Tobacco Use   Smoking status: Never    Passive exposure: Past   Smokeless tobacco: Never  Vaping Use   Vaping status: Never Used  Substance and Sexual Activity   Alcohol use: No   Drug use: No   Sexual activity: Not Currently  Other Topics Concern   Not on file  Social History  Narrative   Widowed 2022.     Lived in [place] for 50 years.     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   Social Determinants of Health   Financial Resource Strain: Low Risk  (10/21/2022)   Overall Financial Resource Strain (CARDIA)    Difficulty of Paying Living Expenses: Not hard at all  Food Insecurity: No Food Insecurity (10/21/2022)   Hunger Vital Sign    Worried About Running Out of Food in the Last Year: Never true  Ran Out of Food in the Last Year: Never true  Transportation Needs: No Transportation Needs (10/21/2022)   PRAPARE - Administrator, Civil Service (Medical): No    Lack of Transportation (Non-Medical): No  Physical Activity: Sufficiently Active (10/21/2022)   Exercise Vital Sign    Days of Exercise per Week: 3 days    Minutes of Exercise per Session: 60 min  Stress: No Stress Concern Present (10/21/2022)   [employer] of Occupational Health - Occupational Stress Questionnaire    Feeling of Stress : Not at all  Social Connections: Moderately Integrated (10/21/2022)   Social Connection and Isolation Panel [NHANES]    Frequency of Communication with Friends and Family: More than three times a week    Frequency of Social Gatherings with Friends and Family: More than three times a week    Attends Religious Services: More than 4 times per year    Active Member of [employer] or Organizations: Yes    Attends Banker Meetings: More than 4 times per year    Marital Status: Widowed    Tobacco Counseling Counseling given: Not Answered   Clinical Intake:  Pre-visit preparation completed: Yes  Pain : No/denies pain     BMI - recorded: 23.49 Nutritional Status: BMI > 30  Obese Nutritional Risks: None Diabetes: No  How often do you need to have someone help you when you read instructions, pamphlets, or other written materials from your doctor or pharmacy?: 1 - Never  Interpreter Needed?: No  Information entered  by :: C.Ladawn Boullion LPN   Activities of Daily Living    10/21/2022   10:57 AM  In your present state of health, do you have any difficulty performing the following activities:  Hearing? 0  Vision? 0  Difficulty concentrating or making decisions? 0  Walking or climbing stairs? 0  Dressing or bathing? 0  Doing errands, shopping? 0  Preparing Food and eating ? N  Using the Toilet? N  In the past six months, have you accidently leaked urine? N  Do you have problems with loss of bowel control? N  Managing your Medications? N  Managing your Finances? N  Housekeeping or managing your Housekeeping? N    Patient Care Team: Joaquim Nam, MD as PCP - General (Family Medicine) Little Ishikawa, MD as PCP - Cardiology (Cardiology) Regan Lemming, MD as PCP - Electrophysiology (Cardiology)  Indicate any recent Medical Services you may have received from other than [facility] providers in the past year (date may be approximate).     Assessment:   This is a routine wellness examination for Palmer.  Hearing/Vision screen Hearing Screening - Comments:: Denies hearing difficulties   Vision Screening - Comments:: Readers - [facility]- ITD on eye exams   Goals Addressed             This Visit's Progress    Patient Stated       Lost some weight and tone up.       Depression Screen    10/21/2022   10:53 AM 08/09/2022   12:16 PM 05/03/2022    3:32 PM 12/28/2021   11:16 AM  PHQ 2/9 Scores  PHQ - 2 Score 0 2 0 0  PHQ- 9 Score  2 0 0    Fall Risk    10/21/2022   10:57 AM 08/09/2022   12:16 PM 05/03/2022    3:31 PM 02/18/2022    8:33 AM  Fall Risk  Falls in the past year? 0 0 0 0  Number falls in past yr: 0 0 0 0  Injury with Fall? 0 0 0 0  Risk for fall due to : No Fall Risks No Fall Risks No Fall Risks No Fall Risks  Follow up Falls prevention discussed;Falls evaluation completed Falls evaluation completed Falls evaluation completed Falls evaluation completed     MEDICARE RISK AT HOME: Medicare Risk at Home Any stairs in or around the home?: Yes If so, are there any without handrails?: No Home free of loose throw rugs in walkways, pet beds, electrical cords, etc?: Yes Adequate lighting in your home to reduce risk of falls?: Yes Life alert?: No Use of a cane, walker or w/c?: No Grab bars in the bathroom?: No Shower chair or bench in shower?: No Elevated toilet seat or a handicapped toilet?: No  TIMED UP AND GO:  Was the test performed?  No    Cognitive Function:        10/21/2022   10:59 AM  6CIT Screen  What Year? 0 points  What month? 0 points  What time? 0 points  Count back from 20 0 points  Months in reverse 0 points  Repeat phrase 0 points  Total Score 0 points    Immunizations Immunization History  Administered Date(s) Administered   COVID-19, mRNA, vaccine(Comirnaty)12 years and older 11/28/2021   Fluad Quad(high Dose 65+) 12/28/2021   Influenza Split 12/23/2003, 11/21/2006, 10/20/2007, 10/21/2008   Moderna Covid-19 Vaccine Bivalent Booster 63yrs & up 01/19/2021   Moderna Sars-Covid-2 Vaccination 03/10/2019, 04/07/2019, 01/19/2021   Pneumococcal Conjugate-13 12/24/2015   Pneumococcal Polysaccharide-23 07/21/2015, 11/07/2015, 12/19/2018, 03/15/2022   Td (Adult),5 Lf Tetanus Toxid, Preservative Free 02/21/2004    TDAP status: Due, Education has been provided regarding the importance of this vaccine. Advised may receive this vaccine at local pharmacy or Health Dept. Aware to provide a copy of the vaccination record if obtained from local pharmacy or Health Dept. Verbalized acceptance and understanding.  Flu Vaccine status: Due, Education has been provided regarding the importance of this vaccine. Advised may receive this vaccine at local pharmacy or Health Dept. Aware to provide a copy of the vaccination record if obtained from local pharmacy or Health Dept. Verbalized acceptance and understanding.  Pneumococcal  vaccine status: Up to date  Covid-19 vaccine status: Information provided on how to obtain vaccines.   Qualifies for Shingles Vaccine? Yes   Zostavax completed      Shingrix Completed?: No.    Education has been provided regarding the importance of this vaccine. Patient has been advised to call insurance company to determine out of pocket expense if they have not yet received this vaccine. Advised may also receive vaccine at local pharmacy or Health Dept. Verbalized acceptance and understanding.  Screening Tests Health Maintenance  Topic Date Due   Hepatitis C Screening  Never done   Zoster Vaccines- Shingrix (1 of 2) Never done   DTaP/Tdap/Td (1 - Tdap) 02/22/2004   INFLUENZA VACCINE  09/09/2022   COVID-19 Vaccine (6 - 2023-24 season) 10/10/2022   Medicare Annual Wellness (AWV)  10/21/2023   Pneumonia Vaccine 52+ Years old  Completed   DEXA SCAN  Completed   HPV VACCINES  Aged Out    Health Maintenance  Health Maintenance Due  Topic Date Due   Hepatitis C Screening  Never done   Zoster Vaccines- Shingrix (1 of 2) Never done   DTaP/Tdap/Td (1 - Tdap) 02/22/2004   INFLUENZA VACCINE  09/09/2022  COVID-19 Vaccine (6 - 2023-24 season) 10/10/2022    Colorectal cancer screening: No longer required.   Mammogram status: Ordered 10/21/22. Pt provided with contact info and advised to call to schedule appt.   Bone Density status: Completed 08/13/22. Results reflect: Bone density results: OSTEOPENIA. Repeat every 2 years.  Lung Cancer Screening: (Low Dose CT Chest recommended if Age 65-80 years, 20 pack-year currently smoking OR have quit w/in 15years.) does not qualify.   Lung Cancer Screening Referral:    Additional Screening:  Hepatitis C Screening: does qualify; Completed DUE  Vision Screening: Recommended annual ophthalmology exams for early detection of glaucoma and other disorders of the eye. Is the patient up to date with their annual eye exam?  Yes  Who is the provider  or what is the name of the office in which the patient attends annual eye exams? [facility] If pt is not established with a provider, would they like to be referred to a provider to establish care? Yes .   Dental Screening: Recommended annual dental exams for proper oral hygiene  Diabetic Foot Exam:   Community Resource Referral / Chronic Care Management: CRR required this visit?  No   CCM required this visit?  No     Plan:     I have personally reviewed and noted the following in the patient's chart:   Medical and social history Use of alcohol, tobacco or illicit drugs  Current medications and supplements including opioid prescriptions. Patient is not currently taking opioid prescriptions. Functional ability and status Nutritional status Physical activity Advanced directives List of other physicians Hospitalizations, surgeries, and ER visits in previous 12 months Vitals Screenings to include cognitive, depression, and falls Referrals and appointments  In addition, I have reviewed and discussed with patient certain preventive protocols, quality metrics, and best practice recommendations. A written personalized care plan for preventive services as well as general preventive health recommendations were provided to patient.     Maryan Puls, LPN   [id number]/62/[id number]   After Visit Summary: (MyChart) Due to this being a telephonic visit, the after visit summary with patients personalized plan was offered to patient via MyChart   Nurse Notes: none

## 2022-11-02 ENCOUNTER — Ambulatory Visit (INDEPENDENT_AMBULATORY_CARE_PROVIDER_SITE_OTHER): Payer: Medicare Other

## 2022-11-02 DIAGNOSIS — L639 Alopecia areata, unspecified: Secondary | ICD-10-CM | POA: Diagnosis not present

## 2022-11-02 DIAGNOSIS — I441 Atrioventricular block, second degree: Secondary | ICD-10-CM

## 2022-11-03 ENCOUNTER — Other Ambulatory Visit: Payer: Self-pay | Admitting: Internal Medicine

## 2022-11-03 LAB — CUP PACEART REMOTE DEVICE CHECK
Battery Remaining Longevity: 98 mo
Battery Remaining Percentage: 90 %
Battery Voltage: 2.99 V
Brady Statistic AP VP Percent: 41 %
Brady Statistic AP VS Percent: 1.2 %
Brady Statistic AS VP Percent: 54 %
Brady Statistic AS VS Percent: 2.7 %
Brady Statistic RA Percent Paced: 41 %
Brady Statistic RV Percent Paced: 95 %
Date Time Interrogation Session: 20240924035045
Implantable Lead Connection Status: 753985
Implantable Lead Connection Status: 753985
Implantable Lead Implant Date: 20230623
Implantable Lead Implant Date: 20230623
Implantable Lead Location: 753859
Implantable Lead Location: 753860
Implantable Pulse Generator Implant Date: 20230623
Lead Channel Impedance Value: 430 Ohm
Lead Channel Impedance Value: 430 Ohm
Lead Channel Pacing Threshold Amplitude: 0.625 V
Lead Channel Pacing Threshold Amplitude: 1.5 V
Lead Channel Pacing Threshold Pulse Width: 0.5 ms
Lead Channel Pacing Threshold Pulse Width: 0.5 ms
Lead Channel Sensing Intrinsic Amplitude: 12 mV
Lead Channel Sensing Intrinsic Amplitude: 5 mV
Lead Channel Setting Pacing Amplitude: 1.625
Lead Channel Setting Pacing Amplitude: 1.75 V
Lead Channel Setting Pacing Pulse Width: 0.5 ms
Lead Channel Setting Sensing Sensitivity: 2 mV
Pulse Gen Model: 2272
Pulse Gen Serial Number: 8092211

## 2022-11-10 ENCOUNTER — Ambulatory Visit: Payer: Medicare Other | Admitting: Pulmonary Disease

## 2022-11-10 ENCOUNTER — Encounter: Payer: Self-pay | Admitting: Pulmonary Disease

## 2022-11-10 ENCOUNTER — Other Ambulatory Visit: Payer: Self-pay | Admitting: Family Medicine

## 2022-11-10 VITALS — BP 126/72 | HR 69 | Ht 64.5 in | Wt 138.8 lb

## 2022-11-10 DIAGNOSIS — R053 Chronic cough: Secondary | ICD-10-CM

## 2022-11-10 NOTE — Telephone Encounter (Signed)
Sent. Thanks.   

## 2022-11-10 NOTE — Patient Instructions (Addendum)
I am glad you are doing well with the breathing Continue the inhaler as prescribed Continue Singulair Follow-up in 6 months

## 2022-11-10 NOTE — Telephone Encounter (Signed)
Refill request for clotrimazole (MYCELEX) 10 MG troche   LOV - 08/09/22 Next OV - not scheduled Last refill - 02/24/22 #70/0

## 2022-11-10 NOTE — Progress Notes (Signed)
[name]    [id number]    [birth year]  Primary Care Physician:Duncan, Dwana Curd, MD  Referring Physician: Joaquim Nam, MD [address] [place],  [place] [address]  Chief complaint: Consult for dyspnea, cough, asthma  HPI: 77 y.o. 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   Discussed the use of AI scribe software for clinical note transcription with the patient, who gave verbal consent to proceed.  The patient, with a history of hypertension, migraine, Raynaud's, asthma, irritable bowel syndrome, and chronic cough, presents for follow-up. She reports a chronic cough, thought to be secondary to asthma and postnasal drip, and bronchiolitis. The asthma is reportedly dormant, managed with a Stiolto inhaler. She has not had an asthma attack since her first one. She also reports a recent episode of thrush, thought to be secondary to inhaled corticosteroids. She occasionally uses an albuterol rescue inhaler, typically when she rushes or climbs stairs too quickly. She also reports ongoing sinus congestion, with recent production of pale yellow to green sputum. She has been managing this with [place] and a rinse with baby shampoo. She has a history of multiple sinus surgeries and has been told she no longer has allergies. She denies any exposure to mold, hot tubs, jacuzzis, or feather pillows or comforters at home. She has a pacemaker, placed a year ago.   Pets: No pets Occupation: Used to work as an Marketing executive Exposures: No mold,, Financial controller.  No feather pillows or comforter Smoking history: Never smoker Travel history: Previously lived in [place] Relevant family history: No significant family  history   Outpatient Encounter Medications as of 11/10/2022  Medication Sig   acetaminophen (TYLENOL) 650 MG CR tablet Take 650 mg by mouth every 8 (eight) hours as needed for pain.   albuterol (VENTOLIN HFA) 108 (90 Base) MCG/ACT inhaler Inhale 2 puffs into the lungs every 6 (six) hours as needed for wheezing or shortness of breath.   alendronate (FOSAMAX) 70 MG tablet Take 1 tablet (70 mg total) by mouth once a week. Take with a full glass of water on an empty stomach.   amLODipine (NORVASC) 10 MG tablet Take 1 tablet (10 mg total) by mouth daily.   atorvastatin (LIPITOR) 20 MG tablet TAKE 1 TABLET BY MOUTH EVERY DAY   Azelastine HCl 137 MCG/SPRAY SOLN USE 2 SPRAYS NASALLY TWICE A DAY   cetirizine (ZYRTEC ALLERGY) 10 MG tablet Take 1 tablet (10 mg total) by mouth daily. (Patient taking differently: Take 10 mg by mouth as needed.)   Chlorphen-Pseudoephed-APAP (CORICIDIN D PO) Take by mouth.   dicyclomine (BENTYL) 20 MG tablet TAKE 1 TABLET BY MOUTH THREE TIMES A DAY AS NEEDED   dorzolamide (TRUSOPT) 2 % ophthalmic solution SMARTSIG:In Eye(s)   FLUoxetine (PROZAC) 10 MG tablet Take 1 tablet (10 mg total) by mouth daily.   fluticasone (FLONASE) 50 MCG/ACT nasal spray Place 2 sprays into both nostrils daily.   GARLIC PO Take 1 tablet by mouth every other day.   guaiFENesin (MUCINEX) 600 MG 12 hr tablet Take 1 tablet (600 mg total) by mouth 2 (two) times daily as needed for to loosen phlegm or cough.   hydrocortisone 2.5 % cream Apply 1 Application topically 2 (two) times daily as needed (itching).  ipratropium (ATROVENT) 0.06 % nasal spray Place 2 sprays into both nostrils 4 (four) times daily.   ketoconazole (NIZORAL) 2 % shampoo Apply topically.   latanoprost (XALATAN) 0.005 % ophthalmic solution Place 1 drop into both eyes at bedtime.   montelukast (SINGULAIR) 10 MG tablet TAKE 1 TABLET BY MOUTH DAILY AS DIRECTED   Multiple Vitamin (MULTIVITAMIN) tablet Take 1 tablet by mouth daily.    pantoprazole (PROTONIX) 40 MG tablet TAKE 1 TABLET BY MOUTH EVERY DAY   Tiotropium Bromide-Olodaterol (STIOLTO RESPIMAT) 2.5-2.5 MCG/ACT AERS Inhale 2 puffs into the lungs daily.   UNABLE TO FIND Med Name: omega XL   valsartan (DIOVAN) 160 MG tablet Take 1 tablet (160 mg total) by mouth 2 (two) times daily.   vitamin B-12 (CYANOCOBALAMIN) 1000 MCG tablet Take 1,000 mcg by mouth daily.   vitamin C (ASCORBIC ACID) 500 MG tablet Take 500 mg by mouth daily.   Zinc 50 MG CAPS Take 1 capsule by mouth daily.   amoxicillin-clavulanate (AUGMENTIN) 875-125 MG tablet Take 1 tablet by mouth 2 (two) times daily. (Patient not taking: Reported on 11/10/2022)   No facility-administered encounter medications on file as of 11/10/2022.    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 St Jude Medical Assurity MRI dual-chamber pacemaker  08/01/2021   Symptomatic bradycardia 08/01/2021    Past Surgical History:  Procedure Laterality Date   CATARACT EXTRACTION     NASAL SINUS SURGERY     several   PACEMAKER IMPLANT N/A 07/31/2021   Procedure: PACEMAKER IMPLANT;  Surgeon: Regan Lemming, MD;  Location: [facility];  Service: Cardiovascular;  Laterality: N/A;   toenail removal      left great toenail   TUBAL LIGATION      Family History  Problem Relation Age of Onset   Stroke Mother    Hypertension Mother    Breast cancer Sister    Kidney failure Son    Heart disease Son        heart transplant   Other Son        open heart surgery at 79 months old   Colon cancer Neg Hx     Social History    Socioeconomic History   Marital status: Widowed    Spouse name: Not on file   Number of children: Not on file   Years of education: Not on file   Highest education level: Some college, no degree  Occupational History   Not on file  Tobacco Use   Smoking status: Never    Passive exposure: Past   Smokeless tobacco: Never  Vaping Use   Vaping status: Never Used  Substance and Sexual Activity   Alcohol use: No   Drug use: No   Sexual activity: Not Currently  Other Topics Concern   Not on file  Social History Narrative   Widowed 2022.     Lived in [place] for 50 years.     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   Social Determinants of Health   Financial Resource Strain: Low Risk  (  10/21/2022)   Overall Financial Resource Strain (CARDIA)    Difficulty of Paying Living Expenses: Not hard at all  Food Insecurity: No Food Insecurity (10/21/2022)   Hunger Vital Sign    Worried About Running Out of Food in the Last Year: Never true    Ran Out of Food in the Last Year: Never true  Transportation Needs: No Transportation Needs (10/21/2022)   PRAPARE - Administrator, Civil Service (Medical): No    Lack of Transportation (Non-Medical): No  Physical Activity: Sufficiently Active (10/21/2022)   Exercise Vital Sign    Days of Exercise per Week: 3 days    Minutes of Exercise per Session: 60 min  Stress: No Stress Concern Present (10/21/2022)   [employer] of Occupational Health - Occupational Stress Questionnaire    Feeling of Stress : Not at all  Social Connections: Moderately Integrated (10/21/2022)   Social Connection and Isolation Panel [NHANES]    Frequency of Communication with Friends and Family: More than three times a week    Frequency of Social Gatherings with Friends and Family: More than three times a week    Attends Religious Services: More than 4 times per year    Active Member of [employer] or Organizations: Yes    Attends Tax inspector Meetings: More than 4 times per year    Marital Status: Widowed  Intimate Partner Violence: Unknown (05/15/2021)   Received from [facility]   HITS    Physically Hurt: Not on file    Insult or Talk Down To: Not on file    Threaten Physical Harm: Not on file    Scream or Curse: Not on file    Review of systems: Review of Systems  Constitutional: Negative for fever and chills.  HENT: Negative.   Eyes: Negative for blurred vision.  Respiratory: as per HPI  Cardiovascular: Negative for chest pain and palpitations.  Gastrointestinal: Negative for vomiting, diarrhea, blood per rectum. Genitourinary: Negative for dysuria, urgency, frequency and hematuria.  Musculoskeletal: Negative for myalgias, back pain and joint pain.  Skin: Negative for itching and rash.  Neurological: Negative for dizziness, tremors, focal weakness, seizures and loss of consciousness.  Endo/Heme/Allergies: Negative for environmental allergies.  Psychiatric/Behavioral: Negative for depression, suicidal ideas and hallucinations.  All other systems reviewed and are negative.  Physical Exam: Blood pressure 126/72, pulse 69, height 5' 4.5" (1.638 m), weight 138 lb 12.8 oz (63 kg), SpO2 98%. Gen:      No acute distress HEENT:  EOMI, sclera anicteric Neck:     No masses; no thyromegaly Lungs:    Clear to auscultation bilaterally; normal respiratory effort CV:         Regular rate and rhythm; no murmurs Abd:      + bowel sounds; soft, non-tender; no palpable masses, no distension Ext:    No edema; adequate peripheral perfusion Skin:      Warm and dry; no rash Neuro: alert and oriented x 3 Psych: normal mood and affect  Data Reviewed: Imaging: CT chest 06/17/2022-  Stable 6 mm nodule for 2 years. Diffuse bronchial thickening with subsegmental bronchiectasis, bronchial plugging, tree-in-bud interstitial consolidation in the right middle lobe and lower lobes.  I have reviewed the images  personally.  PFTs: 09/26/2022 Moderate diffusion impairment  Labs: CBC 09/01/2022 WBC 9.0, eosinophils 1.7%, absolute eosinophil count 153 IgE 04/05/2022-2 CCP, rheumatoid factor 08/12/2022-negative  Assessment and Plan Chronic Cough Likely secondary to asthma and postnasal drip. Asthma is well-controlled  with Stiolto inhaler. Recent thrush due to inhaled corticosteroids. -Continue Stiolto inhaler. -Address thrush with appropriate antifungal medication.  Sinus Congestion Reports sinus pressure and discharge. Previous sputum culture in August grew Pseudomonas and was treated with Augmentin. -Continue current sinus care regimen including nasal rinses -Consider repeat sputum culture if symptoms persist.  Asthma Well-controlled with Stiolto inhaler. No recent asthma attacks. -Continue Stiolto inhaler.  Follow-up in 6 months.   Recommendations: Continue Stiolto, Singulair  Chilton Greathouse MD [facility] 11/10/2022, 5:23 PM  CC: Joaquim Nam, MD

## 2022-11-19 NOTE — Progress Notes (Signed)
Remote pacemaker transmission.   

## 2022-11-23 ENCOUNTER — Other Ambulatory Visit: Payer: Self-pay | Admitting: Family Medicine

## 2022-11-23 DIAGNOSIS — K582 Mixed irritable bowel syndrome: Secondary | ICD-10-CM

## 2022-11-23 DIAGNOSIS — K589 Irritable bowel syndrome without diarrhea: Secondary | ICD-10-CM

## 2022-11-29 ENCOUNTER — Telehealth: Payer: Self-pay | Admitting: Pulmonary Disease

## 2022-11-29 NOTE — Telephone Encounter (Signed)
Called and spoke to patient.  She is requesting an order for flutter device. According to our records, a flutter device was order 04/24. Patient stated that she received flutter device but it is no longer fluttering.  I instructed patient on how to adjust dial. She adjusted dial and device is now working.  Nothing further needed

## 2022-11-29 NOTE — Telephone Encounter (Signed)
Patient would like a flapper. Patient phone number is [phone].

## 2022-12-07 ENCOUNTER — Other Ambulatory Visit: Payer: Self-pay

## 2022-12-07 ENCOUNTER — Telehealth: Payer: Self-pay

## 2022-12-07 ENCOUNTER — Encounter: Payer: Self-pay | Admitting: Internal Medicine

## 2022-12-07 ENCOUNTER — Ambulatory Visit: Payer: Medicare Other | Admitting: Internal Medicine

## 2022-12-07 VITALS — BP 102/76 | HR 78 | Temp 98.1°F | Resp 18 | Ht 65.0 in | Wt 140.5 lb

## 2022-12-07 DIAGNOSIS — J690 Pneumonitis due to inhalation of food and vomit: Secondary | ICD-10-CM | POA: Diagnosis not present

## 2022-12-07 DIAGNOSIS — J4540 Moderate persistent asthma, uncomplicated: Secondary | ICD-10-CM

## 2022-12-07 DIAGNOSIS — L309 Dermatitis, unspecified: Secondary | ICD-10-CM

## 2022-12-07 DIAGNOSIS — B370 Candidal stomatitis: Secondary | ICD-10-CM | POA: Diagnosis not present

## 2022-12-07 DIAGNOSIS — J310 Chronic rhinitis: Secondary | ICD-10-CM

## 2022-12-07 NOTE — Patient Instructions (Signed)
Moderate Persistent Asthma: managed by Pulmonary - Controller Inhaler: Continue Stiolto 2 puffs once a day; This Should Be Used Everyday - Rinse mouth out after use - Continue Singulair (Montelukast) 10mg  nightly. - Rescue Inhaler: Albuterol (Proair/Ventolin) 2 puffs . Use  every 4-6 hours as needed for chest tightness, wheezing, or coughing.  Can also use 15 minutes prior to exercise if you have symptoms with activity. - Asthma is not controlled if:  - Symptoms are occurring >2 times a week OR  - >2 times a month nighttime awakenings  - You are requiring systemic steroids (prednisone/steroid injections) more than once per year  - Your require hospitalization for your asthma.  - Please call the clinic to schedule a follow up if these symptoms arise  Concern for Aspiration Pneumonitis  - Will get barium swallow for further evaluation   Chronic rhinitis-nonallergic -allergy skin testing was previously negative, intradermals also negative - Continue Astelin (Azelastine) 1-2 sprays in each nostril twice a day as needed.  You may use this as needed for nasal congestion/itchy ears/itchy nose if desired - continue Flonase 1-2 sprays, each nostril daily as needed - Continue sinus rinses daily  Dont add budesonide to sinus rinses -  Continue  Atrovent (ipatopium) nasal spray 1-2 sprays in each nostril up to three times daily AS NEEDED for POST NASAL DRIP/RUNNY NOSE/DRAINAGE.  If you become too dry, use less often.  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  - Continue to follow up with ENT  Follow up:  we will contact you with barium swallow and next steps   Thank you so much for  letting me partake in your care today.  Don't hesitate to reach out if you have any additional concerns!  Ferol Luz, MD  [facility] [place], [place]

## 2022-12-07 NOTE — Telephone Encounter (Signed)
[facility] # [id number]

## 2022-12-07 NOTE — Progress Notes (Signed)
Follow Up Note  RE: [name] MRN: [id number] DOB: 1945/09/11 Date of Office Visit: 12/07/2022  Referring provider: Joaquim Nam, MD Primary care provider: Joaquim Nam, MD  Chief Complaint: Follow-up, Cough, and Sinus Problem (6 month check in, has sinus ha as of this morning, feels tickle down throat, new inhaler from pulmonologyst- stiolto respimat 2.5/2.5. pacemaker doing well 1 yr ago as of june)  History of Present Illness: I had the pleasure of seeing [name] for a follow up visit at the [facility] on 12/07/2022. She is a 77 y.o. female, who is being followed for asthma, chronic rhinitis, chronic sinusitis. Her previous allergy office visit was on 06/07/22 with Dr. Marlynn Perking. Today is a regular follow up visit.  History obtained from patient, chart review.  Since last visit she is maintained asthma management with Stiolto.  Chest CT on 06/17/2022 showed diffuse bronchial thickening, tree-in-bud interstitial consolidation in right middle lobe and right greater than left lower lobe.  Concern for aspiration pneumonia.  No recurrence of thrush.  She continue to complain of persistent sinus congestion.  Treated for Pseudomonas sinus infection with Augmentin in August.   Today she reports  . They have been using a Stiolto inhaler as prescribed by their pulmonologist. However, they have experienced instances of food 'going down the wrong pipe,' suggesting possible aspiration/choking.  Did not discuss this with pulm.  No prior swallow evaluations.    The patient's coughing is thought to be multifactorial, potentially due to asthma, aspiration, and upper airway issues. They have been using Albuterol, but not frequently. The patient reports needing it after exertion, such as exercise or climbing stairs.  For sinus congestion, the patient has been using Astelin and Flonase, along with a four-hour sinus pill which she cannot recall name of. They also perform  a daily sinus rinse. Despite these measures, they recently developed thrush, for which they have been taking medication (fluconazole?) which had been prescribed by pulm per patient.   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  Has been on Symbicort for many years.  Never hospitalized due to asthma.  Following up with a pulmonologist for asthma management, but has not established care.  -Spirometry showed moderate obstructive disease with significant bronchodilator response (73% ratio, FEV1 57% on pre and 65% on post) - Chest CT: 5/24: diffuse bronchial thickening in lower lungs, tree in bud interstitial solidation in RML, R>L LL concerning for aspiration pneumonititis Chronic Rhinitis: moved from [place] [facility] about one year ago due to unexpected loss of husband. Has itchy eyes since March of 2023, using steroid eye drops and pataday without relief. Has not been evaluated by ophtalmologist.  Clarinex has not been helpful for her.  She was receiving AIT at [facility] but is transferring care.  Had done a course of AIT many years ago while living in [place] Hx of sinus surgery years ago. Follows with ENT for chronic sinusitis. - SPT and IDs on 11/09/21 negative Dermatitis red itchy rash on her face, following up with dermatology.  Has history of eczema and rosacea treated in the past with mometasone, Elocon, metronidazole gel.   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 female with: Aspiration pneumonitis (HCC) - Plan: DG ESOPHAGUS W SINGLE CM (SOL OR THIN BA)  Moderate persistent asthma without complication - Plan: Spirometry with Graph  Nonallergic rhinitis  Facial  dermatitis  Oral thrush   Plan: Patient Instructions  Moderate Persistent Asthma: managed by Pulmonary - Controller Inhaler: Continue Stiolto 2 puffs once a day; This Should Be Used Everyday - Rinse mouth out after use - Continue Singulair (Montelukast) 10mg  nightly. - Rescue Inhaler: Albuterol (Proair/Ventolin) 2 puffs . Use  every 4-6 hours as needed for chest tightness, wheezing, or coughing.  Can also use 15 minutes prior to exercise if you have symptoms with activity. - Asthma is not controlled if:  - Symptoms are occurring >2 times a week OR  - >2 times a month nighttime awakenings  - You are requiring systemic steroids (prednisone/steroid injections) more than once per year  - Your require hospitalization for your asthma.  - Please call the clinic to schedule a follow up if these symptoms arise  Concern for Aspiration Pneumonitis  - Will get barium swallow for further evaluation   Chronic rhinitis-nonallergic -allergy skin testing was previously negative, intradermals also negative - Continue Astelin (Azelastine) 1-2 sprays in each nostril twice a day as needed.  You may use this as needed for nasal congestion/itchy ears/itchy nose if desired - continue Flonase 1-2 sprays, each nostril daily as needed - Continue sinus rinses daily  Dont add budesonide to sinus rinses -  Continue  Atrovent (ipatopium) nasal spray 1-2 sprays in each nostril up to three times daily AS NEEDED for POST NASAL DRIP/RUNNY NOSE/DRAINAGE.  If you become too dry, use less often.  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  - Continue to follow up with ENT  Follow up:  we will contact you with  barium swallow and next steps   Thank you so much for letting me partake in your care today.  Don't hesitate to reach out if you have any additional concerns!  Ferol Luz, MD  [facility] [place]     No orders of the defined types were placed in this encounter.   Lab Orders  No laboratory test(s) ordered today   Diagnostics: Spirometry:  Tracings reviewed. Her effort: Good reproducible efforts. FVC: 1.56 L FEV1: 1.01 L, 58% predicted FEV1/FVC ratio: 65% Interpretation:  Not obstructive ratio, reduced FEV1 and FVC. Marland Kitchen  After 4 puffs of albuterol FEV1 increased by 7% and 70 cc, FVC increased by 5% and 80 cc.  There is not significant postbronchodilator response Please see scanned spirometry results for details.   Medication List:  Current Outpatient Medications  Medication Sig Dispense Refill   acetaminophen (TYLENOL) 650 MG CR tablet Take 650 mg by mouth every 8 (eight) hours as needed for pain.     albuterol (VENTOLIN HFA) 108 (90 Base) MCG/ACT inhaler Inhale 2 puffs into the lungs every 6 (six) hours as needed for wheezing or shortness of breath. 8 g 11   alendronate (FOSAMAX) 70 MG tablet Take 1 tablet (70 mg total) by mouth once a week. Take with a full glass of water on an empty stomach. 13 tablet 3   amLODipine (NORVASC)  10 MG tablet Take 1 tablet (10 mg total) by mouth daily. 90 tablet 3   atorvastatin (LIPITOR) 20 MG tablet TAKE 1 TABLET BY MOUTH EVERY DAY 90 tablet 1   Azelastine HCl 137 MCG/SPRAY SOLN USE 2 SPRAYS NASALLY TWICE A DAY 90 mL 0   cetirizine (ZYRTEC ALLERGY) 10 MG tablet Take 1 tablet (10 mg total) by mouth daily. (Patient taking differently: Take 10 mg by mouth as needed.) 30 tablet 5   Chlorphen-Pseudoephed-APAP (CORICIDIN D PO) Take by mouth.     clotrimazole (MYCELEX) 10 MG troche Take 1 tablet (10 mg total) by mouth 5 (five) times daily. Repeat course if needed 70 Troche 0   dicyclomine (BENTYL) 20 MG tablet TAKE 1 TABLET BY  MOUTH THREE TIMES A DAY AS NEEDED 270 tablet 1   dorzolamide (TRUSOPT) 2 % ophthalmic solution SMARTSIG:In Eye(s)     FLUoxetine (PROZAC) 10 MG tablet Take 1 tablet (10 mg total) by mouth daily. 90 tablet 3   fluticasone (FLONASE) 50 MCG/ACT nasal spray Place 2 sprays into both nostrils daily. 16 g 11   GARLIC PO Take 1 tablet by mouth every other day.     guaiFENesin (MUCINEX) 600 MG 12 hr tablet Take 1 tablet (600 mg total) by mouth 2 (two) times daily as needed for to loosen phlegm or cough. 60 tablet 1   hydrocortisone 2.5 % cream Apply 1 Application topically 2 (two) times daily as needed (itching).     ipratropium (ATROVENT) 0.06 % nasal spray Place 2 sprays into both nostrils 4 (four) times daily. 15 mL 12   ketoconazole (NIZORAL) 2 % shampoo Apply topically.     latanoprost (XALATAN) 0.005 % ophthalmic solution Place 1 drop into both eyes at bedtime.     montelukast (SINGULAIR) 10 MG tablet TAKE 1 TABLET BY MOUTH DAILY AS DIRECTED 90 tablet 1   Multiple Vitamin (MULTIVITAMIN) tablet Take 1 tablet by mouth daily.     pantoprazole (PROTONIX) 40 MG tablet TAKE 1 TABLET BY MOUTH EVERY DAY 90 tablet 1   Tiotropium Bromide-Olodaterol (STIOLTO RESPIMAT) 2.5-2.5 MCG/ACT AERS Inhale 2 puffs into the lungs daily. 4 g 11   UNABLE TO FIND Med Name: omega XL     valsartan (DIOVAN) 160 MG tablet Take 1 tablet (160 mg total) by mouth 2 (two) times daily. 180 tablet 2   vitamin B-12 (CYANOCOBALAMIN) 1000 MCG tablet Take 1,000 mcg by mouth daily.     vitamin C (ASCORBIC ACID) 500 MG tablet Take 500 mg by mouth daily.     Zinc 50 MG CAPS Take 1 capsule by mouth daily.     No current facility-administered medications for this visit.   Allergies: Allergies  Allergen Reactions   Azithromycin     Other reaction(s): gi distress, GI intolerance, Other   Levofloxacin Hives and Rash   Sulfa Antibiotics Hives    Red patches on legs years ago    Ciprofloxacin     rash   I reviewed her past medical  history, social history, family history, and environmental history and no significant changes have been reported from her previous visit.  ROS: All others negative except as noted per HPI.   Objective: BP 102/76 (BP Location: Left Arm, Patient Position: Sitting, Cuff Size: Normal)   Pulse 78   Temp 98.1 F (36.7 C) (Temporal)   Resp 18   Ht 5\' 5"  (1.651 m)   Wt 140 lb 8 oz (63.7 kg)   SpO2 97%   BMI  23.38 kg/m  Body mass index is 23.38 kg/m. General Appearance:  Alert, cooperative, no distress, appears stated age  Head:  Normocephalic, without obvious abnormality, atraumatic  Eyes:  Conjunctiva clear, EOM's intact  Nose: Nares normal,  erythematous nasal mucosa, scant clear rhinnorhea, no visible anterior polyps, and septum midline  Throat: Lips, tongue normal; teeth and gums normal, + cobblestoning  Neck: Supple, symmetrical  Lungs:   clear to auscultation bilaterally, Respirations unlabored, no coughing  Heart:  regular rate and rhythm and no murmur, Appears well perfused  Extremities: No edema  Skin: Skin color, texture, turgor normal, no rashes or lesions on visualized portions of skin  Neurologic: No gross deficits   Previous notes and tests were reviewed. The plan was reviewed with the patient/family, and all questions/concerned were addressed.  It was my pleasure to see Adrienna today and participate in her care. Please feel free to contact me with any questions or concerns.  Sincerely,  Ferol Luz, MD  Allergy & Immunology  [facility] Office: [phone]

## 2022-12-07 NOTE — Telephone Encounter (Signed)
Spoke to insurance [phone] [name] , transferred to [facility] [phone] barium swallow cpt code  29562 does NOT  need prior auth can be sheduled at any contracted facility. Reference #[id number]  Called [facility] they do this at [place] location Provider can put order in epic they call patient Place order with or without pill

## 2022-12-08 NOTE — Telephone Encounter (Signed)
[facility] called and stated they are unable to schedule the barium swallow, it needs to be a modified barium for aspiration and that should be scheduled at the hospital.

## 2022-12-09 DIAGNOSIS — H53413 Scotoma involving central area, bilateral: Secondary | ICD-10-CM | POA: Diagnosis not present

## 2022-12-09 DIAGNOSIS — H4010X4 Unspecified open-angle glaucoma, indeterminate stage: Secondary | ICD-10-CM | POA: Diagnosis not present

## 2022-12-09 DIAGNOSIS — H5212 Myopia, left eye: Secondary | ICD-10-CM | POA: Diagnosis not present

## 2022-12-09 DIAGNOSIS — H524 Presbyopia: Secondary | ICD-10-CM | POA: Diagnosis not present

## 2022-12-09 DIAGNOSIS — H35033 Hypertensive retinopathy, bilateral: Secondary | ICD-10-CM | POA: Diagnosis not present

## 2022-12-09 DIAGNOSIS — H26491 Other secondary cataract, right eye: Secondary | ICD-10-CM | POA: Diagnosis not present

## 2022-12-09 NOTE — Progress Notes (Deleted)
Office Visit Note  Patient: [name]             Date of Birth: [birth year]           MRN: [id number]             PCP: Joaquim Nam, MD Referring: Joaquim Nam, MD Visit Date: 12/16/2022 Occupation: @GUAROCC @  Subjective:    History of Present Illness: [name] is a 77 y.o. female with history of osteoarthritis.   DEXA updated on 08/13/22: LFN T-score -1.7. RFN -1.3.  Vitamin D WNL-41 on 09/01/22.  Reviewed chest CT results from 06/17/2022 which revealed chronic T3 compression fracture and a chronic L2 compression deformity.  We can further discuss treatment options in the future.  She is currently taking Fosamax 70 mg 1 tablet by mouth once weekly.   History of vertebral fracture: Upon chart review of the chest CT from 06/17/2022 noted chronic severe T3 compression fracture and a new upper plate compression deformity of the L2 vertebral body with about 50% central height loss--chronic.    Activities of Daily Living:  Patient reports morning stiffness for *** {minute/hour:19697}.   Patient {ACTIONS;DENIES/REPORTS:21021675::"Denies"} nocturnal pain.  Difficulty dressing/grooming: {ACTIONS;DENIES/REPORTS:21021675::"Denies"} Difficulty climbing stairs: {ACTIONS;DENIES/REPORTS:21021675::"Denies"} Difficulty getting out of chair: {ACTIONS;DENIES/REPORTS:21021675::"Denies"} Difficulty using hands for taps, buttons, cutlery, and/or writing: {ACTIONS;DENIES/REPORTS:21021675::"Denies"}  No Rheumatology ROS completed.   PMFS History:  Patient Active Problem List   Diagnosis Date Noted   Chronic cough 09/15/2022   Post-nasal drip 09/15/2022   Pulmonary nodule 09/15/2022   Sinusitis 02/21/2022   Advance care planning 12/30/2021   HLD (hyperlipidemia) 12/30/2021   Osteopenia 12/30/2021   Uveitis 12/28/2021   PVD (posterior vitreous detachment) 12/28/2021   Hypertensive retinopathy 12/28/2021   Glaucoma 12/28/2021   Acid reflux 12/28/2021   Status post placement of  cardiac pacemaker 07/31/21 St Jude Medical Assurity MRI dual-chamber pacemaker  08/01/2021   Symptomatic bradycardia 08/01/2021   HTN (hypertension) 08/01/2021   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   Anxiety 04/22/2020   Lumbar sprain 04/11/2017   Subluxation of patellofemoral joint 08/26/2016   Osteoarthritis of knee 08/26/2016   Migraine without status migrainosus, not intractable 10/30/2013   Asthma 01/01/2008   Depression 01/08/2003   Raynaud's syndrome without gangrene 01/26/2002   Irritable bowel syndrome 03/20/1999    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 St Jude Medical Assurity MRI dual-chamber pacemaker  08/01/2021   Symptomatic bradycardia 08/01/2021    Family History  Problem Relation Age of Onset   Stroke Mother    Hypertension Mother    Breast cancer Sister    Kidney failure Son    Heart disease Son        heart transplant   Other Son        open heart surgery at 33 months old   Colon cancer Neg Hx    Past Surgical History:  Procedure Laterality Date   CATARACT EXTRACTION     NASAL SINUS SURGERY     several   PACEMAKER IMPLANT N/A 07/31/2021   Procedure: PACEMAKER IMPLANT;  Surgeon: Regan Lemming, MD;  Location: [facility] INVASIVE [facility] LAB;  Service: Cardiovascular;  Laterality: N/A;   toenail removal      left great toenail   TUBAL LIGATION     Social History  Social History Narrative   Widowed 2022.     Lived in [place] for 50 years.     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   Immunization History  Administered Date(s) Administered   Fluad Quad(high Dose 65+) 12/28/2021   Fluad Trivalent(High Dose 65+) 10/22/2022   Influenza Split 12/23/2003, 11/21/2006, 10/20/2007, 10/21/2008   Moderna Covid-19 Vaccine Bivalent Booster 66yrs & up 01/19/2021   Moderna Sars-Covid-2  Vaccination 03/10/2019, 04/07/2019, 01/19/2021   Pfizer(Comirnaty)Fall Seasonal Vaccine 12 years and older 11/28/2021, 10/22/2022   Pneumococcal Conjugate-13 12/24/2015   Pneumococcal Polysaccharide-23 07/21/2015, 11/07/2015, 12/19/2018, 03/15/2022   Td (Adult),5 Lf Tetanus Toxid, Preservative Free 02/21/2004     Objective: Vital Signs: There were no vitals taken for this visit.   Physical Exam Vitals and nursing note reviewed.  Constitutional:      Appearance: She is well-developed.  HENT:     Head: Normocephalic and atraumatic.  Eyes:     Conjunctiva/sclera: Conjunctivae normal.  Cardiovascular:     Rate and Rhythm: Normal rate and regular rhythm.     Heart sounds: Normal heart sounds.  Pulmonary:     Effort: Pulmonary effort is normal.     Breath sounds: Normal breath sounds.  Abdominal:     General: Bowel sounds are normal.     Palpations: Abdomen is soft.  Musculoskeletal:     Cervical back: Normal range of motion.  Lymphadenopathy:     Cervical: No cervical adenopathy.  Skin:    General: Skin is warm and dry.     Capillary Refill: Capillary refill takes less than 2 seconds.  Neurological:     Mental Status: She is alert and oriented to person, place, and time.  Psychiatric:        Behavior: Behavior normal.      Musculoskeletal Exam: ***  CDAI Exam: CDAI Score: -- Patient Global: --; Provider Global: -- Swollen: --; Tender: -- Joint Exam 12/16/2022   No joint exam has been documented for this visit   There is currently no information documented on the homunculus. Go to the Rheumatology activity and complete the homunculus joint exam.  Investigation: No additional findings.  Imaging: No results found.  Recent Labs: Lab Results  Component Value Date   WBC 9.0 09/01/2022   HGB 12.2 09/01/2022   PLT 258 09/01/2022   NA 138 09/01/2022   K 4.7 09/01/2022   CL 102 09/01/2022   CO2 30 09/01/2022   GLUCOSE 89 09/01/2022   BUN 13 09/01/2022    CREATININE 0.76 09/01/2022   BILITOT 0.3 09/01/2022   ALKPHOS 38 04/03/2020   AST 23 09/01/2022   ALT 18 09/01/2022   PROT 7.9 09/01/2022   ALBUMIN 3.0 (L) 04/03/2020   CALCIUM 9.7 09/01/2022   GFRAA  10/02/2006    >60        The eGFR has been calculated using the MDRD equation. This calculation has not been validated in all clinical    Speciality Comments: No specialty comments available.  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  No orders of the defined types were placed in this encounter.   Face-to-face time spent with patient was *** minutes. Greater than 50% of time was spent in counseling and coordination of care.  Follow-Up Instructions: No follow-ups on file.   Gearldine Bienenstock, PA-C  Note - This record has been created using Dragon software.  Chart creation errors have been sought, but may not always  have been located. Such creation errors do not reflect on  the standard of medical care.

## 2022-12-14 DIAGNOSIS — L639 Alopecia areata, unspecified: Secondary | ICD-10-CM | POA: Diagnosis not present

## 2022-12-16 ENCOUNTER — Ambulatory Visit: Payer: Medicare Other | Admitting: Physician Assistant

## 2022-12-16 DIAGNOSIS — Z8639 Personal history of other endocrine, nutritional and metabolic disease: Secondary | ICD-10-CM

## 2022-12-16 DIAGNOSIS — I10 Essential (primary) hypertension: Secondary | ICD-10-CM

## 2022-12-16 DIAGNOSIS — M19042 Primary osteoarthritis, left hand: Secondary | ICD-10-CM

## 2022-12-16 DIAGNOSIS — M19071 Primary osteoarthritis, right ankle and foot: Secondary | ICD-10-CM

## 2022-12-16 DIAGNOSIS — Z8781 Personal history of (healed) traumatic fracture: Secondary | ICD-10-CM

## 2022-12-16 DIAGNOSIS — Z950 Presence of cardiac pacemaker: Secondary | ICD-10-CM

## 2022-12-16 DIAGNOSIS — Z8719 Personal history of other diseases of the digestive system: Secondary | ICD-10-CM

## 2022-12-16 DIAGNOSIS — F419 Anxiety disorder, unspecified: Secondary | ICD-10-CM

## 2022-12-16 DIAGNOSIS — R053 Chronic cough: Secondary | ICD-10-CM

## 2022-12-16 DIAGNOSIS — I4430 Unspecified atrioventricular block: Secondary | ICD-10-CM

## 2022-12-16 DIAGNOSIS — Z8709 Personal history of other diseases of the respiratory system: Secondary | ICD-10-CM

## 2022-12-16 DIAGNOSIS — Z8669 Personal history of other diseases of the nervous system and sense organs: Secondary | ICD-10-CM

## 2022-12-16 DIAGNOSIS — R001 Bradycardia, unspecified: Secondary | ICD-10-CM

## 2022-12-16 DIAGNOSIS — M8589 Other specified disorders of bone density and structure, multiple sites: Secondary | ICD-10-CM

## 2022-12-16 DIAGNOSIS — I7300 Raynaud's syndrome without gangrene: Secondary | ICD-10-CM

## 2022-12-21 ENCOUNTER — Telehealth: Payer: Self-pay | Admitting: Cardiology

## 2022-12-21 ENCOUNTER — Other Ambulatory Visit: Payer: Self-pay | Admitting: Internal Medicine

## 2022-12-21 DIAGNOSIS — J690 Pneumonitis due to inhalation of food and vomit: Secondary | ICD-10-CM

## 2022-12-21 NOTE — Telephone Encounter (Signed)
Patient would like to discuss any restrictions for exercising. Please advise.

## 2022-12-21 NOTE — Telephone Encounter (Signed)
Done.  Can we check and see if she has been able to schedule?

## 2022-12-21 NOTE — Telephone Encounter (Signed)
Left voicemail to return call to office.

## 2022-12-21 NOTE — Telephone Encounter (Signed)
Dosent appear pt is scheduled yet

## 2022-12-22 DIAGNOSIS — H401131 Primary open-angle glaucoma, bilateral, mild stage: Secondary | ICD-10-CM | POA: Diagnosis not present

## 2022-12-22 NOTE — Telephone Encounter (Signed)
Spoke to pt. Aware from EP standpoint & PPM standpoint -- she has NO restrictions. Patient verbalized understanding and agreeable to plan.

## 2022-12-29 ENCOUNTER — Other Ambulatory Visit: Payer: Self-pay

## 2022-12-29 DIAGNOSIS — J690 Pneumonitis due to inhalation of food and vomit: Secondary | ICD-10-CM

## 2022-12-29 NOTE — Telephone Encounter (Signed)
Called Ginette Otto [phone], Due to diagnosis it needs to be at hospital book at [facility] swallow function speech function test medicare. Patients preferred location [address] [address]. Called [facility] [phone] [address] in [place]. Spoke to [place] she states I need to call Lorelle Formosa at speech [phone] left message and central scheduling [phone] no answer x 2. Patient schedule is open

## 2022-12-29 NOTE — Telephone Encounter (Signed)
Barrium swallow order placed for [facility] patient will call [phone]

## 2022-12-29 NOTE — Addendum Note (Signed)
Addended by: Lynnae Sandhoff, Shadonna Benedick E on: 12/29/2022 02:40 PM   Modules accepted: Orders

## 2022-12-30 NOTE — Telephone Encounter (Signed)
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 She is aware of pt's dpr to leave details on cell with any details, date and time.

## 2022-12-31 NOTE — Telephone Encounter (Signed)
Patient scheduled for 01/18/2023.

## 2022-12-31 NOTE — Telephone Encounter (Signed)
Thank you for all your hard work with this!!

## 2023-01-04 DIAGNOSIS — J329 Chronic sinusitis, unspecified: Secondary | ICD-10-CM | POA: Diagnosis not present

## 2023-01-04 DIAGNOSIS — J310 Chronic rhinitis: Secondary | ICD-10-CM | POA: Diagnosis not present

## 2023-01-04 DIAGNOSIS — K219 Gastro-esophageal reflux disease without esophagitis: Secondary | ICD-10-CM | POA: Diagnosis not present

## 2023-01-13 ENCOUNTER — Other Ambulatory Visit: Payer: Self-pay | Admitting: Internal Medicine

## 2023-01-13 DIAGNOSIS — N3001 Acute cystitis with hematuria: Secondary | ICD-10-CM | POA: Diagnosis not present

## 2023-01-18 ENCOUNTER — Ambulatory Visit (HOSPITAL_COMMUNITY)
Admission: RE | Admit: 2023-01-18 | Discharge: 2023-01-18 | Disposition: A | Discharge status: Home / Self Care | Payer: Medicare Other | Source: Ambulatory Visit | Attending: Internal Medicine | Admitting: Internal Medicine

## 2023-01-18 DIAGNOSIS — R638 Other symptoms and signs concerning food and fluid intake: Secondary | ICD-10-CM | POA: Diagnosis not present

## 2023-01-18 DIAGNOSIS — J690 Pneumonitis due to inhalation of food and vomit: Secondary | ICD-10-CM

## 2023-01-18 DIAGNOSIS — R1310 Dysphagia, unspecified: Secondary | ICD-10-CM | POA: Insufficient documentation

## 2023-01-18 DIAGNOSIS — R059 Cough, unspecified: Secondary | ICD-10-CM | POA: Diagnosis not present

## 2023-01-19 ENCOUNTER — Other Ambulatory Visit: Payer: Self-pay | Admitting: Family Medicine

## 2023-01-25 DIAGNOSIS — L639 Alopecia areata, unspecified: Secondary | ICD-10-CM | POA: Diagnosis not present

## 2023-01-25 DIAGNOSIS — L309 Dermatitis, unspecified: Secondary | ICD-10-CM | POA: Diagnosis not present

## 2023-02-01 ENCOUNTER — Ambulatory Visit (INDEPENDENT_AMBULATORY_CARE_PROVIDER_SITE_OTHER): Payer: Medicare Other

## 2023-02-01 DIAGNOSIS — I441 Atrioventricular block, second degree: Secondary | ICD-10-CM

## 2023-02-03 LAB — CUP PACEART REMOTE DEVICE CHECK
Battery Remaining Longevity: 94 mo
Battery Remaining Percentage: 87 %
Battery Voltage: 2.99 V
Brady Statistic AP VP Percent: 43 %
Brady Statistic AP VS Percent: 1.1 %
Brady Statistic AS VP Percent: 53 %
Brady Statistic AS VS Percent: 2.3 %
Brady Statistic RA Percent Paced: 43 %
Brady Statistic RV Percent Paced: 96 %
Date Time Interrogation Session: 20241224020028
Implantable Lead Connection Status: 753985
Implantable Lead Connection Status: 753985
Implantable Lead Implant Date: 20230623
Implantable Lead Implant Date: 20230623
Implantable Lead Location: 753859
Implantable Lead Location: 753860
Implantable Pulse Generator Implant Date: 20230623
Lead Channel Impedance Value: 410 Ohm
Lead Channel Impedance Value: 430 Ohm
Lead Channel Pacing Threshold Amplitude: 0.625 V
Lead Channel Pacing Threshold Amplitude: 1.75 V
Lead Channel Pacing Threshold Pulse Width: 0.5 ms
Lead Channel Pacing Threshold Pulse Width: 0.5 ms
Lead Channel Sensing Intrinsic Amplitude: 11.8 mV
Lead Channel Sensing Intrinsic Amplitude: 4.6 mV
Lead Channel Setting Pacing Amplitude: 1.625
Lead Channel Setting Pacing Amplitude: 2 V
Lead Channel Setting Pacing Pulse Width: 0.5 ms
Lead Channel Setting Sensing Sensitivity: 2 mV
Pulse Gen Model: 2272
Pulse Gen Serial Number: 8092211

## 2023-03-08 DIAGNOSIS — L639 Alopecia areata, unspecified: Secondary | ICD-10-CM | POA: Diagnosis not present

## 2023-03-08 DIAGNOSIS — K130 Diseases of lips: Secondary | ICD-10-CM | POA: Diagnosis not present

## 2023-03-11 ENCOUNTER — Ambulatory Visit (INDEPENDENT_AMBULATORY_CARE_PROVIDER_SITE_OTHER): Payer: Medicare Other | Admitting: Family Medicine

## 2023-03-11 VITALS — BP 130/80 | HR 68 | Temp 98.0°F | Ht 64.5 in | Wt 143.0 lb

## 2023-03-11 DIAGNOSIS — R053 Chronic cough: Secondary | ICD-10-CM

## 2023-03-11 DIAGNOSIS — J4540 Moderate persistent asthma, uncomplicated: Secondary | ICD-10-CM | POA: Diagnosis not present

## 2023-03-11 DIAGNOSIS — J4541 Moderate persistent asthma with (acute) exacerbation: Secondary | ICD-10-CM

## 2023-03-11 LAB — POC COVID19 BINAXNOW: SARS Coronavirus 2 Ag: NEGATIVE

## 2023-03-11 LAB — POC INFLUENZA A&B (BINAX/QUICKVUE)
Influenza A, POC: NEGATIVE
Influenza B, POC: NEGATIVE

## 2023-03-11 NOTE — Assessment & Plan Note (Signed)
Chronic, no clear acute exacerbation.

## 2023-03-11 NOTE — Progress Notes (Signed)
Patient ID: [name], female    DOB: 1945/03/05, 78 y.o.   MRN: [id number]  This visit was conducted in person.  BP 130/80 (BP Location: Left Arm, Patient Position: Sitting, Cuff Size: Normal)   Pulse 68   Temp 98 F (36.7 C) (Temporal)   Ht 5' 4.5" (1.638 m)   Wt 143 lb (64.9 kg)   SpO2 96%   BMI 24.17 kg/m    CC:  Chief Complaint  Patient presents with   Cough    X months   Sinus Pressure    Subjective:   HPI: [name] is a 78 y.o. female  patient of Dr. Lianne Bushy presenting on 03/11/2023 for Cough (X months) and Sinus Pressure  11/2022 allergist last visit reviewed.Marland Kitchen aspiration pneumonitis, moderate persistent asthma.   Past  pulmonary note from 09/2022 stated cough multifactorial from asthma with bronchiolitis and post nasal drip. CT chest in May 2024 showed diffuse bronchial thickening with mucus plugging, new patchy tree-in-bud consolidation R>L lower lobe segment concerning for aspiration pneumonitis.  Pulmonary function testing today consistent with moderate obstructive airway disease without significant bronchodilator response.   Reviewed recent OV with ENT 12/2022 for chronic sinus disease, chronic cough reflux.. recommended zyrtec and mucinex.  On protonix.  Date of onset:  months  She  always has the cough.. but now  she feels that mucus is moving down into chest... has noted this in last month... she just wants to make sure it does not " go into pneumonia"  She feels cough is more moist  in last month  She is more winded than usual.. using albuterol somewhat more... does not really feel like she is having an asthma flare.  Occ color to mucus... pale green  No fever   Has some face pressure in last day.  No ear pain, no ST.  Sick contacts:  none, goes to church COVID testing:   none     She has tried to treat with  stiolto, zyrtec  Albuterol prn.  Atrovent nasal spray  Using mucinex and coricidin for mucus.  Nasal rrise.     Non-smoker.       Relevant past medical, surgical, family and social history reviewed and updated as indicated. Interim medical history since our last visit reviewed. Allergies and medications reviewed and updated. Outpatient Medications Prior to Visit  Medication Sig Dispense Refill   acetaminophen (TYLENOL) 650 MG CR tablet Take 650 mg by mouth every 8 (eight) hours as needed for pain.     albuterol (VENTOLIN HFA) 108 (90 Base) MCG/ACT inhaler Inhale 2 puffs into the lungs every 6 (six) hours as needed for wheezing or shortness of breath. 8 g 11   alendronate (FOSAMAX) 70 MG tablet Take 1 tablet (70 mg total) by mouth once a week. Take with a full glass of water on an empty stomach. 13 tablet 3   amLODipine (NORVASC) 10 MG tablet Take 1 tablet (10 mg total) by mouth daily. 90 tablet 3   atorvastatin (LIPITOR) 20 MG tablet TAKE 1 TABLET BY MOUTH EVERY DAY 90 tablet 1   Azelastine HCl 137 MCG/SPRAY SOLN USE 2 SPRAYS NASALLY TWICE A DAY 30 mL 5   cetirizine (ZYRTEC) 10 MG tablet Take 10 mg by mouth daily as needed for allergies.     Chlorphen-Pseudoephed-APAP (CORICIDIN D PO) Take by mouth.     clotrimazole (MYCELEX) 10 MG troche Take 1 tablet (10 mg total) by mouth 5 (five) times daily. Repeat  course if needed 70 Troche 0   desonide (DESOWEN) 0.05 % ointment Apply 1 Application topically 2 (two) times daily.     dicyclomine (BENTYL) 20 MG tablet TAKE 1 TABLET BY MOUTH THREE TIMES A DAY AS NEEDED 270 tablet 1   dorzolamide (TRUSOPT) 2 % ophthalmic solution SMARTSIG:In Eye(s)     FLUoxetine (PROZAC) 10 MG tablet Take 1 tablet (10 mg total) by mouth daily. 90 tablet 3   GARLIC PO Take 1 tablet by mouth every other day.     guaiFENesin (MUCINEX) 600 MG 12 hr tablet Take 1 tablet (600 mg total) by mouth 2 (two) times daily as needed for to loosen phlegm or cough. 60 tablet 1   hydrocortisone 2.5 % cream Apply 1 Application topically 2 (two) times daily as needed (itching).     ipratropium  (ATROVENT) 0.06 % nasal spray Place 2 sprays into both nostrils 4 (four) times daily. 15 mL 12   latanoprost (XALATAN) 0.005 % ophthalmic solution Place 1 drop into both eyes at bedtime.     montelukast (SINGULAIR) 10 MG tablet TAKE 1 TABLET BY MOUTH DAILY AS DIRECTED 90 tablet 1   Multiple Vitamin (MULTIVITAMIN) tablet Take 1 tablet by mouth daily.     pantoprazole (PROTONIX) 40 MG tablet TAKE 1 TABLET BY MOUTH EVERY DAY 90 tablet 1   Tiotropium Bromide-Olodaterol (STIOLTO RESPIMAT) 2.5-2.5 MCG/ACT AERS Inhale 2 puffs into the lungs daily. 4 g 11   valsartan (DIOVAN) 160 MG tablet Take 1 tablet (160 mg total) by mouth 2 (two) times daily. 180 tablet 2   vitamin B-12 (CYANOCOBALAMIN) 1000 MCG tablet Take 1,000 mcg by mouth daily.     vitamin C (ASCORBIC ACID) 500 MG tablet Take 500 mg by mouth daily.     Zinc 50 MG CAPS Take 1 capsule by mouth daily.     cetirizine (ZYRTEC ALLERGY) 10 MG tablet Take 1 tablet (10 mg total) by mouth daily. (Patient taking differently: Take 10 mg by mouth as needed.) 30 tablet 5   fluticasone (FLONASE) 50 MCG/ACT nasal spray Place 2 sprays into both nostrils daily. 16 g 11   ketoconazole (NIZORAL) 2 % shampoo Apply topically.     UNABLE TO FIND Med Name: omega XL     No facility-administered medications prior to visit.     Per HPI unless specifically indicated in ROS section below Review of Systems  Constitutional:  Negative for chills, fatigue and fever.  HENT:  Positive for congestion and sinus pressure. Negative for ear discharge, ear pain and sore throat.   Eyes:  Negative for pain.  Respiratory:  Positive for cough. Negative for shortness of breath and wheezing.   Cardiovascular:  Negative for chest pain, palpitations and leg swelling.  Gastrointestinal:  Negative for abdominal pain.  Genitourinary:  Negative for dysuria and vaginal bleeding.  Musculoskeletal:  Negative for back pain.  Neurological:  Negative for syncope, light-headedness and headaches.   Psychiatric/Behavioral:  Negative for dysphoric mood.    Objective:  BP 130/80 (BP Location: Left Arm, Patient Position: Sitting, Cuff Size: Normal)   Pulse 68   Temp 98 F (36.7 C) (Temporal)   Ht 5' 4.5" (1.638 m)   Wt 143 lb (64.9 kg)   SpO2 96%   BMI 24.17 kg/m   Wt Readings from Last 3 Encounters:  03/11/23 143 lb (64.9 kg)  12/07/22 140 lb 8 oz (63.7 kg)  11/10/22 138 lb 12.8 oz (63 kg)      Physical Exam Constitutional:  General: She is not in acute distress.    Appearance: She is well-developed. She is not ill-appearing or toxic-appearing.  HENT:     Head: Normocephalic.     Right Ear: Hearing, ear canal and external ear normal. A middle ear effusion is present. Tympanic membrane is not erythematous, retracted or bulging.     Left Ear: Hearing, ear canal and external ear normal. A middle ear effusion is present. Tympanic membrane is not erythematous, retracted or bulging.     Nose: Mucosal edema and rhinorrhea present.     Right Sinus: No maxillary sinus tenderness or frontal sinus tenderness.     Left Sinus: No maxillary sinus tenderness or frontal sinus tenderness.     Mouth/Throat:     Pharynx: Uvula midline. Postnasal drip present. No pharyngeal swelling, oropharyngeal exudate or posterior oropharyngeal erythema.  Eyes:     General: Lids are normal. Lids are everted, no foreign bodies appreciated.     Conjunctiva/sclera: Conjunctivae normal.     Pupils: Pupils are equal, round, and reactive to light.  Neck:     Thyroid: No thyroid mass or thyromegaly.     Vascular: No carotid bruit.     Trachea: Trachea normal.  Cardiovascular:     Rate and Rhythm: Normal rate and regular rhythm.     Pulses: Normal pulses.     Heart sounds: Normal heart sounds, S1 normal and S2 normal. No murmur heard.    No friction rub. No gallop.  Pulmonary:     Effort: Pulmonary effort is normal. No tachypnea or respiratory distress.     Breath sounds: Normal breath sounds. No  decreased breath sounds, wheezing, rhonchi or rales.  Musculoskeletal:     Cervical back: Normal range of motion and neck supple.  Skin:    General: Skin is warm and dry.     Findings: No rash.  Neurological:     Mental Status: She is alert.  Psychiatric:        Mood and Affect: Mood is not anxious or depressed.        Speech: Speech normal.        Behavior: Behavior normal. Behavior is cooperative.        Judgment: Judgment normal.       Results for orders placed or performed in visit on 03/11/23  POC Influenza A&B (Binax test)   Collection Time: 03/11/23 10:27 AM  Result Value Ref Range   Influenza A, POC Negative Negative   Influenza B, POC Negative Negative  POC COVID-19   Collection Time: 03/11/23 10:27 AM  Result Value Ref Range   SARS Coronavirus 2 Ag Negative Negative    Assessment and Plan  Moderate persistent asthma with acute exacerbation Assessment & Plan: Chronic, no clear acute exacerbation.   Chronic cough Assessment & Plan: Chronic, minimal change from baseline.  Lung exam stable, no clear sign of pneumonia or bacterial sinus infection.  COVID and flu testing in office today negative.  Discussed in detail with the patient how she feels that she has had more of these symptoms occur since she has had to stop the Mucinex and decongestant.  We discussed that she is okay to use Mucinex DM as there is no decongestant included.  She feels that this will likely help control her symptoms better. We did discuss possibility of mild asthma exacerbation but she states she does not feel like this is going on.  She is not interested in a prednisone course.  Recommended patient to  call and set up her 38-month pulmonary follow-up as planned.  Orders: -     POC Influenza A&B(BINAX/QUICKVUE) -     POC COVID-19 BinaxNow  Return and ER precautions provided.  No follow-ups on file.   Kerby Nora, MD

## 2023-03-11 NOTE — Patient Instructions (Signed)
Call to make pulmonary 6 month follow up.

## 2023-03-11 NOTE — Assessment & Plan Note (Signed)
Chronic, minimal change from baseline.  Lung exam stable, no clear sign of pneumonia or bacterial sinus infection.  COVID and flu testing in office today negative.  Discussed in detail with the patient how she feels that she has had more of these symptoms occur since she has had to stop the Mucinex and decongestant.  We discussed that she is okay to use Mucinex DM as there is no decongestant included.  She feels that this will likely help control her symptoms better. We did discuss possibility of mild asthma exacerbation but she states she does not feel like this is going on.  She is not interested in a prednisone course.  Recommended patient to call and set up her 46-month pulmonary follow-up as planned.

## 2023-03-13 ENCOUNTER — Other Ambulatory Visit: Payer: Self-pay | Admitting: Family Medicine

## 2023-03-15 NOTE — Telephone Encounter (Signed)
Sent but please get update on patient.  Thanks. 

## 2023-03-15 NOTE — Telephone Encounter (Signed)
Last office visit: 08/18/22 Next office visit: NOTHING SCHEDULED Last refill: clotrimazole (MYCELEX) 10 MG troche 70 TROCHE 0 REFILLS

## 2023-03-17 NOTE — Telephone Encounter (Signed)
 Reached out to patient to advise that medication has been sent. Patient states that she did have an issue with IBS but got over that and developed a UTI. Since Tuesday she has been eating applesauce, bread, bananas and rice and has been drinking water. In addition she drank 1/2 gallon of cranberry juice yesterday and is finishing up a 1/2 a gallon today. She did take 1/2 of imodium  as well.   Overall she just feels weak with everything going on between the IBS and the UTI. She is wondering is there anything else she can for UTI and IBS.   She really appreciated the call.

## 2023-03-17 NOTE — Telephone Encounter (Signed)
 I think if she isn't getting better then we need to recheck her (or get seen at [facility]) to recheck her urine/etc.  I hope she feels better soon.  Thanks.

## 2023-03-18 NOTE — Telephone Encounter (Signed)
 Spoke with patient and she says that she is feeling better today and that she will contact us  to see about setting up an appointment if she is not feeling better in about a weeks time

## 2023-03-22 ENCOUNTER — Ambulatory Visit: Payer: Self-pay | Admitting: Family Medicine

## 2023-03-22 NOTE — Telephone Encounter (Signed)
Chief Complaint: IBS Symptoms: diarrhea Frequency: about a week worse since yesterday Pertinent Negatives: Patient denies fever, N/V, Disposition: [] ED /[] Urgent Care (no appt availability in office) / [x] Appointment(In office/virtual)/ []  [facility]/ [] Home Care/ [] Refused Recommended Disposition /[] [facility]/ []  Follow-up with PCP  Additional Notes: Pt states that she has done diet modifications for the IBS that has flared up over the last week.  Pt attempted to progress her diet yesterday and increased the s/s. HH nurse came out yesterday and was told to f/u with doctors office d/t her swollen knee. Pt informed of care instructions per Epic. Pt was already sched for tomorrow.   Copied from CRM [id number]. Topic: Clinical - Red Word Triage >> Mar 22, 2023  1:13 PM Pascal Lux wrote: Red Word that prompted transfer to Nurse Triage: Irritable bowel syndrome kicked in last week and only ate, banana, apple sauce and rice. Yesterday had scrambled eggs and cheese with a butterball sandwich and felt good. Last night she experienced diarrhea. 4am this morning could not make it to the bathroom. Right knee swollen/fatter than left knee. Reason for Disposition  [1] MODERATE diarrhea (e.g., 4-6 times / day more than normal) AND [2] present > 48 hours (2 days)  Answer Assessment - Initial Assessment Questions 1. DIARRHEA SEVERITY: "How bad is the diarrhea?" "How many more stools have you had in the past 24 hours than normal?"    - NO DIARRHEA (SCALE 0)   - MILD (SCALE 1-3): Few loose or mushy BMs; increase of 1-3 stools over normal daily number of stools; mild increase in ostomy output.   -  MODERATE (SCALE 4-7): Increase of 4-6 stools daily over normal; moderate increase in ostomy output.   -  SEVERE (SCALE 8-10; OR "WORST POSSIBLE"): Increase of 7 or more stools daily over normal; moderate increase in ostomy output; incontinence.    Moderate to severe 2. ONSET: "When did the diarrhea  begin?"      Over the course of the week 3. BM CONSISTENCY: "How loose or watery is the diarrhea?"      loose 4. VOMITING: "Are you also vomiting?" If Yes, ask: "How many times in the past 24 hours?"      denies 5. ABDOMEN PAIN: "Are you having any abdomen pain?" If Yes, ask: "What does it feel like?" (e.g., crampy, dull, intermittent, constant)      denies 6. ABDOMEN PAIN SEVERITY: If present, ask: "How bad is the pain?"  (e.g., Scale 1-10; mild, moderate, or severe)   - MILD (1-3): doesn't interfere with normal activities, abdomen soft and not tender to touch    - MODERATE (4-7): interferes with normal activities or awakens from sleep, abdomen tender to touch    - SEVERE (8-10): excruciating pain, doubled over, unable to do any normal activities       denies 7. ORAL INTAKE: If vomiting, "Have you been able to drink liquids?" "How much liquids have you had in the past 24 hours?"     Pt states that she drinks electrolyte water, states she doesn't think she is dehydrated 8. HYDRATION: "Any signs of dehydration?" (e.g., dry mouth [not just dry lips], too weak to stand, dizziness, new weight loss) "When did you last urinate?"     denies 9. EXPOSURE: "Have you traveled to a foreign country recently?" "Have you been exposed to anyone with diarrhea?" "Could you have eaten any food that was spoiled?"     denies 10. ANTIBIOTIC USE: "Are you  taking antibiotics now or have you taken antibiotics in the past 2 months?"       denies 11. OTHER SYMPTOMS: "Do you have any other symptoms?" (e.g., fever, blood in stool)       denies  Protocols used: Diarrhea-A-AH

## 2023-03-23 ENCOUNTER — Ambulatory Visit: Payer: Medicare Other | Admitting: Family Medicine

## 2023-03-23 ENCOUNTER — Encounter: Payer: Self-pay | Admitting: Family Medicine

## 2023-03-23 ENCOUNTER — Ambulatory Visit (INDEPENDENT_AMBULATORY_CARE_PROVIDER_SITE_OTHER)
Admission: RE | Admit: 2023-03-23 | Discharge: 2023-03-23 | Disposition: A | Discharge status: Home / Self Care | Payer: Medicare Other | Source: Ambulatory Visit | Attending: Family Medicine | Admitting: Family Medicine

## 2023-03-23 VITALS — BP 110/56 | HR 73 | Temp 98.0°F | Ht 64.5 in | Wt 140.5 lb

## 2023-03-23 DIAGNOSIS — K589 Irritable bowel syndrome without diarrhea: Secondary | ICD-10-CM

## 2023-03-23 DIAGNOSIS — M1711 Unilateral primary osteoarthritis, right knee: Secondary | ICD-10-CM | POA: Diagnosis not present

## 2023-03-23 DIAGNOSIS — Z1231 Encounter for screening mammogram for malignant neoplasm of breast: Secondary | ICD-10-CM | POA: Diagnosis not present

## 2023-03-23 DIAGNOSIS — M25461 Effusion, right knee: Secondary | ICD-10-CM | POA: Diagnosis not present

## 2023-03-23 NOTE — Telephone Encounter (Signed)
FYI::  Patient is seeing Dr. Milinda Antis today

## 2023-03-23 NOTE — Progress Notes (Signed)
Subjective:    Patient ID: [name], female    DOB: 01/13/1946, 78 y.o.   MRN: [id number]  HPI  Wt Readings from Last 3 Encounters:  03/23/23 140 lb 8 oz (63.7 kg)  03/11/23 143 lb (64.9 kg)  12/07/22 140 lb 8 oz (63.7 kg)   23.74 kg/m  Vitals:   03/23/23 1009  BP: (!) 110/56  Pulse: 73  Temp: 98 F (36.7 C)  SpO2: 99%     78 yo pt of Dr Para March presents with  Right knee swelling  IBS   Right knee swelling-after being on feet more (was doing silver sneakers 3 times per week) -has not started back  Does have a personal trainer once a week -using weights  Worse for a couple of months  Was wearing a brace on her leg from last ortho in [place] Per history- has knee arthritis in past  Did have some PT  Also history of subluxation of patellofemoral joint on the right   Today is more swelling than pain  When painful it is the medial knee     Needs mammogram order  It looks like there is an active order from dr Para March for the [facility]    In chart noted IBS  Last a/p from PCP 04/2022 Likely exacerbated by multiple stressors and discussed options. Reasonable to try FODMAP diet in the meantime and handout given to patient. She can start with that and then let me know how she is doing. She can update me as needed. Okay for outpatient follow-up.  ? Colonoscopy in 2006  Dicyclomine on med list from past   Diarrhea last week  Took immoduim 1/2 pill once   Saw GI in [place] in 2022 Needs to est with GI here   Very stressed since losing hurband 3 y ago  Has to do all household and financial things- new to her Sitting at computer longer - that hurts her back and limbs sometime  Trying to change her ergonomics     HTN bp is stable today  No cp or palpitations or headaches or edema  No side effects to medicines  BP Readings from Last 3 Encounters:  03/23/23 (!) 110/56  03/11/23 130/80  12/07/22 102/76    Takes valsartan and amlodipine   Takes protonix   Takes alendronate   Lab Results  Component Value Date   NA 138 09/01/2022   K 4.7 09/01/2022   CO2 30 09/01/2022   GLUCOSE 89 09/01/2022   BUN 13 09/01/2022   CREATININE 0.76 09/01/2022   CALCIUM 9.7 09/01/2022   GFR 85.37 12/28/2021   EGFR 80 09/01/2022   GFRNONAA >60 08/01/2021   Lab Results  Component Value Date   ALT 18 09/01/2022   AST 23 09/01/2022   ALKPHOS 38 04/03/2020   BILITOT 0.3 09/01/2022   Lab Results  Component Value Date   WBC 9.0 09/01/2022   HGB 12.2 09/01/2022   HCT 36.9 09/01/2022   MCV 93.9 09/01/2022   PLT 258 09/01/2022    Patient Active Problem List   Diagnosis Date Noted   Screening mammogram for breast cancer 03/23/2023   Chronic cough 09/15/2022   Post-nasal drip 09/15/2022   Pulmonary nodule 09/15/2022   Advance care planning 12/30/2021   HLD (hyperlipidemia) 12/30/2021   Osteopenia 12/30/2021   Uveitis 12/28/2021   PVD (posterior vitreous detachment) 12/28/2021   Hypertensive retinopathy 12/28/2021   Glaucoma 12/28/2021   Acid reflux 12/28/2021   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 St Jude Medical Assurity MRI dual-chamber pacemaker  08/01/2021   Symptomatic bradycardia 08/01/2021   HTN (hypertension) 08/01/2021   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   Anxiety 04/22/2020   Lumbar sprain 04/11/2017   Subluxation of patellofemoral joint 08/26/2016   Osteoarthritis of knee 08/26/2016   Migraine without status migrainosus, not intractable 10/30/2013   Asthma 01/01/2008   Depression 01/08/2003   Raynaud's syndrome without gangrene 01/26/2002   Irritable bowel syndrome 03/20/1999   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 St Jude Medical Assurity MRI dual-chamber pacemaker  08/01/2021   Symptomatic bradycardia 08/01/2021   Past Surgical History:  Procedure  Laterality Date   CATARACT EXTRACTION     NASAL SINUS SURGERY     several   PACEMAKER IMPLANT N/A 07/31/2021   Procedure: PACEMAKER IMPLANT;  Surgeon: Regan Lemming, MD;  [facility];  Service: Cardiovascular;  Laterality: N/A;   toenail removal      left great toenail   TUBAL LIGATION     Social History   Tobacco Use   Smoking status: Never    Passive exposure: Past   Smokeless tobacco: Never  Vaping Use   Vaping status: Never Used  Substance Use Topics   Alcohol use: No   Drug use: No   Family History  Problem Relation Age of Onset   Stroke Mother    Hypertension Mother    Breast cancer Sister    Kidney failure Son    Heart disease Son        heart transplant   Other Son        open heart surgery at 54 months old   Colon cancer Neg Hx    Allergies  Allergen Reactions   Morphine Other (See Comments)   Azithromycin     Other reaction(s): gi distress, GI intolerance, Other   Hydrochlorothiazide Other (See Comments)    Not a allergy, but hyponatremia induced seizure with HCTZ 2022   Levofloxacin Hives and Rash   Sulfa Antibiotics Hives    Red patches on legs years ago    Ciprofloxacin     rash   Current Outpatient Medications on File Prior to Visit  Medication Sig Dispense Refill   acetaminophen (TYLENOL) 650 MG CR tablet Take 650 mg by mouth every 8 (eight) hours as needed for pain.     albuterol (VENTOLIN HFA) 108 (90 Base) MCG/ACT inhaler Inhale 2 puffs into the lungs every 6 (six) hours as needed for wheezing or shortness of breath. 8 g 11   alendronate (FOSAMAX) 70 MG tablet Take 1 tablet (70 mg total) by mouth once a week. Take with a full glass of water on an empty stomach. 13 tablet 3   amLODipine (NORVASC) 10 MG tablet Take 1 tablet (10 mg total) by mouth daily. 90 tablet 3   atorvastatin (LIPITOR) 20 MG tablet TAKE 1 TABLET BY MOUTH EVERY DAY 90 tablet 1   Azelastine HCl 137 MCG/SPRAY SOLN USE 2 SPRAYS NASALLY TWICE A DAY 30 mL 5    cetirizine (ZYRTEC) 10 MG tablet Take 10 mg by mouth daily as needed for allergies.     Chlorphen-Pseudoephed-APAP (CORICIDIN D PO) Take by mouth.     clotrimazole (MYCELEX) 10 MG troche TAKE 1 TABLET (10 MG  TOTAL) BY MOUTH 5 (FIVE) TIMES DAILY. REPEAT COURSE IF NEEDED 70 Troche 0   dicyclomine (BENTYL) 20 MG tablet TAKE 1 TABLET BY MOUTH THREE TIMES A DAY AS NEEDED 270 tablet 1   dorzolamide (TRUSOPT) 2 % ophthalmic solution SMARTSIG:In Eye(s)     FLUoxetine (PROZAC) 10 MG tablet Take 1 tablet (10 mg total) by mouth daily. 90 tablet 3   GARLIC PO Take 1 tablet by mouth every other day.     guaiFENesin (MUCINEX) 600 MG 12 hr tablet Take 1 tablet (600 mg total) by mouth 2 (two) times daily as needed for to loosen phlegm or cough. 60 tablet 1   hydrocortisone 2.5 % cream Apply 1 Application topically 2 (two) times daily as needed (itching).     ipratropium (ATROVENT) 0.06 % nasal spray Place 2 sprays into both nostrils 4 (four) times daily. 15 mL 12   latanoprost (XALATAN) 0.005 % ophthalmic solution Place 1 drop into both eyes at bedtime.     montelukast (SINGULAIR) 10 MG tablet TAKE 1 TABLET BY MOUTH DAILY AS DIRECTED 90 tablet 1   Multiple Vitamin (MULTIVITAMIN) tablet Take 1 tablet by mouth daily.     pantoprazole (PROTONIX) 40 MG tablet TAKE 1 TABLET BY MOUTH EVERY DAY 90 tablet 1   Tiotropium Bromide-Olodaterol (STIOLTO RESPIMAT) 2.5-2.5 MCG/ACT AERS Inhale 2 puffs into the lungs daily. 4 g 11   valsartan (DIOVAN) 160 MG tablet Take 1 tablet (160 mg total) by mouth 2 (two) times daily. 180 tablet 2   vitamin B-12 (CYANOCOBALAMIN) 1000 MCG tablet Take 1,000 mcg by mouth daily.     vitamin C (ASCORBIC ACID) 500 MG tablet Take 500 mg by mouth daily.     Zinc 50 MG CAPS Take 1 capsule by mouth daily.     No current facility-administered medications on file prior to visit.    Review of Systems     Objective:   Physical Exam Constitutional:      General: She is not in acute distress.     Appearance: Normal appearance. She is well-developed and normal weight. She is not ill-appearing or diaphoretic.  HENT:     Head: Normocephalic and atraumatic.  Eyes:     Conjunctiva/sclera: Conjunctivae normal.     Pupils: Pupils are equal, round, and reactive to light.  Neck:     Thyroid: No thyromegaly.     Vascular: No carotid bruit or JVD.  Cardiovascular:     Rate and Rhythm: Normal rate and regular rhythm.     Heart sounds: Normal heart sounds.     No gallop.  Pulmonary:     Effort: Pulmonary effort is normal. No respiratory distress.     Breath sounds: Normal breath sounds. No wheezing or rales.  Abdominal:     General: Abdomen is flat. Bowel sounds are normal. There is no distension or abdominal bruit.     Palpations: Abdomen is rigid. There is no hepatomegaly, splenomegaly or pulsatile mass.     Tenderness: There is no abdominal tenderness.  Musculoskeletal:     Cervical back: Normal range of motion and neck supple.     Right lower leg: No edema.     Left lower leg: No edema.     Comments: Knee right  Mild puffiness/superiorly      no obv effusion on exam No warmth to the touch  No crepitus  ROM: full/ some discomfort with full flex Flex-nl Ext -nl Mcmurray-nl Bounce test -no   Stability: Anterior drawer-normal  Lachman exam -nl  Tenderness mild in medial joint line  Gait -nl     Lymphadenopathy:     Cervical: No cervical adenopathy.  Skin:    General: Skin is warm and dry.     Coloration: Skin is not pale.     Findings: No rash.  Neurological:     Mental Status: She is alert.     Coordination: Coordination normal.     Deep Tendon Reflexes: Reflexes are normal and symmetric. Reflexes normal.  Psychiatric:        Mood and Affect: Mood normal.           Assessment & Plan:   Problem List Items Addressed This Visit       Digestive   Irritable bowel syndrome   Ongoing - was able to review pcp notes and noted her work up began in [place] where she  saw her last GI specialist  ? Last colonoscopy 2006  Will need to get est with GI here-will message pcp to see if he wants to do this or have me do it  Takes dicyclomine 20 mg tid prn (pretty much atc these days) 2 recent flares with diarrhea Thinks these were caused by going off FODMAP diet-is back on track with eating now  May have to avoid some raw veggies and chocolate  Stress is also a trigger Pt denied any n/v or fever to indicate infection  Reassuring exam today   Will reach out to pcp re: GI referral         Musculoskeletal and Integument   Osteoarthritis of knee - Primary   Suspect this is cause of upper knee swelling and intermittent medial pain  Reviewed past medical chart noting history of oa and also patlofemoral subluxation  Per pt no injections in past  She uses a product on it (? Muscle rub) but has not tried topical nsaid  Does wear a brace for exercise   Reassuring exam  Some medial joint line tenderness Encouraged use of cold compress when able  Elevation  Compression if helpful  Xray ordered         Relevant Orders   DG Knee 4 Views W/Patella Right     Other   Screening mammogram for breast cancer   Pt mentioned needing order for mammogram  I saw there was already active order in chart from pcp  Given # for [facility] img breast center to call and schedule

## 2023-03-23 NOTE — Patient Instructions (Addendum)
Use a cold compress on knee when you can for swelling  Elevate leg when you sit  Voltaren gel 1% for pain over the counter is helpful also   Xray today    I will reach out to Dr Para March to refer you to GI in [place] for the IBS Continue the FODMAP diet if helpful  If symptoms worsen in the meantime let us know       There is an active order for mammogram from dr Olivia Mackie have an order for:  []   2D Mammogram  [x]   3D Mammogram  []   Bone Density     Please call for appointment:   []   [facility]  [address] [place] [address]  [phone]  []   [facility])   [address]  [place], [place] [address]  [phone]  [x]   [facility]      [address] [place], [place]        [phone]         []   [facility]  [address] [place], [place]  [phone]  []  [facility]   [address]   [place], [place] [address]  [phone]  []  [facility]  [address] [place], [place] [address] [phone]    Make sure to wear two piece clothing  No lotions powders or deodorants the day of the appointment Make sure to bring picture ID and insurance card.  Bring list of medications you are currently taking including any supplements.   Schedule your screening mammogram through MyChart!   [facility] [place] imaging sites can now be scheduled through MyChart.  Log into your MyChart account.  Go to 'Visit' (or 'Appointments' if  on mobile App) --> Schedule an  Appointment  Under 'Select a Reason for Visit' choose the Mammogram  Screening option.  Complete the pre-visit questions  and select the time and place that  best fits your schedule

## 2023-03-23 NOTE — Telephone Encounter (Signed)
Noted. Thanks.

## 2023-03-23 NOTE — Assessment & Plan Note (Signed)
Pt mentioned needing order for mammogram  I saw there was already active order in chart from pcp  Given # for [facility] img breast center to call and schedule

## 2023-03-23 NOTE — Assessment & Plan Note (Addendum)
Ongoing - was able to review pcp notes and noted her work up began in [place] where she saw her last GI specialist  ? Last colonoscopy 2006  Will need to get est with GI here-will message pcp to see if he wants to do this or have me do it  Takes dicyclomine 20 mg tid prn (pretty much atc these days) 2 recent flares with diarrhea Thinks these were caused by going off FODMAP diet-is back on track with eating now  May have to avoid some raw veggies and chocolate  Stress is also a trigger Pt denied any n/v or fever to indicate infection  Reassuring exam today   Will reach out to pcp re: GI referral

## 2023-03-23 NOTE — Assessment & Plan Note (Addendum)
Suspect this is cause of upper knee swelling and intermittent medial pain  Reviewed past medical chart noting history of oa and also patlofemoral subluxation  Per pt no injections in past  She uses a product on it (? Muscle rub) but has not tried topical nsaid  Does wear a brace for exercise   Reassuring exam  Some medial joint line tenderness Encouraged use of cold compress when able  Elevation  Compression if helpful  Xray ordered

## 2023-03-24 ENCOUNTER — Encounter: Payer: Self-pay | Admitting: Family Medicine

## 2023-03-28 ENCOUNTER — Other Ambulatory Visit: Payer: Self-pay | Admitting: Family Medicine

## 2023-03-28 DIAGNOSIS — K589 Irritable bowel syndrome without diarrhea: Secondary | ICD-10-CM

## 2023-03-28 NOTE — Progress Notes (Signed)
Please let patient know I put in the GI referral and she should get a call about that.  Thanks.

## 2023-03-29 NOTE — Progress Notes (Signed)
 Patient notified

## 2023-04-05 ENCOUNTER — Encounter: Payer: Self-pay | Admitting: Nurse Practitioner

## 2023-04-06 ENCOUNTER — Ambulatory Visit (HOSPITAL_BASED_OUTPATIENT_CLINIC_OR_DEPARTMENT_OTHER): Payer: Medicare Other

## 2023-04-06 ENCOUNTER — Ambulatory Visit (HOSPITAL_BASED_OUTPATIENT_CLINIC_OR_DEPARTMENT_OTHER): Payer: Medicare Other | Admitting: Orthopaedic Surgery

## 2023-04-06 DIAGNOSIS — G8929 Other chronic pain: Secondary | ICD-10-CM

## 2023-04-06 DIAGNOSIS — M25511 Pain in right shoulder: Secondary | ICD-10-CM | POA: Diagnosis not present

## 2023-04-06 DIAGNOSIS — M19011 Primary osteoarthritis, right shoulder: Secondary | ICD-10-CM | POA: Diagnosis not present

## 2023-04-06 MED ORDER — TRIAMCINOLONE ACETONIDE 40 MG/ML IJ SUSP
80.0000 mg | INTRAMUSCULAR | Status: AC | PRN
  Administered 2023-04-06: 80 mg via INTRA_ARTICULAR

## 2023-04-06 MED ORDER — LIDOCAINE HCL 1 % IJ SOLN
4.0000 mL | INTRAMUSCULAR | Status: AC | PRN
  Administered 2023-04-06: 4 mL

## 2023-04-06 NOTE — Progress Notes (Signed)
 Chief Complaint: Right shoulder, right knee pain     History of Present Illness:    [name] is a 78 y.o. female presents today with right shoulder as well as right knee pain.  He has been both hurting over the last several years.  She has been seeing an orthopedist in [place].  She has previously had injections in the knee which did give her some temporary relief.  She is here today for further discussion.  She has recently moved to [place] as her husband has unfortunately passed.  She has had anti-inflammatories in the past.  She states that the right shoulder has been weekly painful for the last several months after she attempted to lift a weight of this unexpectedly heavier while at church.  Since that time she has had pain and limited overhead motion    PMH/PSH/Family History/Social History/Meds/Allergies:    Past Medical History:  Diagnosis Date  . HLD (hyperlipidemia)   . HTN (hypertension) 08/01/2021  . IBS (irritable bowel syndrome)   . Osteoporosis   . Sinusitis   . 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 Symptomatic bradycardia 08/01/2021   Past Surgical History:  Procedure Laterality Date  . CATARACT EXTRACTION    . NASAL SINUS SURGERY     several  . PACEMAKER IMPLANT N/A 07/31/2021   Procedure: PACEMAKER IMPLANT;  Surgeon: Regan Lemming, MD;  Location: [facility];  Service: Cardiovascular;  Laterality: N/A;  . toenail removal      left great toenail  . TUBAL LIGATION     Social History   Socioeconomic History  . Marital status: Widowed    Spouse name: Not on file  . Number of children: Not on file  . Years of education: Not on file  . Highest education level: Some college, no degree  Occupational History  . Not on file  Tobacco Use  . Smoking status: Never    Passive exposure: Past  . Smokeless tobacco: Never  Vaping Use  . Vaping status: Never  Used  Substance and Sexual Activity  . Alcohol use: No  . Drug use: No  . Sexual activity: Not Currently  Other Topics Concern  . Not on file  Social History Narrative   Widowed 2022.     Lived in [place] for 50 years.     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   Social Drivers of Health   Financial Resource Strain: Low Risk  (10/21/2022)   Overall Financial Resource Strain (CARDIA)   . Difficulty of Paying Living Expenses: Not hard at all  Food Insecurity: No Food Insecurity (10/21/2022)   Hunger Vital Sign   . Worried About Programme researcher, broadcasting/film/video in the Last Year: Never true   . Ran Out of Food in the Last Year: Never true  Transportation Needs: No Transportation Needs (10/21/2022)   PRAPARE - Transportation   . Lack of Transportation (Medical): No   . Lack of Transportation (Non-Medical): No  Physical Activity: Sufficiently Active (10/21/2022)   Exercise Vital Sign   . Days of Exercise per Week: 3 days   . Minutes of Exercise per Session: 60 min  Stress: No Stress Concern Present (10/21/2022)   [employer] of Occupational Health - Occupational Stress Questionnaire   . Feeling of Stress : Not at all  Social Connections: Moderately Integrated (10/21/2022)   Social Connection and Isolation Panel [NHANES]   .  Frequency of Communication with Friends and Family: More than three times a week   . Frequency of Social Gatherings with Friends and Family: More than three times a week   . Attends Religious Services: More than 4 times per year   . Active Member of Clubs or Organizations: Yes   . Attends Banker Meetings: More than 4 times per year   . Marital Status: Widowed   Family History  Problem Relation Age of Onset  . Stroke Mother   . Hypertension Mother   . Breast cancer Sister   . Kidney failure Son   . Heart disease Son        heart transplant  . Other Son        open heart surgery at 63 months old  . Colon cancer Neg Hx     Allergies  Allergen Reactions  . Morphine Other (See Comments)  . Azithromycin     Other reaction(s): gi distress, GI intolerance, Other  . Hydrochlorothiazide Other (See Comments)    Not a allergy, but hyponatremia induced seizure with HCTZ 2022  . Levofloxacin Hives and Rash  . Sulfa Antibiotics Hives    Red patches on legs years ago   . Ciprofloxacin     rash   Current Outpatient Medications  Medication Sig Dispense Refill  . acetaminophen (TYLENOL) 650 MG CR tablet Take 650 mg by mouth every 8 (eight) hours as needed for pain.    Marland Kitchen albuterol (VENTOLIN HFA) 108 (90 Base) MCG/ACT inhaler Inhale 2 puffs into the lungs every 6 (six) hours as needed for wheezing or shortness of breath. 8 g 11  . alendronate (FOSAMAX) 70 MG tablet Take 1 tablet (70 mg total) by mouth once a week. Take with a full glass of water on an empty stomach. 13 tablet 3  . amLODipine (NORVASC) 10 MG tablet Take 1 tablet (10 mg total) by mouth daily. 90 tablet 3  . atorvastatin (LIPITOR) 20 MG tablet TAKE 1 TABLET BY MOUTH EVERY DAY 90 tablet 1  . Azelastine HCl 137 MCG/SPRAY SOLN USE 2 SPRAYS NASALLY TWICE A DAY 30 mL 5  . cetirizine (ZYRTEC) 10 MG tablet Take 10 mg by mouth daily as needed for allergies.    . Chlorphen-Pseudoephed-APAP (CORICIDIN D PO) Take by mouth.    . clotrimazole (MYCELEX) 10 MG troche TAKE 1 TABLET (10 MG TOTAL) BY MOUTH 5 (FIVE) TIMES DAILY. REPEAT COURSE IF NEEDED 70 Troche 0  . dicyclomine (BENTYL) 20 MG tablet TAKE 1 TABLET BY MOUTH THREE TIMES A DAY AS NEEDED 270 tablet 1  . dorzolamide (TRUSOPT) 2 % ophthalmic solution SMARTSIG:In Eye(s)    . FLUoxetine (PROZAC) 10 MG tablet Take 1 tablet (10 mg total) by mouth daily. 90 tablet 3  . GARLIC PO Take 1 tablet by mouth every other day.    Marland Kitchen guaiFENesin (MUCINEX) 600 MG 12 hr tablet Take 1 tablet (600 mg total) by mouth 2 (two) times daily as needed for to loosen phlegm or cough. 60 tablet 1  . hydrocortisone 2.5 % cream Apply 1  Application topically 2 (two) times daily as needed (itching).    Marland Kitchen ipratropium (ATROVENT) 0.06 % nasal spray Place 2 sprays into both nostrils 4 (four) times daily. 15 mL 12  . latanoprost (XALATAN) 0.005 % ophthalmic solution Place 1 drop into both eyes at bedtime.    . montelukast (SINGULAIR) 10 MG tablet TAKE 1 TABLET BY MOUTH DAILY AS DIRECTED 90 tablet 1  .  Multiple Vitamin (MULTIVITAMIN) tablet Take 1 tablet by mouth daily.    . pantoprazole (PROTONIX) 40 MG tablet TAKE 1 TABLET BY MOUTH EVERY DAY 90 tablet 1  . Tiotropium Bromide-Olodaterol (STIOLTO RESPIMAT) 2.5-2.5 MCG/ACT AERS Inhale 2 puffs into the lungs daily. 4 g 11  . valsartan (DIOVAN) 160 MG tablet Take 1 tablet (160 mg total) by mouth 2 (two) times daily. 180 tablet 2  . vitamin B-12 (CYANOCOBALAMIN) 1000 MCG tablet Take 1,000 mcg by mouth daily.    . vitamin C (ASCORBIC ACID) 500 MG tablet Take 500 mg by mouth daily.    . Zinc 50 MG CAPS Take 1 capsule by mouth daily.     No current facility-administered medications for this visit.   No results found.  Review of Systems:   A ROS was performed including pertinent positives and negatives as documented in the HPI.  Physical Exam :   Constitutional: NAD and appears stated age Neurological: Alert and oriented Psych: Appropriate affect and cooperative There were no vitals taken for this visit.   Comprehensive Musculoskeletal Exam:    Tenderness to palpation about the lateral deltoid with weakness 4 out of 5 with forward elevation of the supraspinatus.  Negative belly press.  External rotation is to 40 degrees on the right compared to 50 on the left.  Internal rotation is to L3 bilaterally.  Right knee with tenderness to palpation medially.  There is negative crepitus.  Positive McMurray medially.  Range of motion is from 0 to 120 degrees   Imaging:   Xray (4 views right knee, 3 views right shoulder): Normal right shoulder, medial osteoarthritis tibiofemoral joint right  knee   I personally reviewed and interpreted the radiographs.   Assessment and Plan:   78 y.o. female with right knee pain consistent with medial mild osteoarthritis.  At today's visit I did recommend an initial ultrasound-guided injection of the right knee.  Regard to the right shoulder given the fact she did have an acute injury I am concerned for right shoulder rotator cuff tear.  I would like to obtain an MRI of this and see her back to follow-up to discuss results  -Plan for MRI right shoulder and follow-up to discuss results    Procedure Note  Patient: [name]             Date of Birth: 1945/02/17           MRN: [id number]             Visit Date: 04/06/2023  Procedures: Visit Diagnoses:  1. Chronic right shoulder pain     Large Joint Inj: R knee on 04/06/2023 5:12 PM Indications: pain Details: 22 G 1.5 in needle, ultrasound-guided anterior approach  Arthrogram: No  Medications: 4 mL lidocaine 1 %; 80 mg triamcinolone acetonide 40 MG/ML Outcome: tolerated well, no immediate complications Procedure, treatment alternatives, risks and benefits explained, specific risks discussed. Consent was given by the patient. Immediately prior to procedure a time out was called to verify the correct patient, procedure, equipment, support staff and site/side marked as required. Patient was prepped and draped in the usual sterile fashion.        I personally saw and evaluated the patient, and participated in the management and treatment plan.  Huel Cote, MD Attending Physician, Orthopedic Surgery  This document was dictated using Dragon voice recognition software. A reasonable attempt at proof reading has been made to minimize errors.

## 2023-04-09 ENCOUNTER — Other Ambulatory Visit: Payer: Self-pay | Admitting: Family Medicine

## 2023-04-12 ENCOUNTER — Other Ambulatory Visit: Payer: Self-pay | Admitting: Family Medicine

## 2023-04-12 NOTE — Telephone Encounter (Signed)
 Spoke to pt, scheduled cpe for 05/10/23

## 2023-04-12 NOTE — Telephone Encounter (Signed)
 Patient is due for CPE with Dr. Para March. Please call patient and schedule and then route back for refill.  Thanks!

## 2023-04-21 ENCOUNTER — Ambulatory Visit
Admission: RE | Admit: 2023-04-21 | Discharge: 2023-04-21 | Disposition: A | Discharge status: Home / Self Care | Source: Ambulatory Visit | Attending: Family Medicine | Admitting: Family Medicine

## 2023-04-21 DIAGNOSIS — Z1231 Encounter for screening mammogram for malignant neoplasm of breast: Secondary | ICD-10-CM

## 2023-04-27 ENCOUNTER — Telehealth (HOSPITAL_BASED_OUTPATIENT_CLINIC_OR_DEPARTMENT_OTHER): Payer: Self-pay | Admitting: Orthopaedic Surgery

## 2023-04-27 NOTE — Telephone Encounter (Signed)
 Patient states she has Pacemaker and [facility] said she has to go to hospital to get it done. Please contact patient  and send referral to hospital

## 2023-05-03 ENCOUNTER — Ambulatory Visit (INDEPENDENT_AMBULATORY_CARE_PROVIDER_SITE_OTHER): Payer: Medicare Other

## 2023-05-03 DIAGNOSIS — I441 Atrioventricular block, second degree: Secondary | ICD-10-CM

## 2023-05-03 LAB — CUP PACEART REMOTE DEVICE CHECK
Battery Remaining Longevity: 90 mo
Battery Remaining Percentage: 84 %
Battery Voltage: 2.99 V
Brady Statistic AP VP Percent: 44 %
Brady Statistic AP VS Percent: 1 %
Brady Statistic AS VP Percent: 53 %
Brady Statistic AS VS Percent: 1.9 %
Brady Statistic RA Percent Paced: 44 %
Brady Statistic RV Percent Paced: 97 %
Date Time Interrogation Session: 20250325020014
Implantable Lead Connection Status: 753985
Implantable Lead Connection Status: 753985
Implantable Lead Implant Date: 20230623
Implantable Lead Implant Date: 20230623
Implantable Lead Location: 753859
Implantable Lead Location: 753860
Implantable Pulse Generator Implant Date: 20230623
Lead Channel Impedance Value: 410 Ohm
Lead Channel Impedance Value: 430 Ohm
Lead Channel Pacing Threshold Amplitude: 0.625 V
Lead Channel Pacing Threshold Amplitude: 1.625 V
Lead Channel Pacing Threshold Pulse Width: 0.5 ms
Lead Channel Pacing Threshold Pulse Width: 0.5 ms
Lead Channel Sensing Intrinsic Amplitude: 12 mV
Lead Channel Sensing Intrinsic Amplitude: 5 mV
Lead Channel Setting Pacing Amplitude: 1.625
Lead Channel Setting Pacing Amplitude: 1.875
Lead Channel Setting Pacing Pulse Width: 0.5 ms
Lead Channel Setting Sensing Sensitivity: 2 mV
Pulse Gen Model: 2272
Pulse Gen Serial Number: 8092211

## 2023-05-04 ENCOUNTER — Encounter (HOSPITAL_BASED_OUTPATIENT_CLINIC_OR_DEPARTMENT_OTHER): Payer: Self-pay | Admitting: Orthopaedic Surgery

## 2023-05-04 ENCOUNTER — Ambulatory Visit (HOSPITAL_BASED_OUTPATIENT_CLINIC_OR_DEPARTMENT_OTHER)

## 2023-05-04 ENCOUNTER — Ambulatory Visit (HOSPITAL_BASED_OUTPATIENT_CLINIC_OR_DEPARTMENT_OTHER): Payer: Medicare Other | Admitting: Orthopaedic Surgery

## 2023-05-04 DIAGNOSIS — M160 Bilateral primary osteoarthritis of hip: Secondary | ICD-10-CM | POA: Diagnosis not present

## 2023-05-04 DIAGNOSIS — M25552 Pain in left hip: Secondary | ICD-10-CM | POA: Diagnosis not present

## 2023-05-04 DIAGNOSIS — M1612 Unilateral primary osteoarthritis, left hip: Secondary | ICD-10-CM | POA: Diagnosis not present

## 2023-05-04 MED ORDER — LIDOCAINE HCL 1 % IJ SOLN
4.0000 mL | INTRAMUSCULAR | Status: AC | PRN
  Administered 2023-05-04: 4 mL

## 2023-05-04 MED ORDER — TRIAMCINOLONE ACETONIDE 40 MG/ML IJ SUSP
80.0000 mg | INTRAMUSCULAR | Status: AC | PRN
  Administered 2023-05-04: 80 mg via INTRA_ARTICULAR

## 2023-05-04 NOTE — Progress Notes (Signed)
 Chief Complaint: Left hip pain     History of Present Illness:   05/04/2023: Presents today for follow-up predominantly of her left hip.  This has been painful deep inside the femoral acetabular joint.  She does not have any previous history of injections.  She has been working with her fitness coach although this has been limited particularly when she goes from sit to stand  [name] is a 78 y.o. female presents today with right shoulder as well as right knee pain.  He has been both hurting over the last several years.  She has been seeing an orthopedist in [place].  She has previously had injections in the knee which did give her some temporary relief.  She is here today for further discussion.  She has recently moved to [place] as her husband has unfortunately passed.  She has had anti-inflammatories in the past.  She states that the right shoulder has been weekly painful for the last several months after she attempted to lift a weight of this unexpectedly heavier while at church.  Since that time she has had pain and limited overhead motion    PMH/PSH/Family History/Social History/Meds/Allergies:    Past Medical History:  Diagnosis Date  . HLD (hyperlipidemia)   . HTN (hypertension) 08/01/2021  . IBS (irritable bowel syndrome)   . Osteoporosis   . Sinusitis   . 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 Symptomatic bradycardia 08/01/2021   Past Surgical History:  Procedure Laterality Date  . CATARACT EXTRACTION    . NASAL SINUS SURGERY     several  . PACEMAKER IMPLANT N/A 07/31/2021   Procedure: PACEMAKER IMPLANT;  Surgeon: Regan Lemming, MD;  Location: [facility];  Service: Cardiovascular;  Laterality: N/A;  . toenail removal      left great toenail  . TUBAL LIGATION     Social History   Socioeconomic History  . Marital status: Widowed    Spouse name: Not on file  .  Number of children: Not on file  . Years of education: Not on file  . Highest education level: Some college, no degree  Occupational History  . Not on file  Tobacco Use  . Smoking status: Never    Passive exposure: Past  . Smokeless tobacco: Never  Vaping Use  . Vaping status: Never Used  Substance and Sexual Activity  . Alcohol use: No  . Drug use: No  . Sexual activity: Not Currently  Other Topics Concern  . Not on file  Social History Narrative   Widowed 2022.     Lived in [place] for 50 years.     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   Social Drivers of Health   Financial Resource Strain: Low Risk  (10/21/2022)   Overall Financial Resource Strain (CARDIA)   . Difficulty of Paying Living Expenses: Not hard at all  Food Insecurity: No Food Insecurity (10/21/2022)   Hunger Vital Sign   . Worried About Programme researcher, broadcasting/film/video in the Last Year: Never true   . Ran Out of Food in the Last Year: Never true  Transportation Needs: No Transportation Needs (10/21/2022)   PRAPARE - Transportation   . Lack of Transportation (Medical): No   . Lack of Transportation (Non-Medical): No  Physical Activity: Sufficiently Active (10/21/2022)   Exercise Vital Sign   . Days of Exercise per Week: 3 days   .  Minutes of Exercise per Session: 60 min  Stress: No Stress Concern Present (10/21/2022)   [employer] of Occupational Health - Occupational Stress Questionnaire   . Feeling of Stress : Not at all  Social Connections: Moderately Integrated (10/21/2022)   Social Connection and Isolation Panel [NHANES]   . Frequency of Communication with Friends and Family: More than three times a week   . Frequency of Social Gatherings with Friends and Family: More than three times a week   . Attends Religious Services: More than 4 times per year   . Active Member of Clubs or Organizations: Yes   . Attends Banker Meetings: More than 4 times per year   . Marital  Status: Widowed   Family History  Problem Relation Age of Onset  . Stroke Mother   . Hypertension Mother   . Breast cancer Sister   . Kidney failure Son   . Heart disease Son        heart transplant  . Other Son        open heart surgery at 69 months old  . Colon cancer Neg Hx    Allergies  Allergen Reactions  . Morphine Other (See Comments)  . Azithromycin     Other reaction(s): gi distress, GI intolerance, Other  . Hydrochlorothiazide Other (See Comments)    Not a allergy, but hyponatremia induced seizure with HCTZ 2022  . Levofloxacin Hives and Rash  . Sulfa Antibiotics Hives    Red patches on legs years ago   . Ciprofloxacin     rash   Current Outpatient Medications  Medication Sig Dispense Refill  . acetaminophen (TYLENOL) 650 MG CR tablet Take 650 mg by mouth every 8 (eight) hours as needed for pain.    Marland Kitchen albuterol (VENTOLIN HFA) 108 (90 Base) MCG/ACT inhaler Inhale 2 puffs into the lungs every 6 (six) hours as needed for wheezing or shortness of breath. 8 g 11  . alendronate (FOSAMAX) 70 MG tablet Take 1 tablet (70 mg total) by mouth once a week. Take with a full glass of water on an empty stomach. 13 tablet 3  . amLODipine (NORVASC) 10 MG tablet Take 1 tablet (10 mg total) by mouth daily. 90 tablet 3  . atorvastatin (LIPITOR) 20 MG tablet TAKE 1 TABLET BY MOUTH EVERY DAY 90 tablet 0  . Azelastine HCl 137 MCG/SPRAY SOLN USE 2 SPRAYS NASALLY TWICE A DAY 30 mL 5  . cetirizine (ZYRTEC) 10 MG tablet Take 10 mg by mouth daily as needed for allergies.    . Chlorphen-Pseudoephed-APAP (CORICIDIN D PO) Take by mouth.    . clotrimazole (MYCELEX) 10 MG troche TAKE 1 TABLET (10 MG TOTAL) BY MOUTH 5 (FIVE) TIMES DAILY. REPEAT COURSE IF NEEDED 70 Troche 0  . dicyclomine (BENTYL) 20 MG tablet TAKE 1 TABLET BY MOUTH THREE TIMES A DAY AS NEEDED 270 tablet 1  . dorzolamide (TRUSOPT) 2 % ophthalmic solution SMARTSIG:In Eye(s)    . FLUoxetine (PROZAC) 10 MG tablet Take 1 tablet (10 mg  total) by mouth daily. 90 tablet 3  . GARLIC PO Take 1 tablet by mouth every other day.    Marland Kitchen guaiFENesin (MUCINEX) 600 MG 12 hr tablet Take 1 tablet (600 mg total) by mouth 2 (two) times daily as needed for to loosen phlegm or cough. 60 tablet 1  . hydrocortisone 2.5 % cream Apply 1 Application topically 2 (two) times daily as needed (itching).    Marland Kitchen ipratropium (ATROVENT) 0.06 % nasal  spray Place 2 sprays into both nostrils 4 (four) times daily. 15 mL 12  . latanoprost (XALATAN) 0.005 % ophthalmic solution Place 1 drop into both eyes at bedtime.    . montelukast (SINGULAIR) 10 MG tablet TAKE 1 TABLET BY MOUTH DAILY AS DIRECTED 90 tablet 1  . Multiple Vitamin (MULTIVITAMIN) tablet Take 1 tablet by mouth daily.    . pantoprazole (PROTONIX) 40 MG tablet TAKE 1 TABLET BY MOUTH EVERY DAY 90 tablet 1  . Tiotropium Bromide-Olodaterol (STIOLTO RESPIMAT) 2.5-2.5 MCG/ACT AERS Inhale 2 puffs into the lungs daily. 4 g 11  . valsartan (DIOVAN) 160 MG tablet TAKE 1 TABLET BY MOUTH 2 TIMES DAILY. 180 tablet 2  . vitamin B-12 (CYANOCOBALAMIN) 1000 MCG tablet Take 1,000 mcg by mouth daily.    . vitamin C (ASCORBIC ACID) 500 MG tablet Take 500 mg by mouth daily.    . Zinc 50 MG CAPS Take 1 capsule by mouth daily.     No current facility-administered medications for this visit.   CUP PACEART REMOTE DEVICE CHECK Result Date: 05/03/2023 Scheduled remote reviewed. Normal device function.  Presenting rhythm:  AP-VP.  10 logged AHR detections, longest 32 seconds, some EGMs false due to far field oversensing, some ST, some atrial senses in refractory causing inappropriate atrial pacing with  functional noncapture, some AT. Next remote 91 days. - CS, CVRS   Review of Systems:   A ROS was performed including pertinent positives and negatives as documented in the HPI.  Physical Exam :   Constitutional: NAD and appears stated age Neurological: Alert and oriented Psych: Appropriate affect and cooperative There were no  vitals taken for this visit.   Comprehensive Musculoskeletal Exam:    Left hip with a positive FADIR with approximately 30 degrees internal/external rotation of the hip with some catching and pain.   Imaging:   Xray (4 views left hip): Mild femoral acetabular left hip osteoarthritis   I personally reviewed and interpreted the radiographs.   Assessment and Plan:   78 y.o. 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  At today's visit I did recommend ultrasound-guided injection of the left hip.  She would like to proceed with this after verbal consent was obtained  -Left hip injection provided, she will follow-up with me as needed    Procedure Note  Patient: [name]             Date of Birth: 04-05-1945           MRN: [id number]             Visit Date: 05/04/2023  Procedures: Visit Diagnoses:  1. Pain of left hip     Large Joint Inj: L hip joint on 05/04/2023 2:42 PM Indications: pain Details: 22 G 3.5 in needle, ultrasound-guided anterolateral approach  Arthrogram: No  Medications: 4 mL lidocaine 1 %; 80 mg triamcinolone acetonide 40 MG/ML Outcome: tolerated well, no immediate complications Procedure, treatment alternatives, risks and benefits explained, specific risks discussed. Consent was given by the patient. Immediately prior to procedure a time out was called to verify the correct patient, procedure, equipment, support staff and site/side marked as required. Patient was prepped and draped in the usual sterile fashion.       I personally saw and evaluated the patient, and participated in the management and treatment plan.  Huel Cote, MD Attending Physician, Orthopedic Surgery  This document was dictated using Dragon voice recognition software. A reasonable attempt at proof  reading has been made to minimize errors.

## 2023-05-09 DIAGNOSIS — L639 Alopecia areata, unspecified: Secondary | ICD-10-CM | POA: Diagnosis not present

## 2023-05-10 ENCOUNTER — Encounter: Admitting: Family Medicine

## 2023-05-11 ENCOUNTER — Other Ambulatory Visit: Payer: Self-pay | Admitting: Family Medicine

## 2023-05-11 NOTE — Telephone Encounter (Signed)
 Sent. Thanks.

## 2023-05-11 NOTE — Telephone Encounter (Signed)
 LOV: 03/23/23 NOV: 05/31/23 LAST REFILL: clotrimazole (MYCELEX) 10 MG troche 03/15/23 70 troche 0 refills

## 2023-05-14 ENCOUNTER — Other Ambulatory Visit: Payer: Self-pay | Admitting: Family Medicine

## 2023-05-19 ENCOUNTER — Encounter: Payer: Self-pay | Admitting: Family Medicine

## 2023-05-19 ENCOUNTER — Ambulatory Visit (INDEPENDENT_AMBULATORY_CARE_PROVIDER_SITE_OTHER): Admitting: Family Medicine

## 2023-05-19 VITALS — BP 110/60 | HR 71 | Temp 98.8°F | Ht 64.5 in | Wt 137.0 lb

## 2023-05-19 DIAGNOSIS — J069 Acute upper respiratory infection, unspecified: Secondary | ICD-10-CM

## 2023-05-19 DIAGNOSIS — B370 Candidal stomatitis: Secondary | ICD-10-CM

## 2023-05-19 MED ORDER — FLUCONAZOLE 100 MG PO TABS: ORAL_TABLET | ORAL | 0 refills | Status: DC

## 2023-05-19 MED ORDER — AMOXICILLIN-POT CLAVULANATE 875-125 MG PO TABS
1.0000 | ORAL_TABLET | Freq: Two times a day (BID) | ORAL | 0 refills | Status: DC
Start: 2023-05-19 — End: 2023-05-31

## 2023-05-19 NOTE — Progress Notes (Signed)
 She is rinsing after using her inhaler.  She has had recurrent thrush.  Mycelex would help temporarily.    Other sx started few days ago.  First noted fatigue.  Then had voice changes. She had lost her voice then that got better in the meantime.  Chest was sore.  Has been resting in the meantime.  Still eating at baseline.  Some cough and some scant episodic sputum.  Post nasal gtt.  Rhinorrhea.  Facial pain, B maxillary area.  No ear pain.  No fevers.    She had h/o thrush in the past.   She talked to me about her situation with her son.  She wrote a letter in support of his early release.  I read and signed the letter after talking to her.  She kept the original and I have a copy to be scanned in her chart.  Meds, vitals, and allergies reviewed.   ROS: Per HPI unless specifically indicated in ROS section   Nad Ncat ncat TM wnl B Max > frontal sinuses ttp B OP with posterior irritation but not obvious thrush.  Neck supple no LA Rrr Ctab Skin well-perfused.

## 2023-05-19 NOTE — Patient Instructions (Addendum)
 Start augmentin.  Rest and fluids.  If you have more trouble with thrush, you could change to diflucan.  Keep using your mouthwash.   If you keep having trouble with thrush, then ask about seeing Dr. Pollyann Kennedy.  Take care.  Glad to see you.

## 2023-05-20 ENCOUNTER — Encounter: Payer: Self-pay | Admitting: Nurse Practitioner

## 2023-05-20 ENCOUNTER — Ambulatory Visit: Payer: Medicare Other | Admitting: Nurse Practitioner

## 2023-05-20 ENCOUNTER — Other Ambulatory Visit (INDEPENDENT_AMBULATORY_CARE_PROVIDER_SITE_OTHER)

## 2023-05-20 VITALS — BP 108/60 | HR 76 | Ht 62.0 in | Wt 137.4 lb

## 2023-05-20 DIAGNOSIS — R1310 Dysphagia, unspecified: Secondary | ICD-10-CM

## 2023-05-20 DIAGNOSIS — K589 Irritable bowel syndrome without diarrhea: Secondary | ICD-10-CM

## 2023-05-20 DIAGNOSIS — R140 Abdominal distension (gaseous): Secondary | ICD-10-CM | POA: Diagnosis not present

## 2023-05-20 DIAGNOSIS — K219 Gastro-esophageal reflux disease without esophagitis: Secondary | ICD-10-CM

## 2023-05-20 DIAGNOSIS — R1013 Epigastric pain: Secondary | ICD-10-CM

## 2023-05-20 LAB — COMPREHENSIVE METABOLIC PANEL WITH GFR
ALT: 34 U/L (ref 0–35)
AST: 20 U/L (ref 0–37)
Albumin: 4.3 g/dL (ref 3.5–5.2)
Alkaline Phosphatase: 66 U/L (ref 39–117)
BUN: 18 mg/dL (ref 6–23)
CO2: 28 meq/L (ref 19–32)
Calcium: 9.7 mg/dL (ref 8.4–10.5)
Chloride: 101 meq/L (ref 96–112)
Creatinine, Ser: 0.71 mg/dL (ref 0.40–1.20)
GFR: 80.72 mL/min (ref >60.00)
Glucose, Bld: 85 mg/dL (ref 70–99)
Potassium: 3.7 meq/L (ref 3.5–5.1)
Sodium: 136 meq/L (ref 135–145)
Total Bilirubin: 0.4 mg/dL (ref 0.2–1.2)
Total Protein: 8 g/dL (ref 6.0–8.3)

## 2023-05-20 LAB — CBC WITH DIFFERENTIAL/PLATELET
Basophils Absolute: 0.1 10*3/uL (ref 0.0–0.1)
Basophils Relative: 0.9 % (ref 0.0–3.0)
Eosinophils Absolute: 0.1 10*3/uL (ref 0.0–0.7)
Eosinophils Relative: 0.8 % (ref 0.0–5.0)
HCT: 38.6 % (ref 36.0–46.0)
Hemoglobin: 13 g/dL (ref 12.0–15.0)
Lymphocytes Relative: 15.9 % (ref 12.0–46.0)
Lymphs Abs: 1.4 10*3/uL (ref 0.7–4.0)
MCHC: 33.6 g/dL (ref 30.0–36.0)
MCV: 93.4 fl (ref 78.0–100.0)
Monocytes Absolute: 0.8 10*3/uL (ref 0.1–1.0)
Monocytes Relative: 8.9 % (ref 3.0–12.0)
Neutro Abs: 6.3 10*3/uL (ref 1.4–7.7)
Neutrophils Relative %: 73.5 % (ref 43.0–77.0)
Platelets: 260 10*3/uL (ref 150.0–400.0)
RBC: 4.13 Mil/uL (ref 3.87–5.11)
RDW: 15.2 % (ref 11.5–15.5)
WBC: 8.6 10*3/uL (ref 4.0–10.5)

## 2023-05-20 NOTE — Progress Notes (Signed)
 05/20/2023 [name] [id number] 1945/11/18   CHIEF COMPLAINT: Diarrhea and constipation  HISTORY OF PRESENT ILLNESS: [name] is a 78 year old female with a past medical history of osteoporosis, hypertension, hyperlipidemia, bradycardia s/p pacemaker placement 07/2021, glaucoma, GERD and colon polyps. She presents to our office today as referred by Dr. Crawford Givens for further evaluation regarding irritable bowel syndrome.  She describes having IBS " all of my life".  She is mostly concerned regarding episodic explosive bowel movements when she passes a solid stool followed by 2-3 episodes of nonbloody diarrhea with increased urgency.  She intermittently has epigastric and or LLQ pain and always feels bloated.  The last episode of urgent multiple bowel movements occurred 1 week ago after she ate cheese and crackers.  She stopped eating ice cream and Greek yogurt which seemed to cause more stomach pain.  She acknowledges her anxiety levels may intermittently trigger her GI symptoms.  She continues to grieve the loss of her husband and son.  She has a history of acid reflux for which she takes Pantoprazole 40 mg daily.  She has infrequent heartburn.  She previously had issues with dysphagia, food sometimes would get hung up in her throat which passed if she drank a few sips of water.  She also noted having postnasal drainage which irritated her throat.  She underwent a modified swallow study with a speech pathologist 01/18/2023 which did not identify any oropharyngeal dysphagia but showed trace aspiration with sequential thin swallows thought to be due to decreased sustained laryngeal closure and a dedicated esophageal gram was recommended.  A barium swallow study was ordered by Dr. Marlynn Perking 12/29/2022 but the result is not available in epic and it is unclear if this study was actually completed.  However, the patient believes she completed this study.  She possibly underwent an upper  endoscopy more than 10 years ago, further details are unknown.  She believes she underwent 3 colonoscopies in her lifetime.  Her first colonoscopy showed a few benign polyps which were removed and she reported her last 2 colonoscopies were normal.  The most recent colonoscopy was done at the [facility] in [place].         Latest Ref Rng & Units 09/01/2022   12:01 PM 04/05/2022   10:24 AM 03/04/2022   12:46 PM  CBC  WBC 3.8 - 10.8 Thousand/uL 9.0  7.9  9.1   Hemoglobin 11.7 - 15.5 g/dL 40.9  81.1  91.4   Hematocrit 35.0 - 45.0 % 36.9  36.9  36.2   Platelets 140 - 400 Thousand/uL 258  297.0  262        Latest Ref Rng & Units 09/01/2022   12:01 PM 12/28/2021   11:29 AM 08/01/2021    5:46 AM  CMP  Glucose 65 - 99 mg/dL 89  95  92   BUN 7 - 25 mg/dL 13  14  16    Creatinine 0.60 - 1.00 mg/dL 7.82  9.56  2.13   Sodium 135 - 146 mmol/L 138  138  137   Potassium 3.5 - 5.3 mmol/L 4.7  4.1  3.3   Chloride 98 - 110 mmol/L 102  102  105   CO2 20 - 32 mmol/L 30  28  22    Calcium 8.6 - 10.4 mg/dL 9.7  08.6  9.4   Total Protein 6.1 - 8.1 g/dL 7.9     Total Bilirubin 0.2 - 1.2 mg/dL 0.3  AST 10 - 35 U/L 23     ALT 6 - 29 U/L 18        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 Nonsmoker. No alcohol use. No drug use.     Family History: family history includes Breast cancer in her sister; Heart disease in her son; Hypertension in her mother; Kidney failure in her son; Other in her son; Stroke in her mother.    Allergies  Allergen Reactions   Morphine Other (See Comments)   Azithromycin     Other reaction(s): gi distress, GI intolerance, Other   Hydrochlorothiazide Other (See Comments)    Not a allergy, but hyponatremia induced seizure with HCTZ 2022   Levofloxacin Hives and Rash   Sulfa Antibiotics Hives    Red patches on legs years ago    Ciprofloxacin     rash      Outpatient Encounter Medications as of 05/20/2023  Medication Sig   acetaminophen (TYLENOL) 650 MG CR tablet Take 650 mg by mouth every 8 (eight) hours as needed for pain.   albuterol (VENTOLIN HFA) 108 (90 Base) MCG/ACT inhaler Inhale 2 puffs into the lungs every 6 (six) hours as needed for wheezing or shortness of breath.   alendronate (FOSAMAX) 70 MG tablet Take 1 tablet (70 mg total) by mouth once a week. Take with a full glass of water on an empty stomach.   amLODipine (NORVASC) 10 MG tablet TAKE 1 TABLET BY MOUTH EVERY DAY   amoxicillin-clavulanate (AUGMENTIN) 875-125 MG tablet Take 1 tablet by mouth 2 (two) times daily.   atorvastatin (LIPITOR) 20 MG tablet TAKE 1 TABLET BY MOUTH EVERY DAY   Azelastine HCl 137 MCG/SPRAY SOLN USE 2 SPRAYS NASALLY TWICE A DAY   cetirizine (ZYRTEC) 10 MG tablet Take 10 mg by mouth daily as needed for allergies.   dicyclomine (BENTYL) 20 MG tablet TAKE 1 TABLET BY MOUTH THREE TIMES A DAY AS NEEDED   dorzolamide (TRUSOPT) 2 % ophthalmic solution SMARTSIG:In Eye(s)   fluconazole (DIFLUCAN) 100 MG tablet 200 mg on day 1, then 100mg  once daily for 10 days   FLUoxetine (PROZAC) 10 MG tablet Take 1 tablet (10 mg total) by mouth daily.   GARLIC PO Take 1 tablet by mouth every other day.   hydrocortisone 2.5 % cream Apply 1 Application topically 2 (two) times daily as needed (itching).   ipratropium  (ATROVENT) 0.06 % nasal spray Place 2 sprays into both nostrils 4 (four) times daily.   latanoprost (XALATAN) 0.005 % ophthalmic solution Place 1 drop into both eyes at bedtime.   montelukast (SINGULAIR) 10 MG tablet TAKE 1 TABLET BY MOUTH DAILY AS DIRECTED   Multiple Vitamin (MULTIVITAMIN) tablet Take 1 tablet by mouth daily.   pantoprazole (PROTONIX)  40 MG tablet TAKE 1 TABLET BY MOUTH EVERY DAY   Tiotropium Bromide-Olodaterol (STIOLTO RESPIMAT) 2.5-2.5 MCG/ACT AERS Inhale 2 puffs into the lungs daily.   valsartan (DIOVAN) 160 MG tablet TAKE 1 TABLET BY MOUTH 2 TIMES DAILY.   vitamin B-12 (CYANOCOBALAMIN) 1000 MCG tablet Take 1,000 mcg by mouth daily.   vitamin C (ASCORBIC ACID) 500 MG tablet Take 500 mg by mouth daily.   Zinc 50 MG CAPS Take 1 capsule by mouth daily.   Chlorphen-Pseudoephed-APAP (CORICIDIN D PO) Take by mouth. (Patient not taking: Reported on 05/20/2023)   clotrimazole (MYCELEX) 10 MG troche TAKE 1 TABLET (10 MG TOTAL) BY MOUTH 5 (FIVE) TIMES DAILY. REPEAT COURSE IF NEEDED (Patient not taking: Reported on 05/20/2023)   guaiFENesin (MUCINEX) 600 MG 12 hr tablet Take 1 tablet (600 mg total) by mouth 2 (two) times daily as needed for to loosen phlegm or cough. (Patient not taking: Reported on 05/20/2023)   No facility-administered encounter medications on file as of 05/20/2023.   REVIEW OF SYSTEMS:  Gen: Denies fever, sweats or chills. No weight loss.  CV: Denies chest pain, palpitations or edema. Resp: Denies cough, shortness of breath of hemoptysis.  GI: See HPI. GU: Denies urinary burning, blood in urine, increased urinary frequency or incontinence. MS: Denies joint pain, muscles aches or weakness. Derm: Denies rash, itchiness, skin lesions or unhealing ulcers. Psych: Denies depression, anxiety, memory loss or confusion. Heme: Denies bruising, easy bleeding. Neuro:  Denies headaches, dizziness or paresthesias. Endo:  Denies any problems with DM, thyroid or adrenal  function.  PHYSICAL EXAM: Ht 5\' 2"  (1.575 m) Comment: height measured without shoes  Wt 137 lb 6 oz (62.3 kg)   BMI 25.13 kg/m  General: 78 year old female in no acute distress. Head: Normocephalic and atraumatic. Eyes:  Sclerae non-icteric, conjunctive pink. Ears: Normal auditory acuity. Mouth: Dentition intact. No ulcers or lesions.  Neck: Supple, no lymphadenopathy or thyromegaly.  Lungs: Clear bilaterally to auscultation without wheezes, crackles or rhonchi. Heart: Regular rate and rhythm. No murmur, rub or gallop appreciated.  Abdomen: Soft, nontender, nondistended. No masses. No hepatosplenomegaly. Normoactive bowel sounds x 4 quadrants.  Rectal: Deferred.  Musculoskeletal: Symmetrical with no gross deformities. Skin: Warm and dry. No rash or lesions on visible extremities. Extremities: No edema. Neurological: Alert oriented x 4, no focal deficits.  Psychological:  Alert and cooperative. Normal mood and affect.  ASSESSMENT AND PLAN:  77 year old female with chronic IBS with recent explosive bowel movements, passing solid stool followed by 2-3 nonbloody loose stools with increased urgency.  Last episode occurred 1 week ago after eating cheese and crackers. -Take Lactaid 2 tabs with each dairy product -Benefiber 1 tablespoon daily as tolerated -Consider a diagnostic colonoscopy to rule out microscopic colitis if diarrhea persists - CBC, CMP  Abdominal bloat -IBgard 1 tab p.o. twice daily -Consider future SIBO testing if abdominal bloat persists   Reported history of colon polyps.  Patient reported having a few benign colon polyps removed from the colon at the time of her initial screening colonoscopy.  Her last 2 colonoscopies were normal, her most recent colonoscopy was more than 10 years ago. - Request copy of her most recent colonoscopy  Dysphagia. Modified swallow study with a speech pathologist 01/18/2023 did not identify oropharyngeal dysphagia.  A barium swallow study  was ordered by Dr. Marlynn Perking 12/29/2022, patient stated she completed this test but the results are not in EPIC. - Our staff will contact [facility] radiology to verify the barium  swallow study was done, if not we will need to schedule - Consider future EGD  GERD, occasional epigastric pain -Consider future EGD as noted above  - Diatherix H. pylori stool antigen  Further follow-up and recommendations to be determined after the above evaluation completed   CC:  Joaquim Nam, MD

## 2023-05-20 NOTE — Patient Instructions (Addendum)
 IBGuard- take 1 tablet by mouth twice daily  Lactaid- take 2 tablets by mouth with each dairy product (over the counter)  Benefiber- take 1 tablespoon daily (over the counter)  Your provider has requested that you go to the basement level for lab work before leaving today. Press "B" on the elevator. The lab is located at the first door on the left as you exit the elevator.  Your provider has ordered "Diatherix" stool testing for you. You have received a kit from our office today containing all necessary supplies to complete this test. Please carefully read the stool collection instructions provided in the kit before opening the accompanying materials. In addition, be sure there is a label providing your full name and date of birth on the "puritan opti-swab" tube that is supplied in the kit (if you do not see a label with this information on your test tube, please make [place] aware before test collection!). After completing the test, you should secure the purtian tube into the specimen biohazard bag. The [facility] E-Req sheet (including date and time of specimen collection) should be placed into the outside pocket of the specimen biohazard bag and returned to the [place] lab (basement floor of [facility]) within 3 days of collection. Please make sure to give the specimen to a staff member at the lab. DO NOT leave the specimen on the counter.   If the specimen date and time (can be found in the upper right boxed portion of the sheet) are not filled out on the E-Req sheet, the test will NOT be performed.    Due to recent changes in healthcare laws, you may see the results of your imaging and laboratory studies on MyChart before your provider has had a chance to review them.  We understand that in some cases there may be results that are confusing or concerning to you. Not all laboratory results come back in the same time frame and the provider may be waiting for multiple  results in order to interpret others.  Please give [place] 48 hours in order for your provider to thoroughly review all the results before contacting the office for clarification of your results.   Thank you for trusting me with your gastrointestinal care!   Alcide Evener, CRNP

## 2023-05-22 DIAGNOSIS — B370 Candidal stomatitis: Secondary | ICD-10-CM | POA: Insufficient documentation

## 2023-05-22 DIAGNOSIS — J069 Acute upper respiratory infection, unspecified: Secondary | ICD-10-CM | POA: Insufficient documentation

## 2023-05-22 NOTE — Assessment & Plan Note (Signed)
 History of, no obvious lesions seen now. Discussed options.  If she keeps having trouble with thrush, she could change to diflucan.  Advised to keep using a mouthwash. If still having trouble with thrush, then she can ask about seeing Dr. Donalee Fruits.

## 2023-05-22 NOTE — Progress Notes (Signed)
 Agree with the assessment and plan as outlined by Everett Hitt, NP.  Low suspicion for microscopic colitis given formed stools and long standing symptoms.  Would consider trial of low dose TCA if symptoms not improving with fiber supplementation, IBGard.   Aleshia Cartelli E. Cherryl Corona, MD [facility]

## 2023-05-22 NOTE — Assessment & Plan Note (Signed)
 Presumed sinusitis.  Okay for outpatient follow-up. Start augmentin.  Rest and fluids.  Thrush cautions with antibiotics discussed.

## 2023-05-23 DIAGNOSIS — R1013 Epigastric pain: Secondary | ICD-10-CM | POA: Diagnosis not present

## 2023-05-25 NOTE — Progress Notes (Signed)
 Attempted to contact patient regarding lab and stool test results.

## 2023-05-25 NOTE — Telephone Encounter (Signed)
 Patient called and stated that she was returning a call back to DD. Patient is requesting a call back. Please advise.

## 2023-05-26 NOTE — Telephone Encounter (Signed)
 Patient is aware of normal lab and diatherix h pylori stool test results.

## 2023-05-31 ENCOUNTER — Ambulatory Visit (INDEPENDENT_AMBULATORY_CARE_PROVIDER_SITE_OTHER): Admitting: Family Medicine

## 2023-05-31 ENCOUNTER — Encounter: Payer: Self-pay | Admitting: Family Medicine

## 2023-05-31 VITALS — BP 106/62 | HR 66 | Temp 97.9°F | Ht 62.0 in | Wt 135.0 lb

## 2023-05-31 DIAGNOSIS — J45909 Unspecified asthma, uncomplicated: Secondary | ICD-10-CM

## 2023-05-31 DIAGNOSIS — K219 Gastro-esophageal reflux disease without esophagitis: Secondary | ICD-10-CM | POA: Diagnosis not present

## 2023-05-31 DIAGNOSIS — E785 Hyperlipidemia, unspecified: Secondary | ICD-10-CM | POA: Diagnosis not present

## 2023-05-31 DIAGNOSIS — I4430 Unspecified atrioventricular block: Secondary | ICD-10-CM

## 2023-05-31 DIAGNOSIS — Z0000 Encounter for general adult medical examination without abnormal findings: Secondary | ICD-10-CM

## 2023-05-31 DIAGNOSIS — F419 Anxiety disorder, unspecified: Secondary | ICD-10-CM

## 2023-05-31 DIAGNOSIS — J069 Acute upper respiratory infection, unspecified: Secondary | ICD-10-CM | POA: Diagnosis not present

## 2023-05-31 DIAGNOSIS — M8589 Other specified disorders of bone density and structure, multiple sites: Secondary | ICD-10-CM

## 2023-05-31 DIAGNOSIS — I10 Essential (primary) hypertension: Secondary | ICD-10-CM | POA: Diagnosis not present

## 2023-05-31 DIAGNOSIS — Z7189 Other specified counseling: Secondary | ICD-10-CM

## 2023-05-31 LAB — LIPID PANEL
Cholesterol: 154 mg/dL (ref 0–200)
HDL: 66 mg/dL (ref >39.00)
LDL Cholesterol: 76 mg/dL (ref 0–99)
NonHDL: 87.85
Total CHOL/HDL Ratio: 2
Triglycerides: 61 mg/dL (ref 0.0–149.0)
VLDL: 12.2 mg/dL (ref 0.0–40.0)

## 2023-05-31 LAB — VITAMIN D 25 HYDROXY (VIT D DEFICIENCY, FRACTURES): VITD: 38.73 ng/mL (ref 30.00–100.00)

## 2023-05-31 MED ORDER — AZELASTINE HCL 137 MCG/SPRAY NA SOLN: 2.0000 | Freq: Every day | NASAL | 12 refills | Status: DC

## 2023-05-31 MED ORDER — IPRATROPIUM BROMIDE 0.06 % NA SOLN: 2.0000 | Freq: Four times a day (QID) | NASAL | Status: DC

## 2023-05-31 MED ORDER — STIOLTO RESPIMAT 2.5-2.5 MCG/ACT IN AERS: 2.0000 | INHALATION_SPRAY | Freq: Every day | RESPIRATORY_TRACT | 11 refills | Status: DC

## 2023-05-31 MED ORDER — IPRATROPIUM BROMIDE 0.06 % NA SOLN: 2.0000 | Freq: Every day | NASAL | 12 refills | Status: DC

## 2023-05-31 MED ORDER — STIOLTO RESPIMAT 2.5-2.5 MCG/ACT IN AERS: 2.0000 | INHALATION_SPRAY | Freq: Every day | RESPIRATORY_TRACT | 3 refills | Status: DC

## 2023-05-31 MED ORDER — PANTOPRAZOLE SODIUM 40 MG PO TBEC: 40.0000 mg | DELAYED_RELEASE_TABLET | Freq: Every day | ORAL | 4 refills | Status: DC

## 2023-05-31 NOTE — Patient Instructions (Addendum)
 Finish alendronate  fall 2025.  Likely recheck bone density test 2026.   Tetanus shingles and RSV when possible/feeling better.   Go to the lab on the way out.   If you have mychart we'll likely use that to update you.    Take care.  Glad to see you. Please call about seeing Dr. Donalee Fruits.

## 2023-05-31 NOTE — Progress Notes (Signed)
 She is done with recent abx course.  Still with discolored rhinorrhea, some better but not resolved.  No recent thrush.  Discussed with patient about ENT follow-up.  Hypertension:    Using medication without problems or lightheadedness: yes Chest pain with exertion:no Edema: occ Short of breath: not usually, recent illness noted.   No syncope.    Some cramping in the legs at night.  Normal B DP and PT pulses and no claudication.  H/o pacer.  She needs cardiology f/u.  Discussed.  I will ask for cardiology input.  No syncope.  Elevated Cholesterol: Using medications without problems:yes Muscle aches: no Diet compliance: yes Exercise: yes Labs pending.   Osteopenia by DXA with compression fx noted.  DXA 2024.  She is going to finish 5th year of fosamax  2025.  No ADE on med.    Mammogram 2025.   DXA 2024.   Living will d/w pt. If incapacitated, she would have son [employer].  Tetanus 2006.  D/w pt.   PNA prev done.  Covid prev done.  Flu 2024.  RSV d/w pt.   Shingles d/w pt.    Mood d/w pt.  Still on prozac . Stressors d/w pt.  She had visited her husband's grave, d/w pt.  No SI/HI.    GERD controlled on PPI.    Asthma.  She is using stiolto at baseline.  Compliant.  Rare use of SABA.  Still on singulair .    Meds, vitals, and allergies reviewed.   ROS: Per HPI unless specifically indicated in ROS section   Nad Ncat Neck supple, no LA Rrr Ctab Post nasal gtt with nasal congestion.  OP w/o thrush.   TM wnl B Abdomen soft. Skin well-perfused. Extremities without edema.  See notes on labs.

## 2023-06-01 ENCOUNTER — Encounter: Payer: Self-pay | Admitting: Family Medicine

## 2023-06-01 DIAGNOSIS — Z0000 Encounter for general adult medical examination without abnormal findings: Secondary | ICD-10-CM | POA: Insufficient documentation

## 2023-06-01 NOTE — Assessment & Plan Note (Signed)
Continue atorvastatin.  Continue work on diet and exercise. 

## 2023-06-01 NOTE — Assessment & Plan Note (Signed)
 Continue pantoprazole  as is.

## 2023-06-01 NOTE — Assessment & Plan Note (Signed)
 I asked for her to see about follow-up with ENT.  She said she can check on that.

## 2023-06-01 NOTE — Assessment & Plan Note (Signed)
 Mammogram 2025.   DXA 2024.   Living will d/w pt. If incapacitated, she would have son [employer].  Tetanus 2006.  D/w pt.   PNA prev done.  Covid prev done.  Flu 2024.  RSV d/w pt.   Shingles d/w pt.

## 2023-06-01 NOTE — Assessment & Plan Note (Signed)
 Finish alendronate  fall 2025.  Likely recheck bone density test 2026.

## 2023-06-01 NOTE — Assessment & Plan Note (Signed)
Living will d/w pt.  If incapacitated, she would have son [employer].

## 2023-06-01 NOTE — Assessment & Plan Note (Signed)
Continue amlodipine and valsartan

## 2023-06-01 NOTE — Assessment & Plan Note (Signed)
 Will ask for cardiology follow-up.  No syncope.

## 2023-06-01 NOTE — Assessment & Plan Note (Signed)
 Still on prozac . Stressors d/w pt.  She had visited her husband's grave, d/w pt.  No SI/HI.   Support offered.  Would continue Prozac  as is.  Okay for outpatient follow-up.

## 2023-06-01 NOTE — Assessment & Plan Note (Signed)
 She is using stiolto at baseline.  Compliant.  Rare use of SABA.  Still on singulair .   Symptoms controlled.  Continue as is.

## 2023-06-03 ENCOUNTER — Encounter: Payer: Self-pay | Admitting: Nurse Practitioner

## 2023-06-09 DIAGNOSIS — J329 Chronic sinusitis, unspecified: Secondary | ICD-10-CM | POA: Diagnosis not present

## 2023-06-10 ENCOUNTER — Ambulatory Visit: Attending: Pulmonary Disease | Admitting: Pulmonary Disease

## 2023-06-10 ENCOUNTER — Encounter: Payer: Self-pay | Admitting: Pulmonary Disease

## 2023-06-10 VITALS — BP 124/86 | HR 62 | Ht 62.0 in | Wt 135.0 lb

## 2023-06-10 DIAGNOSIS — I10 Essential (primary) hypertension: Secondary | ICD-10-CM

## 2023-06-10 DIAGNOSIS — I4430 Unspecified atrioventricular block: Secondary | ICD-10-CM

## 2023-06-10 LAB — CUP PACEART INCLINIC DEVICE CHECK
Date Time Interrogation Session: 20250502175422
Implantable Lead Connection Status: 753985
Implantable Lead Connection Status: 753985
Implantable Lead Implant Date: 20230623
Implantable Lead Implant Date: 20230623
Implantable Lead Location: 753859
Implantable Lead Location: 753860
Implantable Pulse Generator Implant Date: 20230623
Pulse Gen Model: 2272
Pulse Gen Serial Number: 8092211

## 2023-06-10 NOTE — Patient Instructions (Signed)
 Medication Instructions:  Your physician recommends that you continue on your current medications as directed. Please refer to the Current Medication list given to you today.  *If you need a refill on your cardiac medications before your next appointment, please call your pharmacy*  Lab Work: None ordered If you have labs (blood work) drawn today and your tests are completely normal, you will receive your results only by: MyChart Message (if you have MyChart) OR A paper copy in the mail If you have any lab test that is abnormal or we need to change your treatment, we will call you to review the results.  Follow-Up: At [facility], you and your health needs are our priority.  As part of our continuing mission to provide you with exceptional heart care, our providers are all part of one team.  This team includes your primary Cardiologist (physician) and Advanced Practice Providers or APPs (Physician Assistants and Nurse Practitioners) who all work together to provide you with the care you need, when you need it.  Your next appointment:   1 year(s)  Provider:   Agatha Horsfall, MD

## 2023-06-10 NOTE — Progress Notes (Signed)
  Electrophysiology Office Note:   Date:  06/10/2023  ID:  [name], DOB [birth year], MRN [id number]  Primary Cardiologist: Wendie Hamburg, MD Primary Heart Failure: None Electrophysiologist: Will Cortland Ding, MD       History of Present Illness:   [name] is a 78 y.o. female with h/o CHB s/p PPM, HTN seen today for routine electrophysiology followup.   Since last being seen in our clinic the patient reports she has had a lot of changes in the last year. Her husband passed away, she has moved back to [place] and one of her son's is incarcerated. She reports she has anxiety. She wants to do everything she can to take care of herself and be healthy.  She has questions about her conduction and why it was not working well to require a pacemaker.   She denies chest pain, palpitations, dyspnea, PND, orthopnea, nausea, vomiting, dizziness, syncope, edema, weight gain, or early satiety.   Review of systems complete and found to be negative unless listed in HPI.   EP Information / Studies Reviewed:    EKG is ordered today. Personal review as below.  EKG Interpretation Date/Time:  Friday Jun 10 2023 16:10:39 EDT Ventricular Rate:  62 PR Interval:  200 QRS Duration:  130 QT Interval:  420 QTC Calculation: 426 R Axis:   104  Text Interpretation: Atrial-sensed ventricular-paced rhythm Confirmed by Creighton Doffing ([id number]) on 06/10/2023 4:19:54 PM   PPM Interrogation-  reviewed in detail today,  See PACEART report.  Device History: Abbott Dual Chamber PPM implanted 07/31/21 for Second Degree AVB / intermittent CHB  Studies:  ECHO 07/2021 > LVEF 60-65%, LA mild-mod dilation   Arrhythmia / AAD CHB s/p PPM           Physical Exam:   VS:  BP 124/86 (BP Location: Right Arm, Patient Position: Sitting, Cuff Size: Normal)   Pulse 62   Ht 5\' 2"  (1.575 m)   Wt 135 lb (61.2 kg)   SpO2 97%   BMI 24.69 kg/m    Wt Readings from Last 3 Encounters:  06/10/23 135 lb (61.2  kg)  05/31/23 135 lb (61.2 kg)  05/20/23 137 lb 6 oz (62.3 kg)     GEN: Well nourished, well developed in no acute distress NECK: No JVD; No carotid bruits CARDIAC: Regular rate and rhythm, no murmurs, rubs, gallops RESPIRATORY:  Clear to auscultation without rales, wheezing or rhonchi  ABDOMEN: Soft, non-tender, non-distended EXTREMITIES:  No edema; No deformity   ASSESSMENT AND PLAN:    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 Lawana Pray in 12 months  Signed, Creighton Doffing, NP-C, AGACNP-BC [facility] - Electrophysiology  06/10/2023, 6:34 PM

## 2023-06-17 NOTE — Progress Notes (Signed)
 Remote pacemaker transmission.

## 2023-06-22 DIAGNOSIS — H401131 Primary open-angle glaucoma, bilateral, mild stage: Secondary | ICD-10-CM | POA: Diagnosis not present

## 2023-06-24 ENCOUNTER — Other Ambulatory Visit: Payer: Self-pay | Admitting: Family Medicine

## 2023-06-24 MED ORDER — FLUCONAZOLE 100 MG PO TABS: ORAL_TABLET | ORAL | 0 refills | Status: DC

## 2023-06-24 NOTE — Telephone Encounter (Signed)
 Please get update on patient about prev use, response, and current symptoms.  Thanks.

## 2023-06-24 NOTE — Telephone Encounter (Signed)
 Copied from CRM [id number]. Topic: Clinical - Medication Refill >> Jun 24, 2023 10:26 AM Albertha Alosa wrote: Medication: fluconazole  (DIFLUCAN ) 100 MG tablet  Has the patient contacted their pharmacy? Yes (Agent: If no, request that the patient contact the pharmacy for the refill. If patient does not wish to contact the pharmacy document the reason why and proceed with request.) (Agent: If yes, when and what did the pharmacy advise?)  This is the patient's preferred pharmacy:  [id number] - [address] [place] [address] Phone: [phone] Fax: [phone]  Is this the correct pharmacy for this prescription? Yes If no, delete pharmacy and type the correct one.   Has the prescription been filled recently? No  Is the patient out of the medication? Yes  Has the patient been seen for an appointment in the last year OR does the patient have an upcoming appointment? Yes  Can we respond through MyChart? Yes  Agent: Please be advised that Rx refills may take up to 3 business days. We ask that you follow-up with your pharmacy.

## 2023-06-24 NOTE — Telephone Encounter (Signed)
 Patient states that she is losing her voice again.(I can hear) She states that what she sees in her mouth she does not like. She has thrush and sore throat. She tried to get in touch with ENT and she had no success. She feels overall terrible.

## 2023-06-24 NOTE — Telephone Encounter (Signed)
 I sent the rx and a mychart message.  Thanks.

## 2023-06-27 NOTE — Telephone Encounter (Signed)
 Please call patient.  I do not think she needs to lower her LDL more.  I would keep taking atorvastatin and continue work on diet and exercise.  Please get update about her oral symptoms/thrush.  See MyChart message.  Thanks.

## 2023-06-28 NOTE — Telephone Encounter (Signed)
 Duplicate message, patient responded on other MyChart message from same date, will contact us  if she has any questions.

## 2023-07-05 DIAGNOSIS — L639 Alopecia areata, unspecified: Secondary | ICD-10-CM | POA: Diagnosis not present

## 2023-07-06 DIAGNOSIS — J329 Chronic sinusitis, unspecified: Secondary | ICD-10-CM | POA: Diagnosis not present

## 2023-07-13 ENCOUNTER — Other Ambulatory Visit: Payer: Self-pay | Admitting: Family Medicine

## 2023-07-13 DIAGNOSIS — K589 Irritable bowel syndrome without diarrhea: Secondary | ICD-10-CM

## 2023-07-13 DIAGNOSIS — K582 Mixed irritable bowel syndrome: Secondary | ICD-10-CM

## 2023-07-18 ENCOUNTER — Telehealth: Payer: Self-pay

## 2023-07-18 NOTE — Telephone Encounter (Signed)
 Copied from CRM [id number]. Topic: Clinical - Medication Question >> Jul 18, 2023  1:51 PM Trula Gable C wrote: Reason for CRM: Patient called in stated she would like for Dr.Duncan to send over the antibiotic that he prescribed her last time , stated it is a compound

## 2023-07-18 NOTE — Telephone Encounter (Signed)
 Please get an update on her symptoms and the med she is referencing.  Please let me know. Thanks.

## 2023-07-19 ENCOUNTER — Telehealth: Payer: Self-pay

## 2023-07-19 NOTE — Telephone Encounter (Signed)
 Returning nurse call.  Advised CMA is at lunch and will give her a call back

## 2023-07-19 NOTE — Telephone Encounter (Signed)
 Left voicemail for patient to return call to office.

## 2023-07-19 NOTE — Telephone Encounter (Signed)
 I saw her resp culture results with pseudomonas---> that led to the tobramycin rx.    If she is still coughing in spite of that, I think she needs to be seen in clinic for consideration of CXR and clinical exam.  Please see about getting her scheduled when possible.  Thanks.

## 2023-07-19 NOTE — Telephone Encounter (Signed)
 Closing encounter the reason for the call has been addressed in another encounter.

## 2023-07-19 NOTE — Telephone Encounter (Signed)
 Spoke with patient and she is requesting an antibiotic for her cough that she has. She states that it hurts to cough. The cough causes her head to hurt, she may even accidentally urinate on herself. She is requesting something that can help  her.  In reference to the compound she was just wanting you to be aware that ENT prescribed her tobramycin just in case you prescribed her something that may interact with that medication.

## 2023-07-20 NOTE — Telephone Encounter (Signed)
  Returned call to patient and scheduled an office visit     Copied from CRM [id number]. Topic: Clinical - Medication Question >> Jul 18, 2023  1:51 PM Trula Gable C wrote: Reason for CRM: Patient called in stated she would like for Dr.Duncan to send over the antibiotic that he prescribed her last time , stated it is a compound >> Jul 19, 2023  1:36 PM Odilia Bennett D wrote: Patient called stating she is returning a call and she need something to help with cough and congestion. Patient stated It is bad at night like she is choking and she stated she need liquid medication at night. Patient want to know if she can get the OTC cough medicine or do she have to come in to get something prescribe to her. Patient stated it is not a new cough. Patient stated her rinse medication is called-tobramycin CB [phone]

## 2023-07-25 ENCOUNTER — Ambulatory Visit (INDEPENDENT_AMBULATORY_CARE_PROVIDER_SITE_OTHER): Admitting: Family Medicine

## 2023-07-25 ENCOUNTER — Encounter: Payer: Self-pay | Admitting: Family Medicine

## 2023-07-25 ENCOUNTER — Ambulatory Visit (INDEPENDENT_AMBULATORY_CARE_PROVIDER_SITE_OTHER)
Admission: RE | Admit: 2023-07-25 | Discharge: 2023-07-25 | Disposition: A | Discharge status: Home / Self Care | Source: Ambulatory Visit | Attending: Family Medicine | Admitting: Family Medicine

## 2023-07-25 VITALS — BP 118/60 | HR 64 | Temp 98.4°F | Ht 62.0 in | Wt 136.2 lb

## 2023-07-25 DIAGNOSIS — R059 Cough, unspecified: Secondary | ICD-10-CM | POA: Diagnosis not present

## 2023-07-25 NOTE — Progress Notes (Unsigned)
 Ongoing cough.    We talked about her allergy  list.  Per patient, the only med that caused a rash was levaquin.  Sulfa allergy  removed.  Discussed.  She agreed.  Has ENT f/u pending for tomorrow.    She noted darker but not black/tarry stools with nasal antibiotic rinse.  She had cx per ENT with Pseudomonas aeruginosa, d/w pt about prev cx data.  She is on tobramycin per ENT.  She is some better with less discolored rhinorrhea.    No wheeze.  No fevers.  No vomiting.  Some sputum, she feels like she is clearing her throat.  She prev had more rhinorrhea.   No inc in SOB.  Not SOB unless in a coughing fit or going up steps.  She is still trying to do her silver sneakers except for the last 2 weeks due to cough.    Meds, vitals, and allergies reviewed.   ROS: Per HPI unless specifically indicated in ROS section   Nad Ncat Neck supple, no LA Rrr Ctab Sinuses mildly ttp x4 No stridor.  No BLE edema.  Skin well-perfused.  30 minutes were devoted to patient care in this encounter (this includes time spent reviewing the patient's file/history, interviewing and examining the patient, counseling/reviewing plan with patient).

## 2023-07-25 NOTE — Patient Instructions (Addendum)
 Don't change your meds for now.  Keep using the antibiotic nasal rinse.  Keep the appointment with ENT.  CXR on the way out.  Your lungs still sound clear.  Take care.  Glad to see you.

## 2023-07-26 DIAGNOSIS — J320 Chronic maxillary sinusitis: Secondary | ICD-10-CM | POA: Diagnosis not present

## 2023-07-28 ENCOUNTER — Ambulatory Visit: Payer: Self-pay | Admitting: Family Medicine

## 2023-07-28 NOTE — Assessment & Plan Note (Signed)
 Lungs clear.  Okay for outpatient follow-up. Unclear if cough is related to sinusitis.  Discussed checking chest x-ray today and then following up with ENT.  Would continue with tobramycin nasal rinse in the meantime.

## 2023-08-02 ENCOUNTER — Other Ambulatory Visit: Payer: Self-pay | Admitting: Family Medicine

## 2023-08-02 ENCOUNTER — Ambulatory Visit (INDEPENDENT_AMBULATORY_CARE_PROVIDER_SITE_OTHER): Payer: Medicare Other

## 2023-08-02 DIAGNOSIS — I4430 Unspecified atrioventricular block: Secondary | ICD-10-CM

## 2023-08-02 LAB — CUP PACEART REMOTE DEVICE CHECK
Battery Remaining Longevity: 97 mo
Battery Remaining Percentage: 81 %
Battery Voltage: 3.01 V
Brady Statistic AP VP Percent: 1.3 %
Brady Statistic AP VS Percent: 43 %
Brady Statistic AS VP Percent: 6.2 %
Brady Statistic AS VS Percent: 50 %
Brady Statistic RA Percent Paced: 44 %
Brady Statistic RV Percent Paced: 7.5 %
Date Time Interrogation Session: 20250624020023
Implantable Lead Connection Status: 753985
Implantable Lead Connection Status: 753985
Implantable Lead Implant Date: 20230623
Implantable Lead Implant Date: 20230623
Implantable Lead Location: 753859
Implantable Lead Location: 753860
Implantable Pulse Generator Implant Date: 20230623
Lead Channel Impedance Value: 390 Ohm
Lead Channel Impedance Value: 430 Ohm
Lead Channel Pacing Threshold Amplitude: 0.5 V
Lead Channel Pacing Threshold Amplitude: 1.625 V
Lead Channel Pacing Threshold Pulse Width: 0.5 ms
Lead Channel Pacing Threshold Pulse Width: 0.5 ms
Lead Channel Sensing Intrinsic Amplitude: 10.9 mV
Lead Channel Sensing Intrinsic Amplitude: 5 mV
Lead Channel Setting Pacing Amplitude: 1.5 V
Lead Channel Setting Pacing Amplitude: 1.875
Lead Channel Setting Pacing Pulse Width: 0.5 ms
Lead Channel Setting Sensing Sensitivity: 2 mV
Pulse Gen Model: 2272
Pulse Gen Serial Number: 8092211

## 2023-08-02 NOTE — Telephone Encounter (Signed)
 LOV: 07/25/23 WNC:WNUYPWH SCHEDULED   LAST REFILL: ALENDRONATE  SODIUM 70 MG TAB 10/11/22 13 tablets 3 refills

## 2023-08-03 NOTE — Telephone Encounter (Signed)
 I sent 12 more weeks of med.  Plan is to finish alendronate  fall 2025. Likely recheck bone density test 2026.  Thanks.

## 2023-08-04 ENCOUNTER — Ambulatory Visit: Payer: Self-pay | Admitting: Cardiology

## 2023-08-16 DIAGNOSIS — J320 Chronic maxillary sinusitis: Secondary | ICD-10-CM | POA: Diagnosis not present

## 2023-08-16 DIAGNOSIS — Z9889 Other specified postprocedural states: Secondary | ICD-10-CM | POA: Diagnosis not present

## 2023-08-19 DIAGNOSIS — H35033 Hypertensive retinopathy, bilateral: Secondary | ICD-10-CM | POA: Diagnosis not present

## 2023-08-19 DIAGNOSIS — H01009 Unspecified blepharitis unspecified eye, unspecified eyelid: Secondary | ICD-10-CM | POA: Diagnosis not present

## 2023-08-19 DIAGNOSIS — H4010X4 Unspecified open-angle glaucoma, indeterminate stage: Secondary | ICD-10-CM | POA: Diagnosis not present

## 2023-08-19 DIAGNOSIS — H53413 Scotoma involving central area, bilateral: Secondary | ICD-10-CM | POA: Diagnosis not present

## 2023-08-26 ENCOUNTER — Other Ambulatory Visit: Payer: Self-pay | Admitting: Family Medicine

## 2023-09-06 DIAGNOSIS — J320 Chronic maxillary sinusitis: Secondary | ICD-10-CM | POA: Diagnosis not present

## 2023-09-06 DIAGNOSIS — J310 Chronic rhinitis: Secondary | ICD-10-CM | POA: Diagnosis not present

## 2023-09-29 ENCOUNTER — Other Ambulatory Visit: Payer: Self-pay | Admitting: Family Medicine

## 2023-10-04 DIAGNOSIS — L819 Disorder of pigmentation, unspecified: Secondary | ICD-10-CM | POA: Diagnosis not present

## 2023-10-04 DIAGNOSIS — L720 Epidermal cyst: Secondary | ICD-10-CM | POA: Diagnosis not present

## 2023-10-04 DIAGNOSIS — L639 Alopecia areata, unspecified: Secondary | ICD-10-CM | POA: Diagnosis not present

## 2023-10-11 ENCOUNTER — Other Ambulatory Visit: Payer: Self-pay | Admitting: Family Medicine

## 2023-10-24 ENCOUNTER — Ambulatory Visit: Payer: Self-pay

## 2023-10-24 NOTE — Telephone Encounter (Addendum)
 First attempt; no answer Second attempt, no answer. Third attempt; no answer Reason for Triage: cough is lingering

## 2023-11-01 ENCOUNTER — Ambulatory Visit (INDEPENDENT_AMBULATORY_CARE_PROVIDER_SITE_OTHER): Payer: Medicare Other

## 2023-11-01 VITALS — BP 104/64 | Ht 62.0 in | Wt 136.6 lb

## 2023-11-01 DIAGNOSIS — Z0000 Encounter for general adult medical examination without abnormal findings: Secondary | ICD-10-CM | POA: Diagnosis not present

## 2023-11-01 DIAGNOSIS — I4430 Unspecified atrioventricular block: Secondary | ICD-10-CM

## 2023-11-01 LAB — CUP PACEART REMOTE DEVICE CHECK
Battery Remaining Longevity: 94 mo
Battery Remaining Percentage: 79 %
Battery Voltage: 3.01 V
Brady Statistic AP VP Percent: 2.8 %
Brady Statistic AP VS Percent: 46 %
Brady Statistic AS VP Percent: 6.9 %
Brady Statistic AS VS Percent: 45 %
Brady Statistic RA Percent Paced: 48 %
Brady Statistic RV Percent Paced: 9.7 %
Date Time Interrogation Session: 20250923020019
Implantable Lead Connection Status: 753985
Implantable Lead Connection Status: 753985
Implantable Lead Implant Date: 20230623
Implantable Lead Implant Date: 20230623
Implantable Lead Location: 753859
Implantable Lead Location: 753860
Implantable Pulse Generator Implant Date: 20230623
Lead Channel Impedance Value: 410 Ohm
Lead Channel Impedance Value: 430 Ohm
Lead Channel Pacing Threshold Amplitude: 0.5 V
Lead Channel Pacing Threshold Amplitude: 1.625 V
Lead Channel Pacing Threshold Pulse Width: 0.5 ms
Lead Channel Pacing Threshold Pulse Width: 0.5 ms
Lead Channel Sensing Intrinsic Amplitude: 4.2 mV
Lead Channel Sensing Intrinsic Amplitude: 9.6 mV
Lead Channel Setting Pacing Amplitude: 1.5 V
Lead Channel Setting Pacing Amplitude: 1.875
Lead Channel Setting Pacing Pulse Width: 0.5 ms
Lead Channel Setting Sensing Sensitivity: 2 mV
Pulse Gen Model: 2272
Pulse Gen Serial Number: 8092211

## 2023-11-01 NOTE — Progress Notes (Addendum)
 Subjective:   [name] is a 78 y.o. who presents for a Medicare Wellness preventive visit.  As a reminder, Annual Wellness Visits don't include a physical exam, and some assessments may be limited, especially if this visit is performed virtually. We may recommend an in-person follow-up visit with your provider if needed.  Visit Complete: In person  Persons Participating in Visit: Patient.  AWV Questionnaire: Yes: Patient Medicare AWV questionnaire was completed by the patient on 10/28/23; I have confirmed that all information answered by patient is correct and no changes since this date.  Cardiac Risk Factors include: advanced age (>78men, >72 women);dyslipidemia;hypertension;sedentary lifestyle     Objective:    Today's Vitals   10/28/23 2212 11/01/23 1115  BP:  104/64  Weight:  136 lb 9.6 oz (62 kg)  Height:  5' 2 (1.575 m)  PainSc: 9     Body mass index is 24.98 kg/m.     11/01/2023   11:37 AM 10/21/2022   10:56 AM 07/31/2021    3:21 AM 07/31/2021    3:00 AM 10/28/2020   11:11 PM 09/04/2020   10:51 PM 04/01/2020   12:28 PM  Advanced Directives  Does Patient Have a Medical Advance Directive? Yes Yes  No No No   Type of Estate agent of [place];Living will Healthcare Power of [place];Living will       Copy of Healthcare Power of Attorney in Chart? No - copy requested No - copy requested       Would patient like information on creating a medical advance directive?   No - Patient declined  No - Patient declined No - Patient declined No - Patient declined    Current Medications (verified) Outpatient Encounter Medications as of 11/01/2023  Medication Sig   acetaminophen  (TYLENOL ) 650 MG CR tablet Take 650 mg by mouth every 8 (eight) hours as needed for pain.   albuterol  (VENTOLIN  HFA) 108 (90 Base) MCG/ACT inhaler Inhale 2 puffs into the lungs every 6 (six) hours as needed for wheezing or shortness of breath.   amLODipine  (NORVASC ) 10 MG tablet  TAKE 1 TABLET BY MOUTH EVERY DAY   atorvastatin (LIPITOR) 20 MG tablet TAKE 1 TABLET BY MOUTH EVERY DAY   Azelastine  HCl 137 MCG/SPRAY SOLN Place 2 sprays into both nostrils daily.   cetirizine  (ZYRTEC ) 10 MG tablet Take 10 mg by mouth daily as needed for allergies.   clotrimazole  (MYCELEX ) 10 MG troche TAKE 1 TABLET (10 MG TOTAL) BY MOUTH 5 (FIVE) TIMES DAILY. REPEAT COURSE IF NEEDED   desonide (DESOWEN) 0.05 % ointment Apply 1 Application topically as needed.   dicyclomine  (BENTYL ) 20 MG tablet TAKE 1 TABLET BY MOUTH THREE TIMES A DAY AS NEEDED   dorzolamide (TRUSOPT) 2 % ophthalmic solution SMARTSIG:In Eye(s)   FLUoxetine  (PROZAC ) 10 MG tablet TAKE 1 TABLET BY MOUTH EVERY DAY   GARLIC PO Take 1 tablet by mouth every other day.   guaiFENesin  (MUCINEX ) 600 MG 12 hr tablet Take 1 tablet (600 mg total) by mouth 2 (two) times daily as needed for to loosen phlegm or cough.   hydrocortisone 2.5 % cream Apply 1 Application topically 2 (two) times daily as needed (itching).   ipratropium (ATROVENT ) 0.06 % nasal spray Place 2 sprays into both nostrils daily.   latanoprost  (XALATAN ) 0.005 % ophthalmic solution Place 1 drop into both eyes at bedtime.   montelukast  (SINGULAIR ) 10 MG tablet TAKE 1 TABLET BY MOUTH DAILY AS DIRECTED   Multiple Vitamin (  MULTIVITAMIN) tablet Take 1 tablet by mouth daily.   pantoprazole  (PROTONIX ) 40 MG tablet Take 1 tablet (40 mg total) by mouth daily.   Tiotropium Bromide-Olodaterol (STIOLTO RESPIMAT ) 2.5-2.5 MCG/ACT AERS Inhale 2 puffs into the lungs daily.   tobramycin, PF, (TOBI) 300 MG/5ML nebulizer solution Take 300 mg by nebulization 2 (two) times daily.   valsartan  (DIOVAN ) 160 MG tablet TAKE 1 TABLET BY MOUTH 2 TIMES DAILY.   vitamin B-12 (CYANOCOBALAMIN ) 1000 MCG tablet Take 1,000 mcg by mouth daily.   vitamin C (ASCORBIC ACID ) 500 MG tablet Take 500 mg by mouth daily.   Zinc  50 MG CAPS Take 1 capsule by mouth daily.   alendronate  (FOSAMAX ) 70 MG tablet TAKE 1  TABLET ONCE A WEEK. TAKE WITH A FULL GLASS OF WATER ON AN EMPTY STOMACH.   No facility-administered encounter medications on file as of 11/01/2023.    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  08/01/2021   Symptomatic bradycardia 08/01/2021   Past Surgical History:  Procedure Laterality Date   CATARACT EXTRACTION Bilateral    NASAL SINUS SURGERY     several   PACEMAKER IMPLANT N/A 07/31/2021   Procedure: PACEMAKER IMPLANT;  Surgeon: Inocencio Soyla Lunger, MD;  Location: [facility];  Service: Cardiovascular;  Laterality: N/A;   toenail removal      left great toenail   TUBAL LIGATION     Family History  Problem Relation Age of Onset   Stroke Mother    Hypertension Mother    Diabetes Father    Breast cancer Sister    Hypertension Sister    Hypertension Brother    Diabetes Brother    Diabetes Maternal Grandmother    Kidney failure Son    Heart disease Son        heart transplant   Heart disease Son        open heart surgery at 51 months old   Colon cancer Neg Hx    Social History   Socioeconomic History   Marital status: Widowed    Spouse name: Not on file   Number of children: 2   Years of education: Not on file   Highest education level: Associate degree: occupational, Scientist, product/process development, or vocational program  Occupational History   Occupation: retired  Tobacco Use   Smoking status: Never    Passive exposure: Past   Smokeless tobacco: Never  Vaping Use   Vaping status: Never Used  Substance and Sexual Activity   Alcohol use: Not Currently   Drug use: No   Sexual activity: Not Currently  Other Topics  Concern   Not on file  Social History Narrative   Widowed 2022.     Lived in [place]  for 50 years.     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   Social Drivers of Corporate investment banker Strain: Low Risk  (10/28/2023)   Overall Financial Resource Strain (CARDIA)    Difficulty of Paying Living Expenses: Not hard at all  Food Insecurity: No Food Insecurity (10/28/2023)   Hunger Vital Sign    Worried About Programme researcher, broadcasting/film/video in  the Last Year: Never true    Ran Out of Food in the Last Year: Never true  Transportation Needs: No Transportation Needs (10/28/2023)   PRAPARE - Administrator, Civil Service (Medical): No    Lack of Transportation (Non-Medical): No  Physical Activity: Sufficiently Active (10/28/2023)   Exercise Vital Sign    Days of Exercise per Week: 3 days    Minutes of Exercise per Session: 50 min  Stress: No Stress Concern Present (10/28/2023)   [name] of Occupational Health - Occupational Stress Questionnaire    Feeling of Stress: Not at all  Social Connections: Moderately Integrated (10/28/2023)   Social Connection and Isolation Panel    Frequency of Communication with Friends and Family: More than three times a week    Frequency of Social Gatherings with Friends and Family: More than three times a week    Attends Religious Services: More than 4 times per year    Active Member of [employer] or Organizations: Yes    Attends Banker Meetings: More than 4 times per year    Marital Status: Widowed    Tobacco Counseling Counseling given: Not Answered    Clinical Intake:  Pre-visit preparation completed: Yes  Pain : 0-10 Pain Score: 9  Pain Type: Acute pain Pain Location: Throat Pain Descriptors / Indicators: Sore Pain Onset: In the past 7 days Pain Frequency: Intermittent Pain Relieving Factors: gargle with salt water and mouthwash  Pain Relieving Factors: gargle with salt water and mouthwash  BMI -  recorded: 24.98 Nutritional Status: BMI of 19-24  Normal Nutritional Risks: None Diabetes: No  No results found for: HGBA1C   How often do you need to have someone help you when you read instructions, pamphlets, or other written materials from your doctor or pharmacy?: 1 - Never  Interpreter Needed?: No  Comments: lives alone Information entered by :: B.Tyla Burgner,LPN   Activities of Daily Living     10/28/2023   10:12 PM  In your present state of health, do you have any difficulty performing the following activities:  Hearing? 0  Vision? 0  Difficulty concentrating or making decisions? 0  Walking or climbing stairs? 0  Dressing or bathing? 0  Doing errands, shopping? 0  Preparing Food and eating ? N  Using the Toilet? N  In the past six months, have you accidently leaked urine? N  Do you have problems with loss of bowel control? Y  Managing your Medications? N  Managing your Finances? N  Housekeeping or managing your Housekeeping? N    Patient Care Team: Cleatus Arlyss RAMAN, MD as PCP - General (Family Medicine) Kate Lonni CROME, MD as PCP - Cardiology (Cardiology) Inocencio Soyla Lunger, MD as PCP - Electrophysiology (Cardiology) Debarah Lorrene DEL., MD (Ophthalmology)  I have updated your Care Teams any recent Medical Services you may have received from other providers in the past year.     Assessment:   This is a routine wellness examination for [name].  Hearing/Vision screen Hearing Screening - Comments:: Patient denies any hearing difficulties.   Vision Screening - Comments:: Pt says their vision is good without glasses;readers only [employer]   Goals Addressed             This Visit's Progress    COMPLETED: Patient Stated   On track    Lost some weight and tone up.       Depression Screen     11/01/2023   11:35 AM 05/19/2023  8:16 AM 10/21/2022   10:53 AM 08/09/2022   12:16 PM 05/03/2022    3:32 PM 12/28/2021   11:16 AM  PHQ 2/9 Scores  PHQ  - 2 Score 0 0 0 2 0 0  PHQ- 9 Score  2  2 0 0    Fall Risk     10/28/2023   10:12 PM 05/31/2023    9:25 AM 05/19/2023    8:08 AM 10/21/2022   10:57 AM 08/09/2022   12:16 PM  Fall Risk   Falls in the past year? 0 0 0 0 0  Number falls in past yr:  0 0 0 0  Injury with Fall? 0 0 0 0 0  Risk for fall due to : No Fall Risks No Fall Risks No Fall Risks No Fall Risks No Fall Risks  Follow up Education provided;Falls prevention discussed Falls evaluation completed Falls evaluation completed Falls prevention discussed;Falls evaluation completed Falls evaluation completed    MEDICARE RISK AT HOME:  Medicare Risk at Home Any stairs in or around the home?: (Patient-Rptd) Yes If so, are there any without handrails?: (Patient-Rptd) No Home free of loose throw rugs in walkways, pet beds, electrical cords, etc?: (Patient-Rptd) Yes Adequate lighting in your home to reduce risk of falls?: (Patient-Rptd) Yes Life alert?: (Patient-Rptd) No Use of a cane, walker or w/c?: (Patient-Rptd) No Grab bars in the bathroom?: (Patient-Rptd) No Shower chair or bench in shower?: (Patient-Rptd) No Elevated toilet seat or a handicapped toilet?: (Patient-Rptd) No  TIMED UP AND GO:  Was the test performed?  Yes  Length of time to ambulate 10 feet: 10 sec Gait steady and fast without use of assistive device  Cognitive Function: 6CIT completed        11/01/2023   11:38 AM 10/21/2022   10:59 AM  6CIT Screen  What Year? 0 points 0 points  What month? 0 points 0 points  What time? 0 points 0 points  Count back from 20 0 points 0 points  Months in reverse 0 points 0 points  Repeat phrase 0 points 0 points  Total Score 0 points 0 points    Immunizations Immunization History  Administered Date(s) Administered   Fluad Quad(high Dose 65+) 12/28/2021   Fluad Trivalent(High Dose 65+) 10/22/2022   Influenza Split 12/23/2003, 11/21/2006, 10/20/2007, 10/21/2008   Moderna Covid-19 Fall Seasonal Vaccine 33yrs & older  11/28/2021   Moderna Covid-19 Vaccine Bivalent Booster 37yrs & up 01/19/2021   Moderna Sars-Covid-2 Vaccination 03/10/2019, 04/07/2019, 01/19/2021   Pfizer(Comirnaty)Fall Seasonal Vaccine 12 years and older 11/28/2021, 10/22/2022   Pneumococcal Conjugate-13 12/24/2015   Pneumococcal Polysaccharide-23 07/21/2015, 11/07/2015, 12/19/2018, 03/11/2022   Td (Adult),5 Lf Tetanus Toxid, Preservative Free 02/21/2004    Screening Tests Health Maintenance  Topic Date Due   Zoster Vaccines- Shingrix (1 of 2) Never done   DTaP/Tdap/Td (1 - Tdap) 02/22/2004   Influenza Vaccine  09/09/2023   COVID-19 Vaccine (7 - 2024-25 season) 10/10/2023   Medicare Annual Wellness (AWV)  10/31/2024   Pneumococcal Vaccine: 50+ Years  Completed   DEXA SCAN  Completed   HPV VACCINES  Aged Out   Meningococcal B Vaccine  Aged Out    Health Maintenance Items Addressed: None due at this time. Pt will receive vaccines at their pharmcy when decided to obtain (pt sick today;cannot receive Influenza  Additional Screening:  Vision Screening: Recommended annual ophthalmology exams for early detection of glaucoma and other disorders of the eye. Is the patient up to date with their annual  eye exam?  Yes  Who is the provider or what is the name of the office in which the patient attends annual eye exams? Dr Lorrene Devonshire  Dental Screening: Recommended annual dental exams for proper oral hygiene  Community Resource Referral / Chronic Care Management: CRR required this visit?  No   CCM required this visit?  Appt scheduled with PCP   Plan:    I have personally reviewed and noted the following in the patient's chart:   Medical and social history Use of alcohol, tobacco or illicit drugs  Current medications and supplements including opioid prescriptions. Patient is not currently taking opioid prescriptions. Functional ability and status Nutritional status Physical activity Advanced directives List of other  physicians Hospitalizations, surgeries, and ER visits in previous 12 months Vitals Screenings to include cognitive, depression, and falls Referrals and appointments  In addition, I have reviewed and discussed with patient certain preventive protocols, quality metrics, and best practice recommendations. A written personalized care plan for preventive services as well as general preventive health recommendations were provided to patient.   Erminio LITTIE Saris, LPN   [id number]   After Visit Summary: AVS printed and given to pt  Notes: Pt says has a very bad productive cough and sore throat she is very concerned about and desired to be seen today (thought was seeing md). I made with first avaible tomorrow (as pt could not do SD this afternoon-has conflict).

## 2023-11-01 NOTE — Patient Instructions (Signed)
 [name],  Thank you for taking the time for your Medicare Wellness Visit. I appreciate your continued commitment to your health goals. Please review the care plan we discussed, and feel free to reach out if I can assist you further.  Medicare recommends these wellness visits once per year to help you and your care team stay ahead of potential health issues. These visits are designed to focus on prevention, allowing your provider to concentrate on managing your acute and chronic conditions during your regular appointments.  Please note that Annual Wellness Visits do not include a physical exam. Some assessments may be limited, especially if the visit was conducted virtually. If needed, we may recommend a separate in-person follow-up with your provider.  Ongoing Care Seeing your primary care provider every 3 to 6 months helps us  monitor your health and provide consistent, personalized care.   Referrals If a referral was made during today's visit and you haven't received any updates within two weeks, please contact the referred provider directly to check on the status.  Recommended Screenings:  Health Maintenance  Topic Date Due   Zoster (Shingles) Vaccine (1 of 2) Never done   DTaP/Tdap/Td vaccine (1 - Tdap) 02/22/2004   Flu Shot  09/09/2023   COVID-19 Vaccine (7 - 2024-25 season) 10/10/2023   Medicare Annual Wellness Visit  10/31/2024   Pneumococcal Vaccine for age over 56  Completed   DEXA scan (bone density measurement)  Completed   HPV Vaccine  Aged Out   Meningitis B Vaccine  Aged Out       10/21/2022   10:56 AM  Advanced Directives  Does Patient Have a Medical Advance Directive? Yes  Type of Estate agent of [place];Living will  Copy of Healthcare Power of Attorney in Chart? No - copy requested   Advance Care Planning is important because it: Ensures you receive medical care that aligns with your values, goals, and preferences. Provides guidance to  your family and loved ones, reducing the emotional burden of decision-making during critical moments.  Vision: Annual vision screenings are recommended for early detection of glaucoma, cataracts, and diabetic retinopathy. These exams can also reveal signs of chronic conditions such as diabetes and high blood pressure.  Dental: Annual dental screenings help detect early signs of oral cancer, gum disease, and other conditions linked to overall health, including heart disease and diabetes.  Please see the attached documents for additional preventive care recommendations.

## 2023-11-02 ENCOUNTER — Ambulatory Visit (INDEPENDENT_AMBULATORY_CARE_PROVIDER_SITE_OTHER): Admitting: Family

## 2023-11-02 ENCOUNTER — Ambulatory Visit: Payer: Self-pay | Admitting: Cardiology

## 2023-11-02 VITALS — BP 128/68 | HR 107 | Temp 98.1°F | Ht 62.0 in | Wt 137.6 lb

## 2023-11-02 DIAGNOSIS — R300 Dysuria: Secondary | ICD-10-CM | POA: Diagnosis not present

## 2023-11-02 DIAGNOSIS — J029 Acute pharyngitis, unspecified: Secondary | ICD-10-CM

## 2023-11-02 DIAGNOSIS — J069 Acute upper respiratory infection, unspecified: Secondary | ICD-10-CM | POA: Diagnosis not present

## 2023-11-02 DIAGNOSIS — B9689 Other specified bacterial agents as the cause of diseases classified elsewhere: Secondary | ICD-10-CM

## 2023-11-02 LAB — POCT RAPID STREP A (OFFICE): Rapid Strep A Screen: NEGATIVE

## 2023-11-02 LAB — POC URINALSYSI DIPSTICK (AUTOMATED)
Bilirubin, UA: NEGATIVE
Blood, UA: NEGATIVE
Glucose, UA: NEGATIVE
Ketones, UA: NEGATIVE
Leukocytes, UA: NEGATIVE
Nitrite, UA: NEGATIVE
Protein, UA: NEGATIVE
Spec Grav, UA: <=1.005 — AB (ref 1.010–1.025)
Urobilinogen, UA: NEGATIVE U/dL — AB
pH, UA: 6 (ref 5.0–8.0)

## 2023-11-02 MED ORDER — AMOXICILLIN-POT CLAVULANATE 875-125 MG PO TABS: 1.0000 | ORAL_TABLET | Freq: Two times a day (BID) | ORAL | 0 refills | Status: DC

## 2023-11-02 NOTE — Progress Notes (Signed)
 Remote PPM Transmission

## 2023-11-02 NOTE — Addendum Note (Signed)
 Addended by: SEBASTIAN DANNA GRADE on: 11/02/2023 02:20 PM   Modules accepted: Orders

## 2023-11-02 NOTE — Progress Notes (Signed)
 Established Patient Office Visit  Subjective:      CC:  Chief Complaint  Patient presents with   Sore Throat    Started last Thursday 10/27/23   Cough    Started a couple weeks ago.  Productive cough, patient reports pale green mucus.     Dysuria    Started Friday has been drinking cranberry juice.      HPI: [name] is a 78 y.o. female presenting on 11/02/2023 for Sore Throat (Started last Thursday 10/27/23), Cough (Started a couple weeks ago.  Productive cough, patient reports pale green mucus.  ), and Dysuria (Started Friday has been drinking cranberry juice.  )   Discussed the use of AI scribe software for clinical note transcription with the patient, who gave verbal consent to proceed.  History of Present Illness [name] is an 78 year old female who presents with a sore throat and cough.  She has been experiencing a sore throat that worsens with swallowing and describes her cough as a 'deep bark,' which is embarrassing in public. Her symptoms vary with the seasons, and she has a history of sinus issues for which she has seen an ENT specialist. She has been using a compound antibiotic nasal rinse since June, with eight refills prescribed. She takes Zyrtec  for allergies and uses ipratropium nasal spray. She reports significant nasal drainage and sniffles. No ear pain but reports sinus pressure and congestion. She has no taste or smell, a condition that has persisted for years. She denies heartburn and ear pain but reports sinus pressure and congestion. She has been experiencing fatigue and increased tiredness with her current illness.  She reports a burning sensation during urination that began last Thursday and has since improved slightly. Initially, she experienced increased frequency of urination and mild lower abdominal discomfort. No fever, chills, or back pain. She self-treated with cranberry juice, both raw and cocktail, to alleviate symptoms.  She has a  history of thrush and has been using clotrimazole  troches since last Sunday, following a period of not using them. Her lips are dry and unable to retain lipstick, with a 'slick' appearance. She experiences thick, white saliva and uses mouthwash multiple times a day.  She has a pacemaker and is adjusting to a slower pace of life, noting that she used to be more active. She experiences shortness of breath when moving quickly, which she attributes to her new lifestyle post-pacemaker placement.  Wt Readings from Last 3 Encounters:  11/02/23 137 lb 9.6 oz (62.4 kg)  11/01/23 136 lb 9.6 oz (62 kg)  07/25/23 136 lb 3.2 oz (61.8 kg)          Social history:  Relevant past medical, surgical, family and social history reviewed and updated as indicated. Interim medical history since our last visit reviewed.  Allergies and medications reviewed and updated.  DATA REVIEWED: CHART IN EPIC     ROS: Negative unless specifically indicated above in HPI.    Current Outpatient Medications:    acetaminophen  (TYLENOL ) 650 MG CR tablet, Take 650 mg by mouth every 8 (eight) hours as needed for pain., Disp: , Rfl:    albuterol  (VENTOLIN  HFA) 108 (90 Base) MCG/ACT inhaler, Inhale 2 puffs into the lungs every 6 (six) hours as needed for wheezing or shortness of breath., Disp: 8 g, Rfl: 11   amLODipine  (NORVASC ) 10 MG tablet, TAKE 1 TABLET BY MOUTH EVERY DAY, Disp: 90 tablet, Rfl: 3   amoxicillin -clavulanate (AUGMENTIN ) 875-125 MG tablet,  Take 1 tablet by mouth 2 (two) times daily., Disp: 20 tablet, Rfl: 0   atorvastatin (LIPITOR) 20 MG tablet, TAKE 1 TABLET BY MOUTH EVERY DAY, Disp: 90 tablet, Rfl: 0   Azelastine  HCl 137 MCG/SPRAY SOLN, Place 2 sprays into both nostrils daily., Disp: 30 mL, Rfl: 12   cetirizine  (ZYRTEC ) 10 MG tablet, Take 10 mg by mouth daily as needed for allergies., Disp: , Rfl:    clotrimazole  (MYCELEX ) 10 MG troche, TAKE 1 TABLET (10 MG TOTAL) BY MOUTH 5 (FIVE) TIMES DAILY. REPEAT  COURSE IF NEEDED, Disp: 70 Troche, Rfl: 0   desonide (DESOWEN) 0.05 % ointment, Apply 1 Application topically as needed., Disp: , Rfl:    dicyclomine  (BENTYL ) 20 MG tablet, TAKE 1 TABLET BY MOUTH THREE TIMES A DAY AS NEEDED, Disp: 270 tablet, Rfl: 1   dorzolamide (TRUSOPT) 2 % ophthalmic solution, SMARTSIG:In Eye(s), Disp: , Rfl:    fexofenadine (ALLEGRA) 180 MG tablet, Take 180 mg by mouth daily., Disp: , Rfl:    FLUoxetine  (PROZAC ) 10 MG tablet, TAKE 1 TABLET BY MOUTH EVERY DAY, Disp: 90 tablet, Rfl: 3   GARLIC PO, Take 1 tablet by mouth every other day., Disp: , Rfl:    guaiFENesin  (MUCINEX ) 600 MG 12 hr tablet, Take 1 tablet (600 mg total) by mouth 2 (two) times daily as needed for to loosen phlegm or cough., Disp: 60 tablet, Rfl: 1   hydrocortisone 2.5 % cream, Apply 1 Application topically 2 (two) times daily as needed (itching)., Disp: , Rfl:    ipratropium (ATROVENT ) 0.06 % nasal spray, Place 2 sprays into both nostrils daily., Disp: 15 mL, Rfl: 12   latanoprost  (XALATAN ) 0.005 % ophthalmic solution, Place 1 drop into both eyes at bedtime., Disp: , Rfl:    montelukast  (SINGULAIR ) 10 MG tablet, TAKE 1 TABLET BY MOUTH DAILY AS DIRECTED, Disp: 90 tablet, Rfl: 1   Multiple Vitamin (MULTIVITAMIN) tablet, Take 1 tablet by mouth daily., Disp: , Rfl:    pantoprazole  (PROTONIX ) 40 MG tablet, Take 1 tablet (40 mg total) by mouth daily., Disp: 90 tablet, Rfl: 4   Tiotropium Bromide-Olodaterol (STIOLTO RESPIMAT ) 2.5-2.5 MCG/ACT AERS, Inhale 2 puffs into the lungs daily., Disp: 12 g, Rfl: 3   tobramycin, PF, (TOBI) 300 MG/5ML nebulizer solution, Take 300 mg by nebulization 2 (two) times daily., Disp: , Rfl:    valsartan  (DIOVAN ) 160 MG tablet, TAKE 1 TABLET BY MOUTH 2 TIMES DAILY., Disp: 180 tablet, Rfl: 2   vitamin B-12 (CYANOCOBALAMIN ) 1000 MCG tablet, Take 1,000 mcg by mouth daily., Disp: , Rfl:    vitamin C (ASCORBIC ACID ) 500 MG tablet, Take 500 mg by mouth daily., Disp: , Rfl:    Zinc  50 MG CAPS,  Take 1 capsule by mouth daily., Disp: , Rfl:         Objective:        BP 128/68   Pulse (!) 107   Temp 98.1 F (36.7 C) (Oral)   Ht 5' 2 (1.575 m)   Wt 137 lb 9.6 oz (62.4 kg)   SpO2 99%   BMI 25.17 kg/m   Physical Exam HEENT: Throat red with thick white saliva, possible thrush. Nasal drainage present, likely due to allergies.  Wt Readings from Last 3 Encounters:  11/02/23 137 lb 9.6 oz (62.4 kg)  11/01/23 136 lb 9.6 oz (62 kg)  07/25/23 136 lb 3.2 oz (61.8 kg)    Physical Exam Vitals reviewed.  Constitutional:      General: She is  not in acute distress.    Appearance: Normal appearance. She is normal weight. She is not ill-appearing, toxic-appearing or diaphoretic.  HENT:     Head: Normocephalic.     Right Ear: Tympanic membrane normal.     Left Ear: Tympanic membrane normal.     Nose: Nose normal.     Mouth/Throat:     Mouth: Mucous membranes are dry.     Pharynx: Posterior oropharyngeal erythema and postnasal drip present. No oropharyngeal exudate.     Comments: Erythema to soft and hard palate Thickened white foamy saliva present  Slight erythema tongue  Eyes:     Extraocular Movements: Extraocular movements intact.     Pupils: Pupils are equal, round, and reactive to light.  Cardiovascular:     Rate and Rhythm: Normal rate and regular rhythm.     Pulses: Normal pulses.     Heart sounds: Normal heart sounds.  Pulmonary:     Effort: Pulmonary effort is normal.     Breath sounds: Normal breath sounds.  Abdominal:     General: Abdomen is flat.     Tenderness: There is no abdominal tenderness. There is no right CVA tenderness or left CVA tenderness.  Musculoskeletal:     Cervical back: Normal range of motion.  Neurological:     General: No focal deficit present.     Mental Status: She is alert and oriented to person, place, and time. Mental status is at baseline.     Motor: No weakness.  Psychiatric:        Mood and Affect: Mood normal.         Behavior: Behavior normal.        Thought Content: Thought content normal.        Judgment: Judgment normal.          Results LABS   Urinalysis: No leukocytes, no bacteria   Strep test: Negative (11/02/2023)  Assessment & Plan:   Assessment and Plan Assessment & Plan Acute pharyngitis with oral candidiasis Presents with sore throat, thick white saliva, and history of oral candidiasis. Examination reveals very red throat, suggestive of pharyngitis. Strep test negative. Oral candidiasis likely contributing to symptoms. - Continue clotrimazole  troches. - Order throat culture. - Prescribe Augmentin  875 mg/125 mg, take one PO twice daily for ten days.  Allergic rhinitis Reports significant nasal drainage and sniffles, consistent with allergic rhinitis. Long-term use of Zyrtec  may have reduced efficacy. Allegra recommended as a safer alternative due to glaucoma. - Switch from Zyrtec  to Allegra for allergy  management.  Glaucoma Glaucoma present. Allegra considered a safer antihistamine option.  Pacemaker placement Has a pacemaker and is adjusting to lifestyle changes, including reduced activity levels.  Acute cystitis, resolved Experienced burning during urination, which has subsided. Urinalysis shows no signs of infection, indicating resolution. - Increase water intake to maintain hydration.  Recording duration: 12 minutes      Return for f/u PCP if no improvement in symptoms.     Ginger Patrick, MSN, APRN, FNP-C [facility]

## 2023-11-03 ENCOUNTER — Other Ambulatory Visit

## 2023-11-03 DIAGNOSIS — J029 Acute pharyngitis, unspecified: Secondary | ICD-10-CM

## 2023-11-05 LAB — CULTURE, GROUP A STREP
Micro Number: 17017019
SPECIMEN QUALITY:: ADEQUATE

## 2023-11-06 ENCOUNTER — Ambulatory Visit: Payer: Self-pay | Admitting: Family

## 2023-11-17 DIAGNOSIS — L639 Alopecia areata, unspecified: Secondary | ICD-10-CM | POA: Diagnosis not present

## 2023-11-17 DIAGNOSIS — L649 Androgenic alopecia, unspecified: Secondary | ICD-10-CM | POA: Diagnosis not present

## 2023-11-22 DIAGNOSIS — J320 Chronic maxillary sinusitis: Secondary | ICD-10-CM | POA: Diagnosis not present

## 2023-11-25 ENCOUNTER — Other Ambulatory Visit: Payer: Self-pay | Admitting: Family Medicine

## 2023-11-25 DIAGNOSIS — B370 Candidal stomatitis: Secondary | ICD-10-CM

## 2023-11-25 NOTE — Telephone Encounter (Signed)
 Clotrimazole  Last filled:  09/29/23, #70 Last OV:  07/25/23, acute cough; prior OV- CPE Next OV:  none

## 2023-12-09 ENCOUNTER — Ambulatory Visit: Payer: Self-pay | Admitting: *Deleted

## 2023-12-09 NOTE — Telephone Encounter (Signed)
 I spoke with pt; pt went for routine eye exam earlier this morning and pt was told she had bleeding in lt eye. Lt eye does not appear red or hurt. BP at eye dr was 149/91 at 11 AM. Pt went to [facility] on [address] and upon discharge BP was 126/72 P?. Pt said UC advised her to get BP monitor which pt did and pt will be checking BP morning, noon and night over weekend and will bring BP log when pt comes for appt already scheduled with Dr Watt on 12/12/23 at 2:20. With UC & ED precautions and pt voiced understanding. Pt said she plans to rest, and drink plenty of water this weekend per UC instructions.sending note to Dr Watt who is out of office today and Dr Avelina who is in office today and [place] [facility]. I spoke with Jon at [facility] and she said to fax medicval elease form for 12/09/23 vist at [facility]. Done. Fax to come to fax machine at [employer].

## 2023-12-09 NOTE — Telephone Encounter (Signed)
 FYI Only or Action Required?: Action required by provider: request for appointment, update on patient condition, and requesting earlier appt with PCP and please advise if patient needs to go to ED instead of UC.  Patient was last seen in primary care on 11/02/2023 by [name], FNP.  Called Nurse Triage reporting Hypertension.  Symptoms began today.  Interventions attempted: Nothing.  Symptoms are: gradually worsening.  Triage Disposition:  UC with in 4 hours and OV 1-3 days   Patient/caregiver understands and will follow disposition?: Yes   Please recommend if patient needs to go to ED instead of UC due to report from eye dr. Patient reports bleeding noted in eye and not visible in eye ball. Appt scheduled with other provider earliest Monday per patient request for BP medication checks Patient does not want to go to ED due to immunocompromised hx .             Copied from CRM #[id number]. Topic: Clinical - Red Word Triage >> Dec 09, 2023 11:21 AM [name] wrote: Red Word that prompted transfer to Nurse Triage: BP is elevated 149/91 , left eye bleeding. Reason for Disposition  [1] Systolic BP >= 130 OR Diastolic >= 80 AND [2] taking BP medications  Answer Assessment - Initial Assessment Questions Called patient and recommended to go to UC close to her eye dr to recheck BP and evaluate for bleeding in eye. Patient does not want to go to ED due to she is immunocompromised .  Patient reports she is sitting in parking lot to get her glasses and will go to UC for evaluation that is located close to eye dr office. Appt scheduled for 12/12/23 with other provider to f/u per request. None available with PCP until Nov 10. Patient reports she went to eye dr today for regular exam and dr noted bleeding in eye. Patient denies pupil redness or bleeding but dr reports bleeding in eye and  it is BP related. Recommended UC / ED if visible blood in eye.  Patient denies all other sx . Last BP in  eye dr BP 149/91. Patient has not missed doses of medication . Please advise if UC today and OV ok Monday  or if patient needs to got to ED now.       1. BLOOD PRESSURE: What is your blood pressure? Did you take at least two measurements 5 minutes apart?     BP 149/91  2. ONSET: When did you take your blood pressure?     Today at eye dr  3. HOW: How did you take your blood pressure? (e.g., automatic home BP monitor, visiting nurse)     Nurse at eye dr 4. HISTORY: Do you have a history of high blood pressure?     Yes  5. MEDICINES: Are you taking any medicines for blood pressure? Have you missed any doses recently?     Taking medication has not missed any doses 6. OTHER SYMPTOMS: Do you have any symptoms? (e.g., blurred vision, chest pain, difficulty breathing, headache, weakness)     No pain or no blurred vision now . No headache, no weakness. No chest pain no difficulty breathing . 7. PREGNANCY: Is there any chance you are pregnant? When was your last menstrual period?     na  Protocols used: Blood Pressure - High-A-AH

## 2023-12-12 ENCOUNTER — Encounter: Payer: Self-pay | Admitting: Radiology

## 2023-12-12 ENCOUNTER — Encounter: Payer: Self-pay | Admitting: Family Medicine

## 2023-12-12 ENCOUNTER — Ambulatory Visit: Admitting: Family Medicine

## 2023-12-12 VITALS — BP 122/64 | HR 67 | Temp 97.8°F | Ht 62.0 in | Wt 140.0 lb

## 2023-12-12 DIAGNOSIS — I10 Essential (primary) hypertension: Secondary | ICD-10-CM | POA: Diagnosis not present

## 2023-12-12 DIAGNOSIS — J324 Chronic pansinusitis: Secondary | ICD-10-CM

## 2023-12-12 NOTE — Progress Notes (Signed)
 Charlotta Lapaglia T. Adelynne Joerger, MD, CAQ Sports Medicine [facility] at [facility] [address] [place], [address]  Phone: [phone]  FAX: [phone]  [name] - 78 y.o. female  MRN [id number]  Date of Birth: Nov 28, 1945  Date: 12/12/2023  PCP: Cleatus Arlyss RAMAN, MD  Referral: Cleatus Arlyss RAMAN, MD  Chief Complaint  Patient presents with   Medical Management of Chronic Issues    C/o higher than usual BP readings. Wants to discuss meds.    Eye Problem    C/o L eye bleeding, per ophthalmologist at recent OV.    Subjective:   [name] is a 78 y.o. very pleasant female patient with Body mass index is 25.61 kg/m. who presents with the following:  Discussed the use of AI scribe software for clinical note transcription with the patient, who gave verbal consent to proceed.  Patient is here for concerns of elevated blood pressure.  She is currently on amlodipine  10 mg, Diovan  160 mg. Most recent GFR was 81.  130/60 on my check -   History of Present Illness [name] is an 78 year old female with a cardiac pacemaker who presents with blood pressure issues and sinus problems.  She experiences variable blood pressure readings, often higher at night, with some readings reaching 160/80. She takes amlodipine  in the morning and valsartan  160 mg twice daily. A recent reading with a new arm blood pressure monitor was 130/60.  She has a history of a [employer] dual chamber pacemaker and no history of heart attack, stroke, or diabetes. She experiences chronic sinusitis, asthma, and allergies, which have been particularly severe since spring, causing significant drainage and occasional wheezing. She uses a saline rinse, Stiolto inhaler, and has a rescue inhaler, though she has not needed the rescue inhaler recently. She experiences a persistent cough, especially in the afternoons, and has been using Delsym for relief.  She has a history of irritable  bowel syndrome and reports occasional flare-ups. Her diet includes oatmeal, vegetables, and baked chicken, and she has recently gained weight due to a 'sweet kick'.  She has experienced significant stress due to personal losses, including the unexpected death of her husband three years ago and the loss of her oldest son to kidney failure. Her youngest son is currently in prison, which also contributes to her stress. 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  She engages in physical activity by attending [employer] exercise classes three times a week, although she had to stop temporarily due to a severe sinus attack. She drives but limits her driving to two-hour trips due to health concerns.  She has a history of immune deficiency concerns. She has seen an allergy  specialist and had an autoimmune workup, which was normal. She is involved in church activities.   Review of Systems is noted in the HPI, as appropriate  Objective:   BP 122/64   Pulse 67   Temp 97.8 F (36.6 C) (Temporal)   Ht 5' 2 (1.575 m)   Wt 140 lb (63.5 kg)   SpO2 97%   BMI 25.61 kg/m   GEN: No acute distress; alert,appropriate. CV: RRR, no m/g/r  PULM: Breathing comfortably in no respiratory distress PSYCH: Normally interactive.   Laboratory and Imaging Data:  Assessment and Plan:     ICD-10-CM   1. Essential hypertension  I10      Assessment & Plan Essential hypertension Blood pressure variable,  elevated at night. Current regimen includes amlodipine  and valsartan . Stress and life events may contribute. No medication changes to avoid fall risk. - She is normotensive today - Monitor blood pressure twice daily for two weeks and record readings. - Submit recorded blood pressure readings via MyChart for review, will send to PCP. - Consider medication adjustments after reviewing blood pressure data.  Chronic sinusitis and asthma Chronic sinusitis with drainage  and asthma managed with Stiolto and rescue inhaler. Recent exacerbation noted. Avoid decongestants due to pacemaker and blood pressure. - Continue current management with Stiolto and rescue inhaler. - Avoid decongestants due to pacemaker and blood pressure concerns.   Irritable bowel syndrome Irritable bowel syndrome with occasional flare-ups. Dietary modifications in place. - Continue dietary modifications to manage symptoms.  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  Medication Management during today's office visit: No orders of the defined types were placed in this encounter.  Medications Discontinued During This Encounter  Medication Reason   amoxicillin -clavulanate (AUGMENTIN ) 875-125 MG tablet    cetirizine  (ZYRTEC ) 10 MG tablet Change in therapy   tobramycin, PF, (TOBI) 300 MG/5ML nebulizer solution Prescription never filled    Orders placed today for conditions managed today: No orders of the defined types were placed in this encounter.   Disposition: No follow-ups on file.  [employer] One speech-to-text software was used for transcription in this dictation.  Possible transcriptional errors can occur using Animal nutritionist.   Signed,  Jacques DASEN. Filip Luten, MD   Outpatient Encounter Medications as of 12/12/2023  Medication Sig   acetaminophen  (TYLENOL ) 650 MG CR tablet Take 650 mg by mouth every 8 (eight) hours as needed for pain.   albuterol  (VENTOLIN  HFA) 108 (90 Base) MCG/ACT inhaler Inhale 2 puffs into the lungs every 6 (six) hours as needed for wheezing or shortness of breath.   amLODipine  (NORVASC ) 10 MG tablet TAKE 1 TABLET BY MOUTH EVERY DAY   atorvastatin (LIPITOR) 20 MG tablet TAKE 1 TABLET BY MOUTH EVERY DAY   Azelastine  HCl 137 MCG/SPRAY SOLN Place 2 sprays into both nostrils daily.   clotrimazole  (MYCELEX ) 10 MG troche TAKE 1 TABLET (10 MG TOTAL) BY MOUTH 5 (FIVE) TIMES DAILY. REPEAT COURSE IF NEEDED   desonide  (DESOWEN) 0.05 % ointment Apply 1 Application topically as needed.   dicyclomine  (BENTYL ) 20 MG tablet TAKE 1 TABLET BY MOUTH THREE TIMES A DAY AS NEEDED   dorzolamide (TRUSOPT) 2 % ophthalmic solution SMARTSIG:In Eye(s)   fexofenadine (ALLEGRA) 180 MG tablet Take 180 mg by mouth daily.   FLUoxetine  (PROZAC ) 10 MG tablet TAKE 1 TABLET BY MOUTH EVERY DAY   GARLIC PO Take 1 tablet by mouth every other day.   guaiFENesin  (MUCINEX ) 600 MG 12 hr tablet Take 1 tablet (600 mg total) by mouth 2 (two) times daily as needed for to loosen phlegm or cough.   hydrocortisone 2.5 % cream Apply 1 Application topically 2 (two) times daily as needed (itching).   ipratropium (ATROVENT ) 0.06 % nasal spray Place 2 sprays into both nostrils daily.   latanoprost  (XALATAN ) 0.005 % ophthalmic solution Place 1 drop into both eyes at bedtime.   montelukast  (SINGULAIR ) 10 MG tablet TAKE 1 TABLET BY MOUTH DAILY AS DIRECTED   Multiple Vitamin (MULTIVITAMIN) tablet Take 1 tablet by mouth daily.   pantoprazole  (PROTONIX ) 40 MG tablet Take 1 tablet (40 mg total) by mouth daily.   Tiotropium Bromide-Olodaterol (STIOLTO RESPIMAT ) 2.5-2.5 MCG/ACT AERS Inhale 2 puffs into the lungs  daily.   valsartan  (DIOVAN ) 160 MG tablet TAKE 1 TABLET BY MOUTH 2 TIMES DAILY.   vitamin B-12 (CYANOCOBALAMIN ) 1000 MCG tablet Take 1,000 mcg by mouth daily.   vitamin C (ASCORBIC ACID ) 500 MG tablet Take 500 mg by mouth daily.   Zinc  50 MG CAPS Take 1 capsule by mouth daily.   [DISCONTINUED] amoxicillin -clavulanate (AUGMENTIN ) 875-125 MG tablet Take 1 tablet by mouth 2 (two) times daily.   [DISCONTINUED] cetirizine  (ZYRTEC ) 10 MG tablet Take 10 mg by mouth daily as needed for allergies.   [DISCONTINUED] tobramycin, PF, (TOBI) 300 MG/5ML nebulizer solution Take 300 mg by nebulization 2 (two) times daily.   No facility-administered encounter medications on file as of 12/12/2023.

## 2023-12-12 NOTE — Patient Instructions (Signed)
 Write your blood pressure down twice a day every day for the next 2 weeks and send those numbers in to Dr. Cleatus.

## 2023-12-14 ENCOUNTER — Emergency Department (HOSPITAL_COMMUNITY)
Admission: EM | Admit: 2023-12-14 | Discharge: 2023-12-14 | Disposition: A | Discharge status: Home / Self Care | Attending: Emergency Medicine | Admitting: Emergency Medicine

## 2023-12-14 ENCOUNTER — Ambulatory Visit: Payer: Self-pay

## 2023-12-14 DIAGNOSIS — R42 Dizziness and giddiness: Secondary | ICD-10-CM | POA: Diagnosis present

## 2023-12-14 DIAGNOSIS — E878 Other disorders of electrolyte and fluid balance, not elsewhere classified: Secondary | ICD-10-CM | POA: Diagnosis not present

## 2023-12-14 DIAGNOSIS — E871 Hypo-osmolality and hyponatremia: Secondary | ICD-10-CM | POA: Insufficient documentation

## 2023-12-14 LAB — CBC WITH DIFFERENTIAL/PLATELET
Abs Immature Granulocytes: 0.02 K/uL (ref 0.00–0.07)
Basophils Absolute: 0 K/uL (ref 0.0–0.1)
Basophils Relative: 1 %
Eosinophils Absolute: 0.1 K/uL (ref 0.0–0.5)
Eosinophils Relative: 2 %
HCT: 34.8 % — ABNORMAL LOW (ref 36.0–46.0)
Hemoglobin: 11.5 g/dL — ABNORMAL LOW (ref 12.0–15.0)
Immature Granulocytes: 0 %
Lymphocytes Relative: 21 %
Lymphs Abs: 1.7 K/uL (ref 0.7–4.0)
MCH: 31.3 pg (ref 26.0–34.0)
MCHC: 33 g/dL (ref 30.0–36.0)
MCV: 94.8 fL (ref 80.0–100.0)
Monocytes Absolute: 0.6 K/uL (ref 0.1–1.0)
Monocytes Relative: 8 %
Neutro Abs: 5.7 K/uL (ref 1.7–7.7)
Neutrophils Relative %: 68 %
Platelets: 218 K/uL (ref 150–400)
RBC: 3.67 MIL/uL — ABNORMAL LOW (ref 3.87–5.11)
RDW: 13.9 % (ref 11.5–15.5)
WBC: 8.2 K/uL (ref 4.0–10.5)
nRBC: 0 % (ref 0.0–0.2)

## 2023-12-14 LAB — COMPREHENSIVE METABOLIC PANEL WITH GFR
ALT: 19 U/L (ref 0–44)
AST: 25 U/L (ref 15–41)
Albumin: 4.2 g/dL (ref 3.5–5.0)
Alkaline Phosphatase: 75 U/L (ref 38–126)
Anion gap: 9 (ref 5–15)
BUN: 11 mg/dL (ref 8–23)
CO2: 24 mmol/L (ref 22–32)
Calcium: 9.6 mg/dL (ref 8.9–10.3)
Chloride: 96 mmol/L — ABNORMAL LOW (ref 98–111)
Creatinine, Ser: 0.65 mg/dL (ref 0.44–1.00)
GFR, Estimated: >60 mL/min (ref >60)
Glucose, Bld: 99 mg/dL (ref 70–99)
Potassium: 3.7 mmol/L (ref 3.5–5.1)
Sodium: 128 mmol/L — ABNORMAL LOW (ref 135–145)
Total Bilirubin: 0.4 mg/dL (ref 0.0–1.2)
Total Protein: 7.8 g/dL (ref 6.5–8.1)

## 2023-12-14 LAB — URINALYSIS, ROUTINE W REFLEX MICROSCOPIC
Bilirubin Urine: NEGATIVE
Glucose, UA: NEGATIVE mg/dL
Hgb urine dipstick: NEGATIVE
Ketones, ur: NEGATIVE mg/dL
Leukocytes,Ua: NEGATIVE
Nitrite: NEGATIVE
Protein, ur: NEGATIVE mg/dL
Specific Gravity, Urine: 1.002 — ABNORMAL LOW (ref 1.005–1.030)
pH: 7 (ref 5.0–8.0)

## 2023-12-14 MED ORDER — SODIUM CHLORIDE 0.9 % IV BOLUS
1000.0000 mL | Freq: Once | INTRAVENOUS | Status: AC
  Administered 2023-12-14: 1000 mL via INTRAVENOUS

## 2023-12-14 NOTE — ED Provider Triage Note (Signed)
 Emergency Medicine Provider Triage Evaluation Note  [name] , a 78 y.o. female  was evaluated in triage.  Pt complains of episode of light-headedness and wooziness earlier today. Patient came from UC due to elevated BP. Denies chest pain, fever, chills, recent illness.  Review of Systems  Positive: lightheadedness Negative: Fever, chills, chest pain, shortness of breath  Physical Exam  BP (!) 149/67   Pulse 63   Temp 98.5 F (36.9 C) (Oral)   Resp 18   SpO2 99%  Gen:   Awake, no distress   Resp:  Normal effort  MSK:   Moves extremities without difficulty  Other:    Medical Decision Making  Medically screening exam initiated at 3:23 PM.  Appropriate orders placed.  DESHON KOSLOWSKI was informed that the remainder of the evaluation will be completed by another provider, this initial triage assessment does not replace that evaluation, and the importance of remaining in the ED until their evaluation is complete.  Orders: CBC, CMP, UA, EKG   Janetta Terrall FALCON, PA-C 12/14/23 1529

## 2023-12-14 NOTE — Telephone Encounter (Signed)
 FYI Only or Action Required?: FYI only for provider: patient states she is going to Urgent Care.  Patient was last seen in primary care on 12/12/2023 by Watt Mirza, MD.  Called Nurse Triage reporting Hypertension.  Symptoms began today.  Interventions attempted: Prescription medications: Amlodipine  and Valsartan  and Rest, hydration, or home remedies.  Symptoms are: unchanged.  Triage Disposition: Go to ED Now (Notify PCP)  Patient/caregiver understands and will follow disposition?: Yes  Copied from CRM [id number]. Topic: Clinical - Red Word Triage >> Dec 14, 2023  1:14 PM Harlene ORN wrote: Red Word that prompted transfer to Nurse Triage: Patient  is concerned about her blood pressure levels. Her latest reading was 172 over 77 with a pulse of 79, and this is after taking her medications. Says she's just not feeling like herself, a funny feeling all over her body Reason for Disposition  [1] Systolic BP >= 160 OR Diastolic >= 100 AND [2] cardiac (e.g., breathing difficulty, chest pain) or neurologic symptoms (e.g., new-onset blurred or double vision, unsteady gait)  Answer Assessment - Initial Assessment Questions Reports elevated blood pressure with weakness. Patient states she is going to get checked out at Urgent Care.   1. BLOOD PRESSURE: What is your blood pressure? Did you take at least two measurements 5 minutes apart?     172/77 2. ONSET: When did you take your blood pressure?     today 3. HOW: How did you take your blood pressure? (e.g., automatic home BP monitor, visiting nurse)     Automatic home BP monitor 4. HISTORY: Do you have a history of high blood pressure?     yes 5. MEDICINES: Are you taking any medicines for blood pressure? Have you missed any doses recently?     Yes-no missed doses 6. OTHER SYMPTOMS: Do you have any symptoms? (e.g., blurred vision, chest pain, difficulty breathing, headache, weakness)     weakness  Protocols used: Blood  Pressure - High-A-AH

## 2023-12-14 NOTE — ED Provider Notes (Signed)
 [facility]ERG[facility] DEPARTMENT AT [facility] Provider Note   CSN: [id number] Arrival date & time: 12/14/23  1502     Patient presents with: No chief complaint on file.   [name] is a 78 y.o. female.   78 yo F with a chief complaints of high blood pressure.  Patient has been checking this routinely since she saw the ophthalmologist to told her that she had within slight cotton-wool spots.  She has seen her family doctor for this.  They are planning to log her blood pressures and recheck her in a couple weeks.  She had an episode today where she felt a little bit lightheaded.  That seems to have resolved.  She has chronic headaches and neck pain that she does not think is changed.  She denies chest pain or difficulty breathing.  Denies cough or fever.  Denies abdominal pain.  She has been trying to avoid salt entirely.  Has been drinking a lot of water to try and hydrate herself.        Prior to Admission medications   Medication Sig Start Date End Date Taking? Authorizing Provider  acetaminophen  (TYLENOL ) 650 MG CR tablet Take 650 mg by mouth every 8 (eight) hours as needed for pain.    [provider]  albuterol  (VENTOLIN  HFA) 108 (90 Base) MCG/ACT inhaler Inhale 2 puffs into the lungs every 6 (six) hours as needed for wheezing or shortness of breath. 05/31/22   Gladis Leonor HERO, MD  amLODipine  (NORVASC ) 10 MG tablet TAKE 1 TABLET BY MOUTH EVERY DAY 05/16/23   Cleatus Arlyss RAMAN, MD  atorvastatin (LIPITOR) 20 MG tablet TAKE 1 TABLET BY MOUTH EVERY DAY 08/02/23   Cleatus Arlyss RAMAN, MD  Azelastine  HCl 137 MCG/SPRAY SOLN Place 2 sprays into both nostrils daily. 05/31/23   Cleatus Arlyss RAMAN, MD  clotrimazole  (MYCELEX ) 10 MG troche TAKE 1 TABLET (10 MG TOTAL) BY MOUTH 5 (FIVE) TIMES DAILY. REPEAT COURSE IF NEEDED 11/27/23   Cleatus Arlyss RAMAN, MD  desonide (DESOWEN) 0.05 % ointment Apply 1 Application topically as needed. 03/08/23   [provider]   dicyclomine  (BENTYL ) 20 MG tablet TAKE 1 TABLET BY MOUTH THREE TIMES A DAY AS NEEDED 07/13/23   Cleatus Arlyss RAMAN, MD  dorzolamide (TRUSOPT) 2 % ophthalmic solution SMARTSIG:In Eye(s) 12/11/21   [provider]  fexofenadine (ALLEGRA) 180 MG tablet Take 180 mg by mouth daily.    [provider]  FLUoxetine  (PROZAC ) 10 MG tablet TAKE 1 TABLET BY MOUTH EVERY DAY 08/26/23   Cleatus Arlyss RAMAN, MD  GARLIC PO Take 1 tablet by mouth every other day.    [provider]  guaiFENesin  (MUCINEX ) 600 MG 12 hr tablet Take 1 tablet (600 mg total) by mouth 2 (two) times daily as needed for to loosen phlegm or cough. 09/15/22   Hope Almarie ORN, NP  hydrocortisone 2.5 % cream Apply 1 Application topically 2 (two) times daily as needed (itching). 06/22/21   [provider]  ipratropium (ATROVENT ) 0.06 % nasal spray Place 2 sprays into both nostrils daily. 05/31/23   Cleatus Arlyss RAMAN, MD  latanoprost  (XALATAN ) 0.005 % ophthalmic solution Place 1 drop into both eyes at bedtime. 04/06/21   [provider]  montelukast  (SINGULAIR ) 10 MG tablet TAKE 1 TABLET BY MOUTH DAILY AS DIRECTED 08/02/23   Cleatus Arlyss RAMAN, MD  Multiple Vitamin (MULTIVITAMIN) tablet Take 1 tablet by mouth daily.    [provider]  pantoprazole  (PROTONIX )  40 MG tablet Take 1 tablet (40 mg total) by mouth daily. 05/31/23   Cleatus Arlyss RAMAN, MD  Tiotropium Bromide-Olodaterol (STIOLTO RESPIMAT ) 2.5-2.5 MCG/ACT AERS Inhale 2 puffs into the lungs daily. 05/31/23   Cleatus Arlyss RAMAN, MD  valsartan  (DIOVAN ) 160 MG tablet TAKE 1 TABLET BY MOUTH 2 TIMES DAILY. 04/11/23   Cleatus Arlyss RAMAN, MD  vitamin B-12 (CYANOCOBALAMIN ) 1000 MCG tablet Take 1,000 mcg by mouth daily.    [provider]  vitamin C (ASCORBIC ACID ) 500 MG tablet Take 500 mg by mouth daily.    [provider]  Zinc  50 MG CAPS Take 1 capsule by mouth daily. 08/15/20   [provider]    Allergies: Azithromycin,  Hydrochlorothiazide, and Levofloxacin    Review of Systems  Updated Vital Signs BP (!) 145/68 (BP Location: Left Arm)   Pulse 61   Temp 98 F (36.7 C) (Oral)   Resp (!) 22   SpO2 97%   Physical Exam Vitals and nursing note reviewed.  Constitutional:      General: She is not in acute distress.    Appearance: She is well-developed. She is not diaphoretic.  HENT:     Head: Normocephalic and atraumatic.  Eyes:     Pupils: Pupils are equal, round, and reactive to light.  Cardiovascular:     Rate and Rhythm: Normal rate and regular rhythm.     Heart sounds: No murmur heard.    No friction rub. No gallop.  Pulmonary:     Effort: Pulmonary effort is normal.     Breath sounds: No wheezing or rales.  Abdominal:     General: There is no distension.     Palpations: Abdomen is soft.     Tenderness: There is no abdominal tenderness.  Musculoskeletal:        General: No tenderness.     Cervical back: Normal range of motion and neck supple.  Skin:    General: Skin is warm and dry.  Neurological:     Mental Status: She is alert and oriented to person, place, and time.     GCS: GCS eye subscore is 4. GCS verbal subscore is 5. GCS motor subscore is 6.     Cranial Nerves: Cranial nerves 2-12 are intact.     Sensory: Sensation is intact.     Motor: Motor function is intact.     Coordination: Coordination is intact.     Comments: Benign neurologic exam.  Ambulates without issue.  Psychiatric:        Behavior: Behavior normal.     (all labs ordered are listed, but only abnormal results are displayed) Labs Reviewed  CBC WITH DIFFERENTIAL/PLATELET - Abnormal; Notable for the following components:      Result Value   RBC 3.67 (*)    Hemoglobin 11.5 (*)    HCT 34.8 (*)    All other components within normal limits  COMPREHENSIVE METABOLIC PANEL WITH GFR - Abnormal; Notable for the following components:   Sodium 128 (*)    Chloride 96 (*)    All other components within normal limits   URINALYSIS, ROUTINE W REFLEX MICROSCOPIC - Abnormal; Notable for the following components:   Color, Urine COLORLESS (*)    Specific Gravity, Urine 1.002 (*)    All other components within normal limits    EKG: None  Radiology: No results found.   Procedures   Medications Ordered in the ED  sodium chloride  0.9 % bolus 1,000 mL (0 mLs  Intravenous Stopped 12/14/23 2237)                                    Medical Decision Making  78 yo F with a chief complaints of high blood pressure.  Patient has been particularly checking her blood pressure over the past few days after she had seen the ophthalmologist and they told her they still signs of high blood pressure in her eye.  She has seen her family doctor for this and they were planning to have her monitor at home and we will see her back in clinic in a couple weeks.  She had an episode today where she felt a bit lightheaded.  Seemed short-lived and resolved.  Her lab work with no acute anemia, UA negative for infection.  Hyponatremia hypochloremia.  Based on her history I think the patient has been pushing free water and is not intaking much of any salts.  Will give her a bolus of IV fluids here.  Will have her try to limit her free water intake.  Have her follow-up with her family doctor in clinic.  11:27 PM:  I have discussed the diagnosis/risks/treatment options with the patient.  Evaluation and diagnostic testing in the emergency department does not suggest an emergent condition requiring admission or immediate intervention beyond what has been performed at this time.  They will follow up with PCP. We also discussed returning to the ED immediately if new or worsening sx occur. We discussed the sx which are most concerning (e.g., sudden worsening pain, fever, inability to tolerate by mouth) that necessitate immediate return. Medications administered to the patient during their visit and any new prescriptions provided to the patient are  listed below.  Medications given during this visit Medications  sodium chloride  0.9 % bolus 1,000 mL (0 mLs Intravenous Stopped 12/14/23 2237)     The patient appears reasonably screen and/or stabilized for discharge and I doubt any other medical condition or other [facility] requiring further screening, evaluation, or treatment in the ED at this time prior to discharge.       Final diagnoses:  Hyponatremia    ED Discharge Orders     None          Emil Share, [place] 12/14/23 2327

## 2023-12-14 NOTE — Discharge Instructions (Signed)
 Please follow-up with your family doctor in the office.  I would have you continue monitoring your blood pressure but would not worry about it.  Please try to check it under the same circumstances every day.  Please return for sudden worsening headache confusion one-sided numbness or weakness or difficulty speech or swallowing. Return as well for chest pain or difficulty breathing or abdominal pain.

## 2023-12-14 NOTE — Telephone Encounter (Signed)
 Noted, I am not in clinic today.  Will await follow up note. Thanks.

## 2023-12-14 NOTE — ED Triage Notes (Signed)
 Patient states she was sent from Urgent Care for concerns of elevated blood pressure per member. Had EKG done at [facility] and they informed her she can go to ED for further eval due to limited to what they can do at [facility]. Patient has Afib and pacemaker. Patient denies any chest pain or discomfort. Denies blurred vision or headache. Denies pain 0/10. Patient was seen at her eye doctor on 10.31.2025 for regular check up and her blood pressure was elevated 161/80, and a blood vessel had ruptured in her left eye. Seen PCP on 11.03.2025 for follow up and BP check and he did not change any meds due to bp 146/75, told to keep a check on her bp.  Check this am prior going to UC and it was 172/77 and when she got to UC it was 160/74 and she had a EKG done. CBG 98 at UC.   Blood pressure is 150/66 at triage, rechecked 149/67.

## 2023-12-20 ENCOUNTER — Other Ambulatory Visit: Payer: Self-pay | Admitting: Family Medicine

## 2023-12-20 DIAGNOSIS — K589 Irritable bowel syndrome without diarrhea: Secondary | ICD-10-CM

## 2023-12-20 DIAGNOSIS — I10 Essential (primary) hypertension: Secondary | ICD-10-CM

## 2023-12-20 DIAGNOSIS — K582 Mixed irritable bowel syndrome: Secondary | ICD-10-CM

## 2023-12-22 ENCOUNTER — Ambulatory Visit (INDEPENDENT_AMBULATORY_CARE_PROVIDER_SITE_OTHER): Admitting: Family Medicine

## 2023-12-22 ENCOUNTER — Telehealth: Payer: Self-pay | Admitting: Family Medicine

## 2023-12-22 ENCOUNTER — Encounter: Payer: Self-pay | Admitting: Family Medicine

## 2023-12-22 VITALS — BP 134/76 | HR 61 | Temp 98.5°F | Ht 62.0 in | Wt 139.0 lb

## 2023-12-22 DIAGNOSIS — E871 Hypo-osmolality and hyponatremia: Secondary | ICD-10-CM | POA: Diagnosis not present

## 2023-12-22 DIAGNOSIS — K589 Irritable bowel syndrome without diarrhea: Secondary | ICD-10-CM

## 2023-12-22 DIAGNOSIS — K582 Mixed irritable bowel syndrome: Secondary | ICD-10-CM | POA: Diagnosis not present

## 2023-12-22 DIAGNOSIS — Z23 Encounter for immunization: Secondary | ICD-10-CM

## 2023-12-22 DIAGNOSIS — R350 Frequency of micturition: Secondary | ICD-10-CM | POA: Diagnosis not present

## 2023-12-22 DIAGNOSIS — R7989 Other specified abnormal findings of blood chemistry: Secondary | ICD-10-CM

## 2023-12-22 LAB — CBC WITH DIFFERENTIAL/PLATELET
Basophils Absolute: 0.1 K/uL (ref 0.0–0.1)
Basophils Relative: 0.8 % (ref 0.0–3.0)
Eosinophils Absolute: 0.2 K/uL (ref 0.0–0.7)
Eosinophils Relative: 2.5 % (ref 0.0–5.0)
HCT: 37.6 % (ref 36.0–46.0)
Hemoglobin: 12.9 g/dL (ref 12.0–15.0)
Lymphocytes Relative: 18.1 % (ref 12.0–46.0)
Lymphs Abs: 1.7 K/uL (ref 0.7–4.0)
MCHC: 34.4 g/dL (ref 30.0–36.0)
MCV: 93.4 fl (ref 78.0–100.0)
Monocytes Absolute: 0.8 K/uL (ref 0.1–1.0)
Monocytes Relative: 9 % (ref 3.0–12.0)
Neutro Abs: 6.4 K/uL (ref 1.4–7.7)
Neutrophils Relative %: 69.6 % (ref 43.0–77.0)
Platelets: 290 K/uL (ref 150.0–400.0)
RBC: 4.03 Mil/uL (ref 3.87–5.11)
RDW: 14.6 % (ref 11.5–15.5)
WBC: 9.2 K/uL (ref 4.0–10.5)

## 2023-12-22 LAB — BASIC METABOLIC PANEL WITH GFR
BUN: 13 mg/dL (ref 6–23)
CO2: 29 meq/L (ref 19–32)
Calcium: 9.8 mg/dL (ref 8.4–10.5)
Chloride: 100 meq/L (ref 96–112)
Creatinine, Ser: 0.71 mg/dL (ref 0.40–1.20)
GFR: 80.38 mL/min (ref >60.00)
Glucose, Bld: 91 mg/dL (ref 70–99)
Potassium: 3.6 meq/L (ref 3.5–5.1)
Sodium: 136 meq/L (ref 135–145)

## 2023-12-22 MED ORDER — DICYCLOMINE HCL 20 MG PO TABS: 20.0000 mg | ORAL_TABLET | Freq: Three times a day (TID) | ORAL | Status: DC | PRN

## 2023-12-22 NOTE — Patient Instructions (Signed)
 Go to the lab on the way out.   If you have mychart we'll likely use that to update you.    Take care.  Glad to see you. It would be okay to add a little salt to food.   I would limit water to 64 ounces a day unless your urine is dark yellow.

## 2023-12-22 NOTE — Progress Notes (Signed)
 ER follow up.   Had been taking more water and limiting salt intake.  Had low sodium level at ER.  BP controlled today.    She feels better today.    She had variable BP readings, SBP 120-160s on home checks.    She has had some urinary frequency, ucx pending.    Lower HGB noted.  No bloody or black stools.  Recheck pending.    Meds, vitals, and allergies reviewed.   ROS: Per HPI unless specifically indicated in ROS section   GEN: nad, alert and oriented HEENT: mucous membranes moist NECK: supple w/o LA CV: rrr PULM: ctab, no inc wob ABD: soft, +bs EXT: no edema SKIN: well perfused.   33 minutes were devoted to patient care in this encounter (this includes time spent reviewing the patient's file/history, interviewing and examining the patient, counseling/reviewing plan with patient).

## 2023-12-22 NOTE — Telephone Encounter (Signed)
 Right Knee is a little painfull and stiff and she forgot to have you look at it while she was in at her appt today.  Is asking for a call back to advise her what to do.

## 2023-12-23 LAB — URINE CULTURE
MICRO NUMBER:: 17231310
SPECIMEN QUALITY:: ADEQUATE

## 2023-12-23 NOTE — Telephone Encounter (Signed)
 Called patient states that in her exercise class Monday  she noticed some increased pain in right knee. She notices it more when walking. Denies any moment she did anything to it or any injury. At patient request we made appointment with Dr. Watt

## 2023-12-23 NOTE — Telephone Encounter (Signed)
 Left message to return call to our office.

## 2023-12-23 NOTE — Telephone Encounter (Signed)
 Noted. Thanks.

## 2023-12-25 ENCOUNTER — Ambulatory Visit: Payer: Self-pay | Admitting: Family Medicine

## 2023-12-25 DIAGNOSIS — R7989 Other specified abnormal findings of blood chemistry: Secondary | ICD-10-CM | POA: Insufficient documentation

## 2023-12-25 DIAGNOSIS — R350 Frequency of micturition: Secondary | ICD-10-CM | POA: Insufficient documentation

## 2023-12-25 NOTE — Progress Notes (Unsigned)
 Sarabeth Benton T. Zae Kirtz, MD, CAQ Sports Medicine [facility] at [facility] [address] [place], [address]  Phone: [phone]  FAX: [phone]  [name] - 78 y.o. female  MRN [id number]  Date of Birth: Oct 13, 1945  Date: 12/26/2023  PCP: Cleatus Arlyss RAMAN, MD  Referral: Cleatus Arlyss RAMAN, MD  No chief complaint on file.  Subjective:   [name] is a 78 y.o. very pleasant female patient with There is no height or weight on file to calculate BMI. who presents with the following:  Discussed the use of AI scribe software for clinical note transcription with the patient, who gave verbal consent to proceed.  Patient presents with ongoing right-sided knee pain.  I have seen her before for other issues.  Radiographs from March 23, 2023 of the right knee show moderate tricompartmental osteoarthritis. History of Present Illness     Review of Systems is noted in the HPI, as appropriate  Objective:   There were no vitals taken for this visit.  GEN: No acute distress; alert,appropriate. PULM: Breathing comfortably in no respiratory distress PSYCH: Normally interactive.   Laboratory and Imaging Data:  Assessment and Plan:     ICD-10-CM   1. Primary osteoarthritis of right knee  M17.11      Assessment & Plan   Medication Management during today's office visit: No orders of the defined types were placed in this encounter.  There are no discontinued medications.  Orders placed today for conditions managed today: No orders of the defined types were placed in this encounter.   Disposition: No follow-ups on file.  [employer] speech-to-text software was used for transcription in this dictation.  Possible transcriptional errors can occur using Animal nutritionist.   Signed,  Jacques DASEN. Ziona Wickens, MD   Outpatient Encounter Medications as of 12/26/2023  Medication Sig   acetaminophen  (TYLENOL ) 650 MG CR tablet Take 650 mg by  mouth every 8 (eight) hours as needed for pain.   albuterol  (VENTOLIN  HFA) 108 (90 Base) MCG/ACT inhaler Inhale 2 puffs into the lungs every 6 (six) hours as needed for wheezing or shortness of breath.   amLODipine  (NORVASC ) 10 MG tablet TAKE 1 TABLET BY MOUTH EVERY DAY   atorvastatin (LIPITOR) 20 MG tablet TAKE 1 TABLET BY MOUTH EVERY DAY   Azelastine  HCl 137 MCG/SPRAY SOLN Place 2 sprays into both nostrils daily.   clotrimazole  (MYCELEX ) 10 MG troche TAKE 1 TABLET (10 MG TOTAL) BY MOUTH 5 (FIVE) TIMES DAILY. REPEAT COURSE IF NEEDED   desonide (DESOWEN) 0.05 % ointment Apply 1 Application topically as needed.   dicyclomine  (BENTYL ) 20 MG tablet Take 1 tablet (20 mg total) by mouth 3 (three) times daily as needed (for cramping).   dorzolamide (TRUSOPT) 2 % ophthalmic solution SMARTSIG:In Eye(s)   fexofenadine (ALLEGRA) 180 MG tablet Take 180 mg by mouth daily.   FLUoxetine  (PROZAC ) 10 MG tablet TAKE 1 TABLET BY MOUTH EVERY DAY   GARLIC PO Take 1 tablet by mouth every other day.   guaiFENesin  (MUCINEX ) 600 MG 12 hr tablet Take 1 tablet (600 mg total) by mouth 2 (two) times daily as needed for to loosen phlegm or cough.   hydrocortisone 2.5 % cream Apply 1 Application topically 2 (two) times daily as needed (itching).   ipratropium (ATROVENT ) 0.06 % nasal spray Place 2 sprays into both nostrils daily.   latanoprost  (XALATAN ) 0.005 % ophthalmic solution Place 1 drop into both eyes at bedtime.  montelukast  (SINGULAIR ) 10 MG tablet TAKE 1 TABLET BY MOUTH DAILY AS DIRECTED   Multiple Vitamin (MULTIVITAMIN) tablet Take 1 tablet by mouth daily.   pantoprazole  (PROTONIX ) 40 MG tablet Take 1 tablet (40 mg total) by mouth daily.   Tiotropium Bromide-Olodaterol (STIOLTO RESPIMAT ) 2.5-2.5 MCG/ACT AERS Inhale 2 puffs into the lungs daily.   valsartan  (DIOVAN ) 160 MG tablet TAKE 1 TABLET BY MOUTH 2 TIMES DAILY.   vitamin B-12 (CYANOCOBALAMIN ) 1000 MCG tablet Take 1,000 mcg by mouth daily.   vitamin C  (ASCORBIC ACID ) 500 MG tablet Take 500 mg by mouth daily.   Zinc  50 MG CAPS Take 1 capsule by mouth daily.   No facility-administered encounter medications on file as of 12/26/2023.

## 2023-12-25 NOTE — Assessment & Plan Note (Signed)
 See notes on labs.

## 2023-12-25 NOTE — Assessment & Plan Note (Signed)
 It would be okay to add a little salt to food.   I would limit water to 64 ounces a day unless your urine is dark yellow.  See notes on labs.

## 2023-12-26 ENCOUNTER — Ambulatory Visit (INDEPENDENT_AMBULATORY_CARE_PROVIDER_SITE_OTHER): Admitting: Family Medicine

## 2023-12-26 ENCOUNTER — Encounter: Payer: Self-pay | Admitting: Family Medicine

## 2023-12-26 VITALS — BP 130/66 | HR 86 | Temp 97.5°F | Ht 62.0 in | Wt 138.4 lb

## 2023-12-26 DIAGNOSIS — M1711 Unilateral primary osteoarthritis, right knee: Secondary | ICD-10-CM | POA: Diagnosis not present

## 2023-12-26 DIAGNOSIS — M7051 Other bursitis of knee, right knee: Secondary | ICD-10-CM | POA: Diagnosis not present

## 2023-12-26 MED ORDER — TRIAMCINOLONE ACETONIDE 40 MG/ML IJ SUSP
40.0000 mg | Freq: Once | INTRAMUSCULAR | Status: AC
  Administered 2023-12-26: 40 mg via INTRA_ARTICULAR

## 2023-12-31 ENCOUNTER — Other Ambulatory Visit: Payer: Self-pay | Admitting: Family Medicine

## 2023-12-31 DIAGNOSIS — B370 Candidal stomatitis: Secondary | ICD-10-CM

## 2024-01-02 NOTE — Telephone Encounter (Signed)
 Clotrimazole  Last filled:  11/27/23, #70 troche Last OV:  12/22/23, HFU Next OV:  none

## 2024-01-17 ENCOUNTER — Encounter: Payer: Self-pay | Admitting: Pulmonary Disease

## 2024-01-17 ENCOUNTER — Ambulatory Visit: Admitting: Pulmonary Disease

## 2024-01-17 VITALS — BP 138/72 | HR 68 | Temp 97.8°F | Ht 64.0 in | Wt 135.6 lb

## 2024-01-17 DIAGNOSIS — R053 Chronic cough: Secondary | ICD-10-CM

## 2024-01-17 DIAGNOSIS — I10 Essential (primary) hypertension: Secondary | ICD-10-CM

## 2024-01-17 DIAGNOSIS — B370 Candidal stomatitis: Secondary | ICD-10-CM

## 2024-01-17 DIAGNOSIS — J4540 Moderate persistent asthma, uncomplicated: Secondary | ICD-10-CM | POA: Diagnosis not present

## 2024-01-17 DIAGNOSIS — J4530 Mild persistent asthma, uncomplicated: Secondary | ICD-10-CM

## 2024-01-17 NOTE — Progress Notes (Signed)
 "              [name]    [id number]    [birth year]  Primary Care Physician:Duncan, Arlyss RAMAN, MD  Referring Physician: Cleatus Arlyss RAMAN, MD [address] [place],  [place] [address]  Chief complaint: Follow-up for dyspnea, cough, asthma  HPI: 78 y.o. 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   Discussed the use of AI scribe software for clinical note transcription with the patient, who gave verbal consent to proceed.  The patient, with a history of hypertension, migraine, Raynaud's, asthma, irritable bowel syndrome, and chronic cough, presents for follow-up. She reports a chronic cough, thought to be secondary to asthma and postnasal drip, and bronchiolitis. The asthma is reportedly dormant, managed with a Stiolto inhaler. She has not had an asthma attack since her first one. She also reports a recent episode of thrush, thought to be secondary to inhaled corticosteroids. She occasionally uses an albuterol  rescue inhaler, typically when she rushes or climbs stairs too quickly. She also reports ongoing sinus congestion, with recent production of pale yellow to green sputum. She has been managing this with Nuvessa and a rinse with baby shampoo. She has a history of multiple sinus surgeries and has been told she no longer has allergies. She denies any exposure to mold, hot tubs, jacuzzis, or feather pillows or comforters at home. She has a pacemaker, placed a year ago.   Interval history Discussed the use of AI scribe software for clinical note transcription with the patient, who gave verbal consent to proceed.  History of Present Illness [name] is an 78 year old female with hypertension and  asthma who presents with fluctuating blood pressure and respiratory concerns.  Blood pressure fluctuations - Fluctuating blood pressure over the past month with systolic values up to 170 mmHg - Required two acute care visits for evaluation - Treated with IV saline for low sodium and chloride attributed to dehydration - Continues home blood pressure monitoring  Asthma and dyspnea - Chronic respiratory symptoms managed with Stiolto inhaler and montelukast  - Thrush developed from inhaled corticosteroids, managed with clotrimazole  as needed, limiting steroid use - Shortness of breath with fast walking or climbing stairs - No recent need for albuterol  - Breathing otherwise feels good  Chronic cough and upper airway symptoms - Chronic cough attributed to sinus congestion and postnasal drip - Montelukast  and azelastine  nasal spray used for symptom control - Recently switched from Allegra to Zyrtec  after running out of Allegra - Plans to resume Allegra due to better control of upper airway symptoms  Psychological stress - Coping with stress after husband's passing - Low dose Prozac  used, feels it is helpful   Relevant pulmonary history Pets: No pets Occupation: Used to work as an marketing executive Exposures: No mold,, Jacuzzi.  No feather pillows or comforter Smoking history: Never smoker Travel history: Previously lived in [place]  Relevant family history: No significant family history   Outpatient Encounter Medications as of 01/17/2024  Medication Sig   acetaminophen  (TYLENOL ) 650 MG CR tablet Take 650 mg by mouth every 8 (eight) hours as needed for pain.   amLODipine  (NORVASC ) 10 MG tablet TAKE 1 TABLET BY MOUTH EVERY DAY   atorvastatin (LIPITOR) 20 MG tablet TAKE 1 TABLET BY MOUTH EVERY  DAY   dicyclomine  (BENTYL ) 20 MG tablet Take 1 tablet (20 mg total) by mouth 3 (three) times daily as needed (for cramping).   dorzolamide (TRUSOPT) 2 % ophthalmic solution SMARTSIG:In Eye(s)    FLUoxetine  (PROZAC ) 10 MG tablet TAKE 1 TABLET BY MOUTH EVERY DAY   GARLIC PO Take 1 tablet by mouth every other day.   guaiFENesin  (MUCINEX ) 600 MG 12 hr tablet Take 1 tablet (600 mg total) by mouth 2 (two) times daily as needed for to loosen phlegm or cough.   hydrocortisone 2.5 % cream Apply 1 Application topically 2 (two) times daily as needed (itching).   ipratropium (ATROVENT ) 0.06 % nasal spray Place 2 sprays into both nostrils daily.   latanoprost  (XALATAN ) 0.005 % ophthalmic solution Place 1 drop into both eyes at bedtime.   montelukast  (SINGULAIR ) 10 MG tablet TAKE 1 TABLET BY MOUTH DAILY AS DIRECTED   Multiple Vitamin (MULTIVITAMIN) tablet Take 1 tablet by mouth daily.   pantoprazole  (PROTONIX ) 40 MG tablet Take 1 tablet (40 mg total) by mouth daily.   Tiotropium Bromide-Olodaterol (STIOLTO RESPIMAT ) 2.5-2.5 MCG/ACT AERS Inhale 2 puffs into the lungs daily.   valsartan  (DIOVAN ) 160 MG tablet TAKE 1 TABLET BY MOUTH 2 TIMES DAILY.   vitamin B-12 (CYANOCOBALAMIN ) 1000 MCG tablet Take 1,000 mcg by mouth daily.   vitamin C (ASCORBIC ACID ) 500 MG tablet Take 500 mg by mouth daily.   Zinc  50 MG CAPS Take 1 capsule by mouth daily.   albuterol  (VENTOLIN  HFA) 108 (90 Base) MCG/ACT inhaler Inhale 2 puffs into the lungs every 6 (six) hours as needed for wheezing or shortness of breath. (Patient not taking: Reported on 01/17/2024)   Azelastine  HCl 137 MCG/SPRAY SOLN Place 2 sprays into both nostrils daily. (Patient not taking: Reported on 01/17/2024)   clotrimazole  (MYCELEX ) 10 MG troche TAKE 1 TABLET (10 MG TOTAL) BY MOUTH 5 (FIVE) TIMES DAILY. REPEAT COURSE IF NEEDED   desonide (DESOWEN) 0.05 % ointment Apply 1 Application topically as needed.   fexofenadine (ALLEGRA) 180 MG tablet Take 180 mg by mouth daily. (Patient not taking: Reported on 01/17/2024)   No facility-administered encounter medications on file as of 01/17/2024.   Vitals:   01/17/24 1527  BP: 138/72  Pulse: 68  Temp: 97.8 F  (36.6 C)  Height: 5' 4 (1.626 m) Comment: per patient  Weight: 135 lb 9.6 oz (61.5 kg)  SpO2: 97% Comment: on RA  TempSrc: Oral  BMI (Calculated): 23.26     Physical Exam GEN: No acute distress. CV: Regular rate and rhythm, no murmurs. LUNGS: Clear to auscultation bilaterally, normal respiratory effort. SKIN JOINTS: Warm and dry, no rash.    Data Reviewed: Imaging: CT chest 06/17/2022-  Stable 6 mm nodule for 2 years. Diffuse bronchial thickening with subsegmental bronchiectasis, bronchial plugging, tree-in-bud interstitial consolidation in the right middle lobe and lower lobes.  I have reviewed the images personally.  PFTs: 09/26/2022 Moderate diffusion impairment  Labs: CBC 09/01/2022 WBC 9.0, eosinophils 1.7%, absolute eosinophil count 153 IgE 04/05/2022-2 CCP, rheumatoid factor 08/12/2022-negative  Assessment & Plan Moderate persistent asthma Asthma is well-controlled with no recent exacerbations. Current inhaler, Stiolto, is inappropriate for asthma management as it does not have an inhaled corticosteroid. Previous lung function tests did not show significant obstruction.  Thrush developed with inhaled corticosteroids. - Discontinued Stiolto inhaler. - Instructed to monitor symptoms after stopping Stiolto and report any issues via MyChart. - Continue montelukast  and azelastine  nasal spray. - Resume Allegra for allergy  management. - Will  consider reintroducing a low-dose steroid inhaler if symptoms worsen, with spacer and mouth rinsing to prevent thrush.  Chronic cough Likely due to upper airway cough syndrome from sinus congestion and postnasal drip. No significant obstruction on lung function tests. Symptoms managed with current medications. - Continue current medications including montelukast  and azelastine  nasal spray. - Resume Allegra for allergy  management.  Oral candidiasis Thrush associated with inhaled corticosteroid use. Managed with clotrimazole  as needed. -  Continue clotrimazole  as needed for thrush management.  Recommendations: Discontinue Stiolto and monitor symptoms Continue montelukast , azelastine  nasal spray Allegra  Lonna Coder MD [facility] 01/17/2024, 3:26 PM  CC: Cleatus Arlyss RAMAN, MD    "

## 2024-01-17 NOTE — Patient Instructions (Addendum)
  VISIT SUMMARY: Today, we addressed your fluctuating blood pressure, asthma management, chronic cough, and oral thrush. We made some changes to your medications and provided instructions for monitoring your symptoms.  YOUR PLAN: MODERATE PERSISTENT ASTHMA: Your asthma is well-controlled, but the current inhaler (Stiolto) is not suitable for asthma management. -Stop using the Stiolto inhaler. -Monitor your symptoms after stopping Stiolto and report any issues via MyChart. -Continue using montelukast  and azelastine  nasal spray. -Resume taking Allegra for allergy  management. -If your symptoms worsen, we may reintroduce a low-dose steroid inhaler with a spacer and mouth rinsing to prevent thrush.  CHRONIC COUGH: Your chronic cough is likely due to sinus congestion and postnasal drip. -Continue using montelukast  and azelastine  nasal spray. -Resume taking Allegra for better control of your upper airway symptoms.  ORAL CANDIDIASIS (THRUSH): You have thrush associated with inhaled corticosteroid use. -Continue using clotrimazole  as needed to manage thrush.

## 2024-01-26 ENCOUNTER — Other Ambulatory Visit: Payer: Self-pay | Admitting: Family Medicine

## 2024-01-26 DIAGNOSIS — E785 Hyperlipidemia, unspecified: Secondary | ICD-10-CM

## 2024-01-31 ENCOUNTER — Ambulatory Visit: Payer: Medicare Other

## 2024-01-31 DIAGNOSIS — I4430 Unspecified atrioventricular block: Secondary | ICD-10-CM | POA: Diagnosis not present

## 2024-01-31 LAB — CUP PACEART REMOTE DEVICE CHECK
Battery Remaining Longevity: 93 mo
Battery Remaining Percentage: 77 %
Battery Voltage: 3.01 V
Brady Statistic AP VP Percent: 2.5 %
Brady Statistic AP VS Percent: 45 %
Brady Statistic AS VP Percent: 5.6 %
Brady Statistic AS VS Percent: 47 %
Brady Statistic RA Percent Paced: 47 %
Brady Statistic RV Percent Paced: 8.1 %
Date Time Interrogation Session: 20251223153921
Implantable Lead Connection Status: 753985
Implantable Lead Connection Status: 753985
Implantable Lead Implant Date: 20230623
Implantable Lead Implant Date: 20230623
Implantable Lead Location: 753859
Implantable Lead Location: 753860
Implantable Pulse Generator Implant Date: 20230623
Lead Channel Impedance Value: 430 Ohm
Lead Channel Impedance Value: 430 Ohm
Lead Channel Pacing Threshold Amplitude: 0.5 V
Lead Channel Pacing Threshold Amplitude: 1.125 V
Lead Channel Pacing Threshold Pulse Width: 0.5 ms
Lead Channel Pacing Threshold Pulse Width: 0.5 ms
Lead Channel Sensing Intrinsic Amplitude: 12 mV
Lead Channel Sensing Intrinsic Amplitude: 2.3 mV
Lead Channel Setting Pacing Amplitude: 1.375
Lead Channel Setting Pacing Amplitude: 1.5 V
Lead Channel Setting Pacing Pulse Width: 0.5 ms
Lead Channel Setting Sensing Sensitivity: 2 mV
Pulse Gen Model: 2272
Pulse Gen Serial Number: 8092211

## 2024-02-01 NOTE — Progress Notes (Signed)
 Remote PPM Transmission

## 2024-02-06 LAB — OPHTHALMOLOGY REPORT-SCANNED

## 2024-02-07 ENCOUNTER — Ambulatory Visit: Payer: Self-pay | Admitting: Pulmonary Disease

## 2024-02-07 ENCOUNTER — Ambulatory Visit: Payer: Self-pay | Admitting: Cardiology

## 2024-02-07 NOTE — Telephone Encounter (Signed)
 FYI Only or Action Required?: FYI only for provider: appointment scheduled on 02/08/2024 at [facility] on [address] with [name] .  Patient is followed in Pulmonology for Mild persistent asthma, last seen on 01/17/2024 by [name], [name], MD.  Called Nurse Triage reporting Cough.  Symptoms began several weeks ago.  Interventions attempted: OTC medications: Delsym, Coricidin, , Prescription medications: Albuterol ,, Rescue inhaler, and Increased fluids/rest.  Symptoms are: unchanged.  Triage Disposition: See Physician Within 24 Hours  Patient/caregiver understands and will follow disposition?: Yes             Copied from CRM #[id number]. Topic: Clinical - Red Word Triage >> Feb 07, 2024 11:00 AM Donna [name] wrote: [name] that prompted transfer to Nurse Triage: Pt reports she has had [name] cough for the past 2-3 weeks and states it has been worsening, she also stated she was wheezing around Christmas time Reason for Disposition  [1] Continuous (nonstop) coughing interferes with work or school AND [2] no improvement using cough treatment per Care Advice  Answer Assessment - Initial Assessment Questions Barking cough for the past 3-4 weeks Sometimes she coughs up mucous but she states she has congestion in her chest. Patient was taking Delsym but it wasn't helping. Patient then started taking Coricidin.  Patient states she wants someone to listen to her lungs--She states when she talks too much she will start to having [name] coughing spell. Patient denies any difficulty breathing, chest pain, fevers.  Patient states she was told to stop the Stiolto inhaler and see how she does by [name] at the beginning of December at her last visit Patient has used her Albuterol  Rescue Inhaler-- Inhale 2 puffs into the lungs every 6 (six) hours as needed for wheezing or shortness of breath.  Patient states sometimes she coughs up some thick mucous--light pale with [name] little yellow  color  Offered patient an appointment today at the [facility] office but patient wanted to wait until tomorrow to see [name] pulmonary provider at her primary pulmonary office. Patient is advised to call us  back if anything changes or with any further questions/concerns. Patient is advised that if anything worsens to go to the Emergency Room or call 911. Patient verbalized understanding.    E2C2 Pulmonary Triage - Initial Assessment Questions  Have you tested for COVID or Flu? Note: If not, ask patient if [name] home test can be taken. If so, instruct patient to call back for positive results. No  MEDICINES:   Have you used any OTC meds to help with symptoms? Yes If yes, ask What medications? Delsym then Coricidin.  Have you used your inhalers/maintenance medication? Yes If yes, What medications? Albuterol  Inhaler-- Inhale 2 puffs into the lungs every 6 (six) hours as needed for wheezing or shortness of breath. Singulair --1 tablet daily  If inhaler, ask How many puffs and how often? Note: Review instructions on medication in the chart. Albuterol  Inhaler-- Inhale 2 puffs into the lungs every 6 (six) hours as needed for wheezing or shortness of breath.  OXYGEN: Do you wear supplemental oxygen? No If yes, How many liters are you supposed to use? N/a  Do you monitor your oxygen levels? No If yes, What is your reading (oxygen level) today? N/[name]    4. HEMOPTYSIS: Are you coughing up any blood? If Yes, ask: How much? (e.g., flecks, streaks, tablespoons, etc.)     denies 5. DIFFICULTY BREATHING: Are you having difficulty breathing? If Yes, ask: How bad is it? (e.g.,  mild, moderate, severe)      Denies anything new 6. FEVER: Do you have [name] fever? If Yes, ask: What is your temperature, how was it measured, and when did it start?     denies 7. CARDIAC HISTORY: Do you have any history of heart disease? (e.g., heart attack, congestive heart failure)       pacemaker 8. LUNG HISTORY: Do you have any history of lung disease?  (e.g., pulmonary embolus, asthma, emphysema)     ---- 9. PE RISK FACTORS: Do you have [name] history of blood clots? (or: recent major surgery, recent prolonged travel, bedridden)     ---- 10. OTHER SYMPTOMS: Do you have any other symptoms? (e.g., runny nose, wheezing, chest pain)       ----  Protocols used: Cough - Acute Non-Productive-[name]-AH

## 2024-02-08 ENCOUNTER — Ambulatory Visit

## 2024-02-08 VITALS — BP 136/72 | HR 80 | Temp 97.9°F | Wt 134.0 lb

## 2024-02-08 DIAGNOSIS — J0100 Acute maxillary sinusitis, unspecified: Secondary | ICD-10-CM | POA: Diagnosis not present

## 2024-02-08 DIAGNOSIS — J4530 Mild persistent asthma, uncomplicated: Secondary | ICD-10-CM

## 2024-02-08 LAB — POCT INFLUENZA A/B
Influenza A, POC: NEGATIVE
Influenza B, POC: NEGATIVE

## 2024-02-08 LAB — POC COVID19 BINAXNOW: SARS Coronavirus 2 Ag: POSITIVE — AB

## 2024-02-08 MED ORDER — FLUTICASONE-SALMETEROL 115-21 MCG/ACT IN AERO: 2.0000 | INHALATION_SPRAY | Freq: Two times a day (BID) | RESPIRATORY_TRACT | 12 refills | Status: DC

## 2024-02-08 MED ORDER — AMOXICILLIN-POT CLAVULANATE 875-125 MG PO TABS: 1.0000 | ORAL_TABLET | Freq: Two times a day (BID) | ORAL | 0 refills | Status: DC

## 2024-02-08 MED ORDER — SPACER/AERO-HOLDING CHAMBERS DEVI: 1.0000 [IU] | Freq: Once | 0 refills | Status: AC

## 2024-02-08 NOTE — Patient Instructions (Signed)
" °  VISIT SUMMARY: During your visit, we discussed your worsening congestion and respiratory symptoms, which have been affecting you for the past three months. We reviewed your history of using inhaled medications and the recent development of sinus pressure and nasal drainage. We also addressed your shortness of breath and its impact on your daily activities, including singing.  YOUR PLAN: -ACUTE MAXILLARY SINUSITIS: Acute maxillary sinusitis is an infection of the sinuses that causes symptoms like green nasal drainage and sinus pressure. We have prescribed Augmentin  for 10 days to treat the infection and ordered a chest x-ray to rule out pneumonia. Additionally, we tested you for flu and COVID to ensure a comprehensive evaluation.  -MILD PERSISTENT ASTHMA: Mild persistent asthma is a condition where the airways in your lungs are inflamed, causing breathing difficulties. Your symptoms worsened after stopping your inhaled steroid and Stiolto. We have prescribed a new inhaler with a spacer to use two puffs twice a day and instructed you to wash your mouth after using it to prevent thrush. Continue using albuterol  as needed. We provided you with a printout of these instructions.  INSTRUCTIONS: Please complete the 10-day course of Augmentin  as prescribed. Use your new inhaler with the spacer, two puffs twice a day, and remember to wash your mouth after each use. Continue using albuterol  as needed. Follow up with us  if your symptoms do not improve or if you experience any new or worsening symptoms. Attend the chest x-ray appointment and await the results of your flu and COVID tests.      Contains text generated by [facility].   "

## 2024-02-08 NOTE — Progress Notes (Signed)
 "   Subjective:   PATIENT ID: [name] GENDER: female DOB: April 04, 1945, MRN: [id number]   HPI Discussed the use of AI scribe software for clinical note transcription with the patient, who gave verbal consent to proceed.  History of Present Illness [name] is an 78 year old female with a history of pacemaker placement, mild asthma who presents with worsening congestion and respiratory symptoms.  She has been experiencing congestion for approximately three months, which has progressively worsened. Her symptoms intensified after discontinuing her inhaled steroid. Currently, she is using albuterol  as needed, which she finds insufficient for her symptoms.  She has a history of using Stiolto, which she stopped. During the time she was using both Stiolto and albuterol , she developed thrush and states that since then she has only been using albuterol . Patient appears to be slightly confused about her inhalers.  She reports nasal drainage, which is green in color, and sinus pressure that began about three days ago. She uses azelastine  nasal spray for her symptoms. No fever or chills, but she mentions occasional night sweats. Her appetite is stable, with a slight weight gain noted.  She has a pacemaker and feels more winded, requiring her to slow her pace during activities. She experiences shortness of breath when climbing stairs or walking quickly, impacting her ability to sing, a significant activity for her.  She has visited the ER three times in the past month due to high blood pressure but has not been prescribed antibiotics recently. She has a history of anxiety, particularly following the loss of her husband and son, and practices breathing exercises and yoga to manage her stress.     Past Medical History:  Diagnosis Date   Asthma    Glaucoma    HLD (hyperlipidemia)    HTN (hypertension) 08/01/2021   IBS (irritable bowel syndrome)    Osteoarthritis    Osteoporosis     Sinusitis    Status post placement of cardiac pacemaker 07/31/21 [employer]urity MRI dual-chamber pacemaker  08/01/2021   Symptomatic bradycardia 08/01/2021     Family History  Problem Relation Age of Onset   Stroke Mother    Hypertension Mother    Diabetes Father    Breast cancer Sister    Hypertension Sister    Hypertension Brother    Diabetes Brother    Diabetes Maternal Grandmother    Kidney failure Son    Heart disease Son        heart transplant   Heart disease Son        open heart surgery at 33 months old   Colon cancer Neg Hx      Social History   Socioeconomic History   Marital status: Widowed    Spouse name: Not on file   Number of children: 2   Years of education: Not on file   Highest education level: Associate degree: occupational, scientist, product/process development, or vocational program  Occupational History   Occupation: retired  Tobacco Use   Smoking status: Never    Passive exposure: Past   Smokeless tobacco: Never  Vaping Use   Vaping status: Never Used  Substance and Sexual Activity   Alcohol use: Not Currently   Drug use: No   Sexual activity: Not Currently  Other Topics Concern   Not on file  Social History Narrative   Widowed 2022.     Lived in [place]  for 50 years.     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   Social Drivers of Health   Tobacco Use: Low Risk (02/08/2024)   Patient History    Smoking Tobacco Use: Never    Smokeless Tobacco Use: Never    Passive Exposure: Past  Financial Resource Strain: Low Risk (10/28/2023)   Overall Financial Resource Strain (CARDIA)    Difficulty of Paying Living Expenses: Not hard at all  Food Insecurity: No Food Insecurity (10/28/2023)   Epic    Worried About Programme Researcher, Broadcasting/film/video in the Last Year: Never true    Ran Out of Food in the Last Year: Never true  Transportation Needs: No Transportation Needs (10/28/2023)   Epic    Lack of Transportation (Medical): No    Lack of Transportation  (Non-Medical): No  Physical Activity: Sufficiently Active (10/28/2023)   Exercise Vital Sign    Days of Exercise per Week: 3 days    Minutes of Exercise per Session: 50 min  Stress: No Stress Concern Present (10/28/2023)   [employer] of Occupational Health - Occupational Stress Questionnaire    Feeling of Stress: Not at all  Social Connections: Moderately Integrated (10/28/2023)   Social Connection and Isolation Panel    Frequency of Communication with Friends and Family: More than three times a week    Frequency of Social Gatherings with Friends and Family: More than three times a week    Attends Religious Services: More than 4 times per year    Active Member of [employer] or Organizations: Yes    Attends Banker Meetings: More than 4 times per year    Marital Status: Widowed  Intimate Partner Violence: Not At Risk (11/01/2023)   Epic    Fear of Current or Ex-Partner: No    Emotionally Abused: No    Physically Abused: No    Sexually Abused: No  Depression (PHQ2-9): Low Risk (12/22/2023)   Depression (PHQ2-9)    PHQ-2 Score: 0  Alcohol Screen: Low Risk (10/21/2022)   Alcohol Screen    Last Alcohol Screening Score (AUDIT): 0  Housing: Unknown (10/28/2023)   Epic    Unable to Pay for Housing in the Last Year: No    Number of Times Moved in the Last Year: Not on file    Homeless in the Last Year: No  Utilities: Not At Risk (11/01/2023)   Epic    Threatened with loss of utilities: No  Health Literacy: Adequate Health Literacy (11/01/2023)   B1300 Health Literacy    Frequency of need for help with medical instructions: Never     Allergies[1]   Outpatient Medications Prior to Visit  Medication Sig Dispense Refill   acetaminophen  (TYLENOL ) 650 MG CR tablet Take 650 mg by mouth every 8 (eight) hours as needed for pain.     albuterol  (VENTOLIN  HFA) 108 (90 Base) MCG/ACT inhaler Inhale 2 puffs into the lungs every 6 (six) hours as needed for wheezing or shortness of breath.  8 g 11   amLODipine  (NORVASC ) 10 MG tablet TAKE 1 TABLET BY MOUTH EVERY DAY 90 tablet 3   atorvastatin (LIPITOR) 20 MG tablet TAKE 1 TABLET BY MOUTH EVERY DAY 90 tablet 0   Azelastine  HCl 137 MCG/SPRAY SOLN Place 2 sprays into both nostrils daily. 30 mL 12   clotrimazole  (MYCELEX ) 10 MG troche TAKE 1 TABLET (10 MG TOTAL) BY MOUTH 5 (FIVE) TIMES DAILY. REPEAT COURSE IF NEEDED 70 Troche 0   desonide (DESOWEN) 0.05 % ointment Apply 1 Application topically as needed.     dicyclomine  (  BENTYL ) 20 MG tablet Take 1 tablet (20 mg total) by mouth 3 (three) times daily as needed (for cramping).     dorzolamide (TRUSOPT) 2 % ophthalmic solution SMARTSIG:In Eye(s)     fexofenadine (ALLEGRA) 180 MG tablet Take 180 mg by mouth daily.     FLUoxetine  (PROZAC ) 10 MG tablet TAKE 1 TABLET BY MOUTH EVERY DAY 90 tablet 3   GARLIC PO Take 1 tablet by mouth every other day.     hydrocortisone 2.5 % cream Apply 1 Application topically 2 (two) times daily as needed (itching).     ipratropium (ATROVENT ) 0.06 % nasal spray Place 2 sprays into both nostrils daily. 15 mL 12   latanoprost  (XALATAN ) 0.005 % ophthalmic solution Place 1 drop into both eyes at bedtime.     montelukast  (SINGULAIR ) 10 MG tablet TAKE 1 TABLET BY MOUTH DAILY AS DIRECTED 90 tablet 1   Multiple Vitamin (MULTIVITAMIN) tablet Take 1 tablet by mouth daily.     pantoprazole  (PROTONIX ) 40 MG tablet Take 1 tablet (40 mg total) by mouth daily. 90 tablet 4   valsartan  (DIOVAN ) 160 MG tablet TAKE 1 TABLET BY MOUTH 2 TIMES DAILY. 180 tablet 2   vitamin B-12 (CYANOCOBALAMIN ) 1000 MCG tablet Take 1,000 mcg by mouth daily.     vitamin C (ASCORBIC ACID ) 500 MG tablet Take 500 mg by mouth daily.     Zinc  50 MG CAPS Take 1 capsule by mouth daily.     guaiFENesin  (MUCINEX ) 600 MG 12 hr tablet Take 1 tablet (600 mg total) by mouth 2 (two) times daily as needed for to loosen phlegm or cough. (Patient not taking: Reported on 02/08/2024) 60 tablet 1   Tiotropium  Bromide-Olodaterol (STIOLTO RESPIMAT ) 2.5-2.5 MCG/ACT AERS Inhale 2 puffs into the lungs daily. (Patient not taking: Reported on 02/08/2024) 12 g 3   No facility-administered medications prior to visit.    ROS Reviewed all systems and reported negative except as above     Objective:   Vitals:   02/08/24 1135  BP: 136/72  Pulse: 80  Temp: 97.9 F (36.6 C)  SpO2: 97%  Weight: 134 lb (60.8 kg)    Physical Exam Physical Exam GENERAL: Appropriate to age, no acute distress. HEAD EYES EARS NOSE THROAT: Moist mucous membranes, atraumatic, normocephalic. CHEST: Clear to auscultation bilaterally, no wheezing, no crackles, no rales. CARDIAC: Regular rate and rhythm, normal S1, normal S2, no murmurs, no rubs, no gallops. ABDOMEN: Soft, nontender. NEUROLOGICAL: Motor and sensation grossly intact, alert and oriented times X 3. EXTREMITIES: Warm, well perfused, no edema.     CBC    Component Value Date/Time   WBC 9.2 12/22/2023 1243   RBC 4.03 12/22/2023 1243   HGB 12.9 12/22/2023 1243   HGB 12.1 03/04/2022 1246   HCT 37.6 12/22/2023 1243   HCT 36.2 03/04/2022 1246   PLT 290.0 12/22/2023 1243   PLT 262 03/04/2022 1246   MCV 93.4 12/22/2023 1243   MCV 91 03/04/2022 1246   MCH 31.3 12/14/2023 1532   MCHC 34.4 12/22/2023 1243   RDW 14.6 12/22/2023 1243   RDW 13.6 03/04/2022 1246   LYMPHSABS 1.7 12/22/2023 1243   LYMPHSABS 2.2 03/04/2022 1246   MONOABS 0.8 12/22/2023 1243   EOSABS 0.2 12/22/2023 1243   EOSABS 0.2 03/04/2022 1246   BASOSABS 0.1 12/22/2023 1243   BASOSABS 0.1 03/04/2022 1246     Results    PFT:    Latest Ref Rng & Units 09/15/2022    9:06 AM  PFT Results  FVC-Pre L 1.85   FVC-Predicted Pre % 68   FVC-Post L 1.90   FVC-Predicted Post % 69   Pre FEV1/FVC % % 69   Post FEV1/FCV % % 74   FEV1-Pre L 1.27   FEV1-Predicted Pre % 62   FEV1-Post L 1.40   DLCO uncorrected ml/min/mmHg 11.42   DLCO UNC% % 59   DLCO corrected ml/min/mmHg 11.88   DLCO  COR %Predicted % 61   DLVA Predicted % 100   TLC L 4.26   TLC % Predicted % 83   RV % Predicted % 97          Assessment & Plan:   Assessment and Plan Assessment & Plan Acute maxillary sinusitis Green nasal drainage and sinus pressure for three days, likely exacerbated by recent travel and environmental factors. Differential includes bacterial sinusitis.  - Ordered chest x-ray to rule out pneumonia. - Prescribed Augmentin  for 10 days. - Tested for flu and COVID.  Mild persistent asthma Worsening symptoms after discontinuation of inhaled steroid and Stiolto. Symptoms include congestion and difficulty with breathing, especially during physical activity and singing. Pacemaker in place, contributing to increased breathlessness. - Prescribed new advair MDI with spacer, two puffs twice a day. - Instructed to wash mouth after using inhaler to prevent thrush. - Continue albuterol  as needed. - Provided printout with instructions.        Zola Herter, MD [facility] Office: [phone]        [1]  Allergies Allergen Reactions   Azithromycin     Other reaction(s): gi distress, GI intolerance, Other   Hydrochlorothiazide Other (See Comments)    Not a allergy , but hyponatremia induced seizure with HCTZ 2022   Levofloxacin Hives and Rash   "

## 2024-02-08 NOTE — Addendum Note (Signed)
 Addended byBETHA FRIES, Nomie Buchberger A on: 02/08/2024 12:09 PM   Modules accepted: Orders

## 2024-02-10 ENCOUNTER — Telehealth: Payer: Self-pay

## 2024-02-10 NOTE — Telephone Encounter (Signed)
 Copied from CRM #[id number]. Topic: Clinical - Prescription Issue >> Feb 10, 2024  9:25 AM Isabell A wrote: Reason for CRM: Reason for CRM: Patient is requesting to speak with Dr.Hattars nurse in regard to the inhalers she was prescribed.  States she has not received her inhalers & the Albuterol  doesn't fit on the equipment she received.  Callback number: [phone]   Called and spoke with the pt and advised Advair inhaler was sent to pharmacy at [facility] 12/31.I also instructed pt over the phone how to use her spacer. Pt will contact pharmacy. NFN

## 2024-02-14 ENCOUNTER — Other Ambulatory Visit: Payer: Self-pay | Admitting: Family Medicine

## 2024-02-14 DIAGNOSIS — B370 Candidal stomatitis: Secondary | ICD-10-CM

## 2024-02-15 ENCOUNTER — Other Ambulatory Visit: Payer: Self-pay | Admitting: Family Medicine

## 2024-02-15 DIAGNOSIS — B370 Candidal stomatitis: Secondary | ICD-10-CM

## 2024-02-15 MED ORDER — CLOTRIMAZOLE 10 MG MT TROC: 10.0000 mg | Freq: Every day | OROMUCOSAL | 1 refills | Status: AC

## 2024-02-15 NOTE — Telephone Encounter (Signed)
 Copied from CRM #[id number]. Topic: Clinical - Medication Refill >> Feb 15, 2024 11:23 AM Donna BRAVO wrote: Medication:  clotrimazole  (MYCELEX ) 10 MG troche and is asking for several refills  for thrush  Has the patient contacted their pharmacy? No No refills  This is the patient's preferred pharmacy:   [id number] [address] [facility] - [address] [place] [address] Phone: [phone] Fax: [phone]  Is this the correct pharmacy for this prescription? Yes If no, delete pharmacy and type the correct one.   Has the prescription been filled recently? No  Is the patient out of the medication? Yes  Has the patient been seen for an appointment in the last year OR does the patient have an upcoming appointment? Yes  Can we respond through MyChart? Yes  Agent: Please be advised that Rx refills may take up to 3 business days. We ask that you follow-up with your pharmacy.

## 2024-02-20 ENCOUNTER — Other Ambulatory Visit: Payer: Self-pay

## 2024-02-20 ENCOUNTER — Other Ambulatory Visit: Payer: Self-pay | Admitting: Family Medicine

## 2024-02-20 DIAGNOSIS — J4541 Moderate persistent asthma with (acute) exacerbation: Secondary | ICD-10-CM

## 2024-02-20 DIAGNOSIS — I10 Essential (primary) hypertension: Secondary | ICD-10-CM

## 2024-02-20 DIAGNOSIS — K589 Irritable bowel syndrome without diarrhea: Secondary | ICD-10-CM

## 2024-02-20 DIAGNOSIS — E785 Hyperlipidemia, unspecified: Secondary | ICD-10-CM

## 2024-02-20 DIAGNOSIS — K582 Mixed irritable bowel syndrome: Secondary | ICD-10-CM

## 2024-02-20 DIAGNOSIS — F419 Anxiety disorder, unspecified: Secondary | ICD-10-CM

## 2024-02-20 DIAGNOSIS — K219 Gastro-esophageal reflux disease without esophagitis: Secondary | ICD-10-CM

## 2024-02-21 ENCOUNTER — Other Ambulatory Visit: Payer: Self-pay | Admitting: Family Medicine

## 2024-02-21 DIAGNOSIS — J4541 Moderate persistent asthma with (acute) exacerbation: Secondary | ICD-10-CM

## 2024-02-23 ENCOUNTER — Other Ambulatory Visit: Payer: Self-pay

## 2024-02-23 ENCOUNTER — Ambulatory Visit: Payer: Self-pay

## 2024-02-23 DIAGNOSIS — J4530 Mild persistent asthma, uncomplicated: Secondary | ICD-10-CM

## 2024-02-23 MED ORDER — FLUTICASONE-SALMETEROL 115-21 MCG/ACT IN AERO: 2.0000 | INHALATION_SPRAY | Freq: Two times a day (BID) | RESPIRATORY_TRACT | 3 refills | Status: AC

## 2024-02-23 NOTE — Telephone Encounter (Signed)
 Thank you for getting patient scheduled.  Appreciate help from Dr. Avelina.

## 2024-02-23 NOTE — Telephone Encounter (Signed)
 FYI Only or Action Required?: FYI only for provider: appointment scheduled on 1/16.  Patient also requesting prescription for humidifier if possible to assist with financial coverage.   Patient was last seen in primary care on 12/26/2023 by Watt Mirza, MD.  Called Nurse Triage reporting Cough.  Symptoms began several weeks ago.  Interventions attempted: OTC medications: Mucinex  Cough, Delsym, Prescription medications: Augmentin - completed course, and Rest, hydration, or home remedies.  Symptoms are: stable.  Triage Disposition: See PCP When Office is Open (Within 3 Days)  Patient/caregiver understands and will follow disposition?: Yes   Patient requested Pulm office also be notified as preferred to see pulm.  Curious if could squeeze in for visit tomorrow. Attempted to reach [facility] CAL @1612  and 1613 ET, no answer.   Sent message to Pumonary Nurse Triage Chat: Unable to reach [facility], no answer x2 , please see triage note for MRN [id number]; no pulm appts available tomorrow thus scheduled with PCP.  Patient wanted pulm provider to be aware.  Thanks!      Copied from CRM [id number]. Topic: Clinical - Red Word Triage >> Feb 23, 2024  3:34 PM Larissa S wrote: [name] that prompted transfer to Nurse Triage: cough w/ mucous for several weeks     Reason for Disposition  [1] Taking antibiotic > 7 days AND [2] nasal discharge not improved  Answer Assessment - Initial Assessment Questions EXPOSURES: Reports living in an older house with old furnace.  Air is very dry.  Denies any visible mold growth however had pump installed to basement three years ago, possibly due to water in basement. Has not done any HVAC testing or dust testing of home for residual mold or hidden, active mold presence.  Patient reports no known testing for fungal colonization or mycotoxin exposure.     1. ANTIBIOTIC: What antibiotic are you taking? How many times a day?      Finished over this past weekend, completed full course. First saw Pulmonology provider for this. COVID/ pneumonia/ bronchitis, chest xray reportedly negative.   2. ONSET: When was the antibiotic started?     Augmentin  prescribed 12/31   3. PAIN: How bad is the pain?   (Scale 0-10; or none, mild, moderate or severe)     Sinus pain/pressure- 8/10 from time to time face/eyes, not current during call. Taking other prescribed pain medication.  Has not yet purchased humidifier, unsure of warm or cool mist. Pulmonary staff gave new inhalers to use, using apparatus with inhaler. Wonders if can have prescription for humidifier to help with cost.   4. FEVER: Do you have a fever? If Yes, ask: What is it, how was it measured, and when did it start?      Denies recent fever or chills.   5. SYMPTOMS: Are there any other symptoms you're concerned about? If Yes, ask: When did it start?     Not as much color to mucus now so does think Augmentin  helped somewhat. Having some wheezing time to time, not constant. History of asthma noted on chart, wheezing sometimes with cold air. Drainage improved but does remain.   Cough- lingering, also still having drainage out of nose. Still has green drainage in AM.   Throat remains itchy. Is staying hydrated, using Pedialyte.   New insurance.  Not able to take medicine for sinus congestion.   Using mucinex  cough medicine and switching to Delsym.  Still has a deep cough.  Not going to church.   Takes  Singulair .  Patient would still like information passed onto Pulmonologist and would prefer to see them if they can fit her in tomorrow (no current availability). Otherwise will proceed with PCP office visit that has been scheduled tomorrow.  Protocols used: Sinus Infection on Antibiotic Follow-up Call-A-AH

## 2024-02-24 ENCOUNTER — Other Ambulatory Visit: Payer: Self-pay | Admitting: Family Medicine

## 2024-02-24 ENCOUNTER — Encounter: Payer: Self-pay | Admitting: Family Medicine

## 2024-02-24 ENCOUNTER — Ambulatory Visit: Admitting: Family Medicine

## 2024-02-24 VITALS — BP 120/60 | HR 76 | Temp 97.1°F | Ht 62.0 in | Wt 136.5 lb

## 2024-02-24 DIAGNOSIS — R051 Acute cough: Secondary | ICD-10-CM | POA: Diagnosis not present

## 2024-02-24 DIAGNOSIS — B370 Candidal stomatitis: Secondary | ICD-10-CM

## 2024-02-24 DIAGNOSIS — J4541 Moderate persistent asthma with (acute) exacerbation: Secondary | ICD-10-CM

## 2024-02-24 DIAGNOSIS — R9389 Abnormal findings on diagnostic imaging of other specified body structures: Secondary | ICD-10-CM | POA: Diagnosis not present

## 2024-02-24 MED ORDER — BENZONATATE 200 MG PO CAPS: 200.0000 mg | ORAL_CAPSULE | Freq: Two times a day (BID) | ORAL | 0 refills | Status: DC | PRN

## 2024-02-24 NOTE — Progress Notes (Signed)
 "   Patient ID: [name], female    DOB: 12/09/1945, 79 y.o.   MRN: [id number]  This visit was conducted in person.  BP 120/60   Pulse 76   Temp (!) 97.1 F (36.2 C) (Temporal)   Ht 5' 2 (1.575 m)   Wt 136 lb 8 oz (61.9 kg)   SpO2 96%   BMI 24.97 kg/m    CC:  Chief Complaint  Patient presents with   Cough    Ongoing-Finished Antibiotics Augmentin  prescribed on 02/08/24 at Urgent Care Visit   Nasal Congestion    Subjective:   HPI: [name] is a 79 y.o. female presenting on 02/24/2024 for Cough (Ongoing-Finished Antibiotics Augmentin  prescribed on 02/08/24 at Urgent Care Visit) and Nasal Congestion  Recent OV on 02/08/2024 Treated by Pulmonary with Augmentin  for sinusitis  COVID initially faint positive.. retest negative. CXR 02/08/2024 IMPRESSION: 1. Retrocardiac opacity suspicious for pneumonia. Follow-up chest radiograph is recommended in 3-4 weeks, following conservative therapy, to document resolution. If persistent at that time, dedicated contrast enhanced CT imaging of the chest is recommended for further evaluation. 2. 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  Hx of mild persistent asthma Started on Advair... started in last week.    Today she reports continued cough and nasal congestion.  Mucus is no longer thick and green.  Using saline rinse.  Minimal SOB.  Occ wheeze... has not required the albuterol .  No Fever.  No ST, no ear pain.     Relevant past medical, surgical, family and social history reviewed and updated as indicated. Interim medical history since our last visit reviewed. Allergies and medications reviewed and updated. Outpatient Medications Prior to Visit  Medication Sig Dispense Refill   acetaminophen  (TYLENOL ) 650 MG CR tablet Take 650 mg by mouth every 8 (eight) hours as needed for pain.     albuterol  (VENTOLIN  HFA) 108 (90 Base) MCG/ACT inhaler INHALE 2 PUFFS BY MOUTH  EVERY 4 TO 6 HOURS AS NEEDED *NEW PRESCRIPTION REQUEST* 18 g 10   amLODipine  (NORVASC ) 10 MG tablet TAKE 1 TABLET BY MOUTH EVERY DAY *NEW PRESCRIPTION REQUEST* 90 tablet 0   atorvastatin  (LIPITOR) 20 MG tablet TAKE 1 TABLET BY MOUTH EVERY DAY *NEW PRESCRIPTION REQUEST* 90 tablet 0   Azelastine  HCl 137 MCG/SPRAY SOLN Place 2 sprays into both nostrils daily. 30 mL 12   clotrimazole  (MYCELEX ) 10 MG troche Take 1 tablet (10 mg total) by mouth 5 (five) times daily. Repeat course if needed 70 Troche 1   desonide (DESOWEN) 0.05 % ointment Apply 1 Application topically as needed.     dicyclomine  (BENTYL ) 20 MG tablet TAKE 1 TABLET BY MOUTH THREE TIMES DAILY AS NEEDED *NEW PRESCRIPTION REQUEST* 270 tablet 0   dorzolamide (TRUSOPT) 2 % ophthalmic solution SMARTSIG:In Eye(s)     fexofenadine (ALLEGRA) 180 MG tablet Take 180 mg by mouth daily.     finasteride (PROSCAR) 5 MG tablet Take 2.5 mg by mouth daily.     FLUoxetine  (PROZAC ) 10 MG tablet TAKE 1 TABLET BY MOUTH EVERY DAY *NEW PRESCRIPTION REQUEST* 90 tablet 0   fluticasone -salmeterol (ADVAIR HFA) 115-21 MCG/ACT inhaler Inhale 2 puffs into the lungs 2 (two) times daily. 3 each 3   GARLIC PO Take 1 tablet by mouth every other day.     hydrocortisone 2.5 % cream Apply 1 Application topically 2 (two) times daily as needed (itching).     ipratropium (ATROVENT ) 0.06 % nasal  spray Place 2 sprays into both nostrils daily. 15 mL 12   latanoprost  (XALATAN ) 0.005 % ophthalmic solution Place 1 drop into both eyes at bedtime.     montelukast  (SINGULAIR ) 10 MG tablet TAKE 1 TABLET BY MOUTH DAILY AS DIRECTED *NEW PRESCRIPTION REQUEST* 90 tablet 0   Multiple Vitamin (MULTIVITAMIN) tablet Take 1 tablet by mouth daily.     pantoprazole  (PROTONIX ) 40 MG tablet TAKE 1 TABLET BY MOUTH EVERY DAY *NEW PRESCRIPTION REQUEST* 90 tablet 0   valsartan  (DIOVAN ) 160 MG tablet TAKE ONE (1) TABLET BY MOUTH TWICE DAILY *NEW PRESCRIPTION REQUEST* 180 tablet 0   vitamin B-12  (CYANOCOBALAMIN ) 1000 MCG tablet Take 1,000 mcg by mouth daily.     vitamin C (ASCORBIC ACID ) 500 MG tablet Take 500 mg by mouth daily.     Zinc  50 MG CAPS Take 1 capsule by mouth daily.     amoxicillin -clavulanate (AUGMENTIN ) 875-125 MG tablet Take 1 tablet by mouth 2 (two) times daily. 20 tablet 0   guaiFENesin  (MUCINEX ) 600 MG 12 hr tablet Take 1 tablet (600 mg total) by mouth 2 (two) times daily as needed for to loosen phlegm or cough. (Patient not taking: Reported on 02/08/2024) 60 tablet 1   No facility-administered medications prior to visit.     Per HPI unless specifically indicated in ROS section below Review of Systems  Constitutional:  Negative for fatigue and fever.  HENT:  Positive for congestion.   Eyes:  Negative for pain.  Respiratory:  Positive for cough. Negative for shortness of breath.   Cardiovascular:  Negative for chest pain, palpitations and leg swelling.  Gastrointestinal:  Negative for abdominal pain.  Genitourinary:  Negative for dysuria and vaginal bleeding.  Musculoskeletal:  Negative for back pain.  Neurological:  Negative for syncope, light-headedness and headaches.  Psychiatric/Behavioral:  Negative for dysphoric mood.    Objective:  BP 120/60   Pulse 76   Temp (!) 97.1 F (36.2 C) (Temporal)   Ht 5' 2 (1.575 m)   Wt 136 lb 8 oz (61.9 kg)   SpO2 96%   BMI 24.97 kg/m   Wt Readings from Last 3 Encounters:  02/24/24 136 lb 8 oz (61.9 kg)  02/08/24 134 lb (60.8 kg)  01/17/24 135 lb 9.6 oz (61.5 kg)      Physical Exam Constitutional:      General: She is not in acute distress.    Appearance: Normal appearance. She is well-developed. She is not ill-appearing or toxic-appearing.  HENT:     Head: Normocephalic.     Right Ear: Hearing, tympanic membrane, ear canal and external ear normal. Tympanic membrane is not erythematous, retracted or bulging.     Left Ear: Hearing, tympanic membrane, ear canal and external ear normal. Tympanic membrane is not  erythematous, retracted or bulging.     Nose: No mucosal edema or rhinorrhea.     Right Sinus: No maxillary sinus tenderness or frontal sinus tenderness.     Left Sinus: No maxillary sinus tenderness or frontal sinus tenderness.     Mouth/Throat:     Pharynx: Uvula midline.  Eyes:     General: Lids are normal. Lids are everted, no foreign bodies appreciated.     Conjunctiva/sclera: Conjunctivae normal.     Pupils: Pupils are equal, round, and reactive to light.  Neck:     Thyroid: No thyroid mass or thyromegaly.     Vascular: No carotid bruit.     Trachea: Trachea normal.  Cardiovascular:  Rate and Rhythm: Normal rate and regular rhythm.     Pulses: Normal pulses.     Heart sounds: Normal heart sounds, S1 normal and S2 normal. No murmur heard.    No friction rub. No gallop.  Pulmonary:     Effort: Pulmonary effort is normal. No tachypnea or respiratory distress.     Breath sounds: Normal breath sounds. No decreased breath sounds, wheezing, rhonchi or rales.  Abdominal:     General: Bowel sounds are normal.     Palpations: Abdomen is soft.     Tenderness: There is no abdominal tenderness.  Musculoskeletal:     Cervical back: Normal range of motion and neck supple.  Skin:    General: Skin is warm and dry.     Findings: No rash.  Neurological:     Mental Status: She is alert.  Psychiatric:        Mood and Affect: Mood is not anxious or depressed.        Speech: Speech normal.        Behavior: Behavior normal. Behavior is cooperative.        Thought Content: Thought content normal.        Judgment: Judgment normal.       Results for orders placed or performed in visit on 02/08/24  POC COVID-19   Collection Time: 02/08/24 12:02 PM  Result Value Ref Range   SARS Coronavirus 2 Ag Positive (A) Negative  POCT Influenza A/B   Collection Time: 02/08/24 12:02 PM  Result Value Ref Range   Influenza A, POC Negative Negative   Influenza B, POC Negative Negative    Assessment  and Plan  Abnormal chest x-ray Assessment & Plan: Noted at pulmonary per chest x-ray report.  No follow-up set up although recommended.  Patient will return to our clinic for repeat chest x-ray to assure resolution of changes seen in 4 weeks after initial x-ray.  Lung exam clear in office today.  Orders: -     DG Chest 2 View; Future  Acute cough Assessment & Plan: Acute but overall improvement and clearance of mucus following course of Augmentin .  No clear continued viral or bacterial infection but most likely postinfectious cough and sinus inflammation. Continue symptomatic care with Mucinex .  Will prescribe benzonatate  200 mg twice daily as needed for cough. Continue nasal saline irrigation.  Return and ER precautions provided.   Other orders -     Benzonatate ; Take 1 capsule (200 mg total) by mouth 2 (two) times daily as needed for cough.  Dispense: 20 capsule; Refill: 0    Return in about 2 weeks (around 03/09/2024) for  CXR only, no appt with MD.   Greig Ring, MD  "

## 2024-02-25 DIAGNOSIS — R9389 Abnormal findings on diagnostic imaging of other specified body structures: Secondary | ICD-10-CM | POA: Insufficient documentation

## 2024-02-25 NOTE — Assessment & Plan Note (Signed)
 Noted at pulmonary per chest x-ray report.  No follow-up set up although recommended.  Patient will return to our clinic for repeat chest x-ray to assure resolution of changes seen in 4 weeks after initial x-ray.  Lung exam clear in office today.

## 2024-02-25 NOTE — Assessment & Plan Note (Signed)
 Acute but overall improvement and clearance of mucus following course of Augmentin .  No clear continued viral or bacterial infection but most likely postinfectious cough and sinus inflammation. Continue symptomatic care with Mucinex .  Will prescribe benzonatate  200 mg twice daily as needed for cough. Continue nasal saline irrigation.  Return and ER precautions provided.

## 2024-02-27 ENCOUNTER — Other Ambulatory Visit: Payer: Self-pay | Admitting: Family Medicine

## 2024-02-27 DIAGNOSIS — K582 Mixed irritable bowel syndrome: Secondary | ICD-10-CM

## 2024-02-27 DIAGNOSIS — F419 Anxiety disorder, unspecified: Secondary | ICD-10-CM

## 2024-02-27 DIAGNOSIS — K589 Irritable bowel syndrome without diarrhea: Secondary | ICD-10-CM

## 2024-02-27 DIAGNOSIS — E785 Hyperlipidemia, unspecified: Secondary | ICD-10-CM

## 2024-02-27 DIAGNOSIS — I10 Essential (primary) hypertension: Secondary | ICD-10-CM

## 2024-02-27 DIAGNOSIS — J4541 Moderate persistent asthma with (acute) exacerbation: Secondary | ICD-10-CM

## 2024-02-27 DIAGNOSIS — K219 Gastro-esophageal reflux disease without esophagitis: Secondary | ICD-10-CM

## 2024-02-27 MED ORDER — VALSARTAN 160 MG PO TABS: 160.0000 mg | ORAL_TABLET | Freq: Two times a day (BID) | ORAL | 3 refills | Status: AC

## 2024-02-27 MED ORDER — FLUOXETINE HCL 10 MG PO TABS: 10.0000 mg | ORAL_TABLET | Freq: Every day | ORAL | 3 refills | Status: AC

## 2024-02-27 MED ORDER — IPRATROPIUM BROMIDE 0.06 % NA SOLN: 2.0000 | Freq: Every day | NASAL | 12 refills | Status: AC

## 2024-02-27 MED ORDER — AMLODIPINE BESYLATE 10 MG PO TABS: 10.0000 mg | ORAL_TABLET | Freq: Every day | ORAL | 3 refills | Status: AC

## 2024-02-27 MED ORDER — MONTELUKAST SODIUM 10 MG PO TABS: 10.0000 mg | ORAL_TABLET | Freq: Every day | ORAL | 3 refills | Status: DC

## 2024-02-27 MED ORDER — AZELASTINE HCL 137 MCG/SPRAY NA SOLN: 2.0000 | Freq: Every day | NASAL | 12 refills | Status: AC

## 2024-02-27 MED ORDER — ATORVASTATIN CALCIUM 20 MG PO TABS: 20.0000 mg | ORAL_TABLET | Freq: Every day | ORAL | 3 refills | Status: AC

## 2024-02-27 MED ORDER — DICYCLOMINE HCL 20 MG PO TABS: 20.0000 mg | ORAL_TABLET | Freq: Three times a day (TID) | ORAL | 3 refills | Status: AC

## 2024-02-27 MED ORDER — PANTOPRAZOLE SODIUM 40 MG PO TBEC: 40.0000 mg | DELAYED_RELEASE_TABLET | Freq: Every day | ORAL | 3 refills | Status: AC

## 2024-02-28 ENCOUNTER — Telehealth: Payer: Self-pay

## 2024-02-28 MED ORDER — SPACER/AERO-HOLDING CHAMBERS DEVI: 1.0000 | 0 refills | Status: AC

## 2024-02-28 NOTE — Telephone Encounter (Signed)
 Copied from CRM [id number]. Topic: Clinical - Medication Refill >> Feb 24, 2024  2:21 PM Chantha C wrote: Medication:  albuterol  (VENTOLIN  HFA) 108 (90 Base) MCG/ACT inhaler and fluticasone -salmeterol (ADVAIR HFA) 115-21 MCG/ACT inhaler.  Has the patient contacted their pharmacy? Duwaine from [facility] pharmacy [phone] is confirming if patient is active with Dr. Zola Herter and asking for refill request for albuterol  (VENTOLIN  HFA) 108 (90 Base) MCG/ACT inhaler and fluticasone -salmeterol (ADVAIR HFA) 115-21 MCG/ACT inhaler. Duwaine is unsure if patient is out of medication.  (Agent: If no, request that the patient contact the pharmacy for the refill. If patient does not wish to contact the pharmacy document the reason why and proceed with request.) (Agent: If yes, when and what did the pharmacy advise?)  This is the patient's preferred pharmacy:  [facility], [place] - [address] [place] [address] Phone: [phone] Fax: [phone]   Is this the correct pharmacy for this prescription? Yes If no, delete pharmacy and type the correct one.   Has the prescription been filled recently? No  Is the patient out of the medication? unsure  Has the patient been seen for an appointment in the last year OR does the patient have an upcoming appointment? Yes  Can we respond through MyChart?   Agent: Please be advised that Rx refills may take up to 3 business days. We ask that you follow-up with your pharmacy.    Megan from [facility] pharmacy   everything is updated --  -NFN

## 2024-03-03 ENCOUNTER — Other Ambulatory Visit: Payer: Self-pay | Admitting: Family Medicine

## 2024-03-05 NOTE — Telephone Encounter (Signed)
 Last office visit 02/24/2024 with Dr. Avelina for cough/nasal congestion.  Last refilled 02/24/24 for #20 with no refills. Next Appt; No future appointments with PCP or Dr. Avelina.

## 2024-03-08 ENCOUNTER — Telehealth: Payer: Self-pay | Admitting: Family Medicine

## 2024-03-08 ENCOUNTER — Other Ambulatory Visit: Payer: Medicare (Managed Care)

## 2024-03-08 ENCOUNTER — Ambulatory Visit
Admission: RE | Admit: 2024-03-08 | Discharge: 2024-03-08 | Disposition: A | Discharge status: Home / Self Care | Payer: Medicare (Managed Care) | Source: Ambulatory Visit | Attending: Family Medicine | Admitting: Family Medicine

## 2024-03-08 DIAGNOSIS — R9389 Abnormal findings on diagnostic imaging of other specified body structures: Secondary | ICD-10-CM | POA: Diagnosis not present

## 2024-03-08 DIAGNOSIS — J4541 Moderate persistent asthma with (acute) exacerbation: Secondary | ICD-10-CM

## 2024-03-08 NOTE — Telephone Encounter (Signed)
 Prescription Request  03/08/2024  LOV: 12/22/2023  What is the name of the medication or equipment?   montelukast  (SINGULAIR ) 10 MG tablet   Have you contacted your pharmacy to request a refill? Yes   Which pharmacy would you like this sent to?   [id number] [address], [place] - [address] [place] [address] Phone: [phone] Fax: [phone]   Patient notified that their request is being sent to the clinical staff for review and that they should receive a response within 2 business days.   Please advise at Mobile [phone] (mobile)

## 2024-03-09 ENCOUNTER — Other Ambulatory Visit: Payer: Medicare (Managed Care)

## 2024-03-09 MED ORDER — MONTELUKAST SODIUM 10 MG PO TABS: 10.0000 mg | ORAL_TABLET | Freq: Every day | ORAL | 3 refills | Status: AC

## 2024-03-14 ENCOUNTER — Ambulatory Visit: Payer: Self-pay | Admitting: Family Medicine

## 2024-11-01 ENCOUNTER — Ambulatory Visit

## 2024-11-02 ENCOUNTER — Ambulatory Visit
